# Patient Record
Sex: Male | Born: 1935 | Race: White | Hispanic: No | Marital: Married | Smoking: Never smoker
Health system: Southern US, Community
[De-identification: ages and names within clinical notes are randomized; demographics above are authoritative.]

## PROBLEM LIST (undated history)

## (undated) DIAGNOSIS — G4733 Obstructive sleep apnea (adult) (pediatric): Secondary | ICD-10-CM

## (undated) DIAGNOSIS — M1990 Unspecified osteoarthritis, unspecified site: Secondary | ICD-10-CM

## (undated) DIAGNOSIS — L409 Psoriasis, unspecified: Secondary | ICD-10-CM

## (undated) DIAGNOSIS — R011 Cardiac murmur, unspecified: Secondary | ICD-10-CM

## (undated) DIAGNOSIS — I10 Essential (primary) hypertension: Secondary | ICD-10-CM

## (undated) DIAGNOSIS — K219 Gastro-esophageal reflux disease without esophagitis: Secondary | ICD-10-CM

## (undated) DIAGNOSIS — G629 Polyneuropathy, unspecified: Secondary | ICD-10-CM

## (undated) DIAGNOSIS — Z9989 Dependence on other enabling machines and devices: Secondary | ICD-10-CM

## (undated) DIAGNOSIS — H3530 Unspecified macular degeneration: Secondary | ICD-10-CM

## (undated) DIAGNOSIS — C61 Malignant neoplasm of prostate: Secondary | ICD-10-CM

## (undated) DIAGNOSIS — F329 Major depressive disorder, single episode, unspecified: Secondary | ICD-10-CM

## (undated) DIAGNOSIS — T884XXA Failed or difficult intubation, initial encounter: Secondary | ICD-10-CM

## (undated) DIAGNOSIS — E119 Type 2 diabetes mellitus without complications: Secondary | ICD-10-CM

## (undated) DIAGNOSIS — F32A Depression, unspecified: Secondary | ICD-10-CM

## (undated) HISTORY — PX: LUMBAR SPINE SURGERY: SHX701

## (undated) HISTORY — PX: ROTATOR CUFF REPAIR: SHX139

## (undated) HISTORY — PX: REPLACEMENT TOTAL KNEE: SUR1224

## (undated) HISTORY — DX: Psoriasis, unspecified: L40.9

## (undated) HISTORY — PX: MOUTH SURGERY: SHX715

## (undated) HISTORY — PX: OTHER SURGICAL HISTORY: SHX169

## (undated) HISTORY — PX: PAIN PUMP IMPLANTATION: SHX330

## (undated) HISTORY — PX: CATARACT EXTRACTION W/ INTRAOCULAR LENS  IMPLANT, BILATERAL: SHX1307

## (undated) HISTORY — PX: TOTAL SHOULDER REPLACEMENT: SUR1217

## (undated) HISTORY — PX: SKIN CANCER EXCISION: SHX779

---

## 1971-06-25 DIAGNOSIS — C439 Malignant melanoma of skin, unspecified: Secondary | ICD-10-CM

## 1971-06-25 HISTORY — DX: Malignant melanoma of skin, unspecified: C43.9

## 2002-06-24 DIAGNOSIS — Z85828 Personal history of other malignant neoplasm of skin: Secondary | ICD-10-CM

## 2002-06-24 HISTORY — DX: Personal history of other malignant neoplasm of skin: Z85.828

## 2004-06-24 DIAGNOSIS — C4492 Squamous cell carcinoma of skin, unspecified: Secondary | ICD-10-CM

## 2004-06-24 HISTORY — DX: Squamous cell carcinoma of skin, unspecified: C44.92

## 2016-12-04 ENCOUNTER — Encounter: Payer: Self-pay | Admitting: *Deleted

## 2016-12-04 ENCOUNTER — Emergency Department: Payer: Medicare Other

## 2016-12-04 ENCOUNTER — Emergency Department
Admission: EM | Admit: 2016-12-04 | Discharge: 2016-12-04 | Disposition: A | Discharge status: Home / Self Care | Payer: Medicare Other | Attending: Emergency Medicine | Admitting: Emergency Medicine

## 2016-12-04 DIAGNOSIS — M25561 Pain in right knee: Secondary | ICD-10-CM | POA: Insufficient documentation

## 2016-12-04 DIAGNOSIS — E119 Type 2 diabetes mellitus without complications: Secondary | ICD-10-CM | POA: Diagnosis not present

## 2016-12-04 HISTORY — DX: Obstructive sleep apnea (adult) (pediatric): G47.33

## 2016-12-04 HISTORY — DX: Type 2 diabetes mellitus without complications: E11.9

## 2016-12-04 HISTORY — DX: Dependence on other enabling machines and devices: Z99.89

## 2016-12-04 MED ORDER — TRAMADOL HCL 50 MG PO TABS: 50.0000 mg | ORAL_TABLET | Freq: Two times a day (BID) | ORAL | 0 refills | Status: DC | PRN

## 2016-12-04 NOTE — ED Provider Notes (Signed)
[facility] Emergency Department Provider Note   ____________________________________________   First MD Initiated Contact with Patient 12/04/16 1031     (approximate)  I have reviewed the triage vital signs and the nursing notes.   HISTORY  Chief Complaint Knee Pain    HPI Andrew Obrien is a 82 y.o. male patient's right knee pain secondary to a fall last night. Patient states knees given. Patient said her right knee has had 2 surgeries but he continued to have laxity in these a cane for ambulation. Patient state he has been trying to get into orthopedics but that would not see him until his medical records arrive from [facility]. Patient rates his pain as a 10 over 10. Patient described a pain as "achy". No palliative measures for complaint. Past Medical History:  Diagnosis Date  . Diabetes mellitus without complication ([place])   . OSA on CPAP     There are no active problems to display for this patient.   Past Surgical History:  Procedure Laterality Date  . PAIN PUMP IMPLANTATION      Prior to Admission medications   Medication Sig Start Date End Date Taking? Authorizing Provider  traMADol (ULTRAM) 50 MG tablet Take 1 tablet (50 mg total) by mouth every 12 (twelve) hours as needed. 12/04/16   Sable Feil, PA-C    Allergies Patient has no known allergies.  No family history on file.  Social History Social History  Substance Use Topics  . Smoking status: Never Smoker  . Smokeless tobacco: Never Used  . Alcohol use No    Review of Systems  Constitutional: No fever/chills Eyes: No visual changes. ENT: No sore throat. Cardiovascular: Denies chest pain. Respiratory: Denies shortness of breath. Gastrointestinal: No abdominal pain.  No nausea, no vomiting.  No diarrhea.  No constipation. Genitourinary: Negative for dysuria. Musculoskeletal:Right hip and knee pain Skin: Negative for rash. Neurological: Negative for headaches,  focal weakness or numbness. Endocrine:Diabetes ____________________________________________   PHYSICAL EXAM:  VITAL SIGNS: ED Triage Vitals  Enc Vitals Group     BP 12/04/16 1010 (!) 126/59     Pulse Rate 12/04/16 1010 88     Resp 12/04/16 1010 18     Temp 12/04/16 1010 98.2 F (36.8 C)     Temp Source 12/04/16 1010 Oral     SpO2 12/04/16 1010 95 %     Weight 12/04/16 1008 217 lb (98.4 kg)     Height 12/04/16 1008 6' (1.829 m)     Head Circumference --      Peak Flow --      Pain Score 12/04/16 1007 10     Pain Loc --      Pain Edu? --      Excl. in [place]? --     Constitutional: Alert and oriented. Well appearing and in no acute distress. Neck: No stridor.  No cervical spine tenderness to palpation. Cardiovascular: Normal rate, regular rhythm. Grossly normal heart sounds.  Good peripheral circulation. Respiratory: Normal respiratory effort.  No retractions. Lungs CTAB. Musculoskeletal: No osseous deformities the right lower extremity. Moderate guarding palpation of the inferior patella. Patient has full equal range of motion sitting position.  Neurologic:  Normal speech and language. No gross focal neurologic deficits are appreciated. No gait instability. Skin:  Skin is warm, dry and intact. No rash noted. Abrasion anterior right patella Psychiatric: Mood and affect are normal. Speech and behavior are normal.  ____________________________________________   LABS (all labs ordered are  listed, but only abnormal results are displayed)  Labs Reviewed - No data to display ____________________________________________  EKG   ____________________________________________  RADIOLOGY  Dg Knee Complete 4 Views Right  Result Date: 12/04/2016 CLINICAL DATA:  Right knee gave out last night with resulting fall. Right knee and hip pain. History of right total knee arthroplasty. EXAM: RIGHT KNEE - COMPLETE 4+ VIEW COMPARISON:  None. FINDINGS: The bones appear mildly demineralized.  Patient is status post total knee arthroplasty. The hardware is intact without evidence of loosening. No evidence of acute fracture or dislocation. Possible small joint effusion on the lateral view. Mild vascular calcifications are noted. IMPRESSION: No acute osseous findings. Electronically Signed   By: Richardean Sale M.D.   On: 12/04/2016 11:02   Dg Hip Unilat W Or Wo Pelvis 2-3 Views Right  Result Date: 12/04/2016 CLINICAL DATA:  Fall last night.  Right hip and knee pain. EXAM: DG HIP (WITH OR WITHOUT PELVIS) 2-3V RIGHT COMPARISON:  None. FINDINGS: The bones appear mildly demineralized. No evidence of acute fracture or dislocation. There is advanced asymmetric right hip arthropathy with joint space narrowing, subchondral sclerosis and cyst formation. Lower lumbar spondylosis and diffuse vascular calcifications are noted. IMPRESSION: No acute osseous findings demonstrated. Advanced asymmetric right hip arthropathy. Electronically Signed   By: Richardean Sale M.D.   On: 12/04/2016 11:03    ___No acute findings x-ray of the right hip and knee. _________________________________________   PROCEDURES  Procedure(s) performed: None  Procedures  Critical Care performed: No  ____________________________________________   INITIAL IMPRESSION / ASSESSMENT AND PLAN / ED COURSE  Pertinent labs & imaging results that were available during my care of the patient were reviewed by me and considered in my medical decision making (see chart for details).  Knee pain secondary to contusion. Discussed x-ray findings with patient. Patient advised to follow-up with orthopedics as soon as possible.      ____________________________________________   FINAL CLINICAL IMPRESSION(S) / ED DIAGNOSES  Final diagnoses:  Acute pain of right knee      NEW MEDICATIONS STARTED DURING THIS VISIT:  New Prescriptions   TRAMADOL (ULTRAM) 50 MG TABLET    Take 1 tablet (50 mg total) by mouth every 12 (twelve)  hours as needed.     Note:  This document was prepared using Dragon voice recognition software and may include unintentional dictation errors.    Desiderio, Dol[place], PA-C 12/04/16 1122    Harvest Dark, MD 12/04/16 [phone]

## 2016-12-04 NOTE — ED Triage Notes (Signed)
States last night his right knee gave out and he fell on it, arrives with right knee pain, brace on knee, pt uses cane and walker at baseline, states hx of chronic knee pain for 5 years

## 2016-12-04 NOTE — Discharge Instructions (Signed)
Knee immobilizer for extended standing or walking.

## 2016-12-04 NOTE — ED Notes (Signed)
See triage note  States his right knee gave out and he fell onto right knee  No deformity noted but is wearing a brace

## 2017-03-03 DIAGNOSIS — E782 Mixed hyperlipidemia: Secondary | ICD-10-CM | POA: Insufficient documentation

## 2017-04-02 ENCOUNTER — Encounter
Admission: RE | Admit: 2017-04-02 | Discharge: 2017-04-02 | Disposition: A | Discharge status: Home / Self Care | Payer: Medicare Other | Source: Ambulatory Visit | Attending: Orthopedic Surgery | Admitting: Orthopedic Surgery

## 2017-04-02 DIAGNOSIS — Z01810 Encounter for preprocedural cardiovascular examination: Secondary | ICD-10-CM | POA: Insufficient documentation

## 2017-04-02 DIAGNOSIS — I10 Essential (primary) hypertension: Secondary | ICD-10-CM | POA: Diagnosis not present

## 2017-04-02 DIAGNOSIS — Z01812 Encounter for preprocedural laboratory examination: Secondary | ICD-10-CM | POA: Diagnosis not present

## 2017-04-02 HISTORY — DX: Depression, unspecified: F32.A

## 2017-04-02 HISTORY — DX: Gastro-esophageal reflux disease without esophagitis: K21.9

## 2017-04-02 HISTORY — DX: Major depressive disorder, single episode, unspecified: F32.9

## 2017-04-02 HISTORY — DX: Unspecified osteoarthritis, unspecified site: M19.90

## 2017-04-02 HISTORY — DX: Cardiac murmur, unspecified: R01.1

## 2017-04-02 HISTORY — DX: Polyneuropathy, unspecified: G62.9

## 2017-04-02 HISTORY — DX: Unspecified macular degeneration: H35.30

## 2017-04-02 HISTORY — DX: Essential (primary) hypertension: I10

## 2017-04-02 HISTORY — DX: Malignant neoplasm of prostate: C61

## 2017-04-02 LAB — TYPE AND SCREEN
ABO/RH(D): O POS
ANTIBODY SCREEN: NEGATIVE

## 2017-04-02 LAB — PROTIME-INR
INR: 0.96
Prothrombin Time: 12.7 seconds (ref 11.4–15.2)

## 2017-04-02 LAB — COMPREHENSIVE METABOLIC PANEL
ALK PHOS: 81 U/L (ref 38–126)
ALT: 22 U/L (ref 17–63)
ANION GAP: 11 (ref 5–15)
AST: 21 U/L (ref 15–41)
Albumin: 4 g/dL (ref 3.5–5.0)
BUN: 22 mg/dL — AB (ref 6–20)
CALCIUM: 9.3 mg/dL (ref 8.9–10.3)
CO2: 26 mmol/L (ref 22–32)
Chloride: 102 mmol/L (ref 101–111)
Creatinine, Ser: 1.02 mg/dL (ref 0.61–1.24)
GFR calc Af Amer: >60 mL/min (ref >60)
GFR calc non Af Amer: >60 mL/min (ref >60)
GLUCOSE: 122 mg/dL — AB (ref 65–99)
Potassium: 4.3 mmol/L (ref 3.5–5.1)
SODIUM: 139 mmol/L (ref 135–145)
TOTAL PROTEIN: 6.7 g/dL (ref 6.5–8.1)
Total Bilirubin: 0.5 mg/dL (ref 0.3–1.2)

## 2017-04-02 LAB — URINALYSIS, ROUTINE W REFLEX MICROSCOPIC
BILIRUBIN URINE: NEGATIVE
Glucose, UA: NEGATIVE mg/dL
HGB URINE DIPSTICK: NEGATIVE
Ketones, ur: NEGATIVE mg/dL
Leukocytes, UA: NEGATIVE
NITRITE: NEGATIVE
PROTEIN: NEGATIVE mg/dL
Specific Gravity, Urine: 1.015 (ref 1.005–1.030)
pH: 5 (ref 5.0–8.0)

## 2017-04-02 LAB — CBC
HCT: 36.9 % — ABNORMAL LOW (ref 40.0–52.0)
HEMOGLOBIN: 12.9 g/dL — AB (ref 13.0–18.0)
MCH: 31.6 pg (ref 26.0–34.0)
MCHC: 34.9 g/dL (ref 32.0–36.0)
MCV: 90.5 fL (ref 80.0–100.0)
Platelets: 228 10*3/uL (ref 150–440)
RBC: 4.07 MIL/uL — AB (ref 4.40–5.90)
RDW: 13.7 % (ref 11.5–14.5)
WBC: 6.7 10*3/uL (ref 3.8–10.6)

## 2017-04-02 LAB — HEMOGLOBIN A1C
Hgb A1c MFr Bld: 8 % — ABNORMAL HIGH (ref 4.8–5.6)
Mean Plasma Glucose: 182.9 mg/dL

## 2017-04-02 LAB — SEDIMENTATION RATE: Sed Rate: 25 mm/hr — ABNORMAL HIGH (ref 0–20)

## 2017-04-02 LAB — SURGICAL PCR SCREEN
MRSA, PCR: NEGATIVE
STAPHYLOCOCCUS AUREUS: POSITIVE — AB

## 2017-04-02 LAB — C-REACTIVE PROTEIN: CRP: 1.1 mg/dL — ABNORMAL HIGH (ref <1.0)

## 2017-04-02 LAB — APTT: APTT: 33 s (ref 24–36)

## 2017-04-02 NOTE — Patient Instructions (Addendum)
Your procedure is scheduled on: 04/14/17 Mon Report to Same Day Surgery 2nd floor medical mall [facility] Entrance-take elevator on left to 2nd floor.  Check in with surgery information desk.) To find out your arrival time please call [phone] between 1PM - 3PM on 04/11/17 Fri  Remember: Instructions that are not followed completely may result in serious medical risk, up to and including death, or upon the discretion of your surgeon and anesthesiologist your surgery may need to be rescheduled.    _x___ 1. Do not eat food after midnight the night before your procedure. You may drink clear liquids up to 2 hours before you are scheduled to arrive at the hospital for your procedure.  Do not drink clear liquids within 2 hours of your scheduled arrival to the hospital.  Clear liquids include  --Water or Apple juice without pulp  --Clear carbohydrate beverage such as ClearFast or Gatorade  --Black Coffee or Clear Tea (No milk, no creamers, do not add anything to                  the coffee or Tea Type 1 and type 2 diabetics should only drink water.  No gum chewing or hard candies.     __x__ 2. No Alcohol for 24 hours before or after surgery.   __x__3. No Smoking for 24 prior to surgery.   ____  4. Bring all medications with you on the day of surgery if instructed.    __x__ 5. Notify your doctor if there is any change in your medical condition     (cold, fever, infections).     Do not wear jewelry, make-up, hairpins, clips or nail polish.  Do not wear lotions, powders, or perfumes. You may wear deodorant.  Do not shave 48 hours prior to surgery. Men may shave face and neck.  Do not bring valuables to the hospital.    [facility] is not responsible for any belongings or valuables.               Contacts, dentures or bridgework may not be worn into surgery.  Leave your suitcase in the car. After surgery it may be brought to your room.  For patients admitted to the hospital, discharge  time is determined by your                       treatment team.   Patients discharged the day of surgery will not be allowed to drive home.  You will need someone to drive you home and stay with you the night of your procedure.    Please read over the following fact sheets that you were given:   [facility] Preparing for Surgery and or MRSA Information   _x___ Take anti-hypertensive listed below, cardiac, seizure, asthma,     anti-reflux and psychiatric medicines. These include:  1. DULoxetine (CYMBALTA) 30 MG capsule  2.omeprazole (PRILOSEC) 20 MG capsule  3.pregabalin (LYRICA) 100 MG capsule  4.  5.  6.  ____Fleets enema or Magnesium Citrate as directed.   _x___ Use CHG Soap or sage wipes as directed on instruction sheet   ____ Use inhalers on the day of surgery and bring to hospital day of surgery  __x__ Stop Metformin and Janumet 2 days prior to surgery.    ____ Take 1/2 of usual insulin dose the night before surgery and none on the morning     surgery.   _x___ Follow recommendations from Cardiologist,  Pulmonologist or PCP regarding          stopping Aspirin, Coumadin, Plavix ,Eliquis, Effient, or Pradaxa, and Pletal.  Stop Aspirin 1 week before surgery.  X____Stop Anti-inflammatories such as Advil, Aleve, Ibuprofen, Motrin, Naproxen, Naprosyn, Goodies powders or aspirin products. OK to take Tylenol and                          Celebrex.   _x___ Stop supplements until after surgery.  But may continue Vitamin D, Vitamin B,       and multivitamin.   ____ Bring C-Pap to the hospital.

## 2017-04-03 LAB — URINE CULTURE: Special Requests: NORMAL

## 2017-04-03 NOTE — Pre-Procedure Instructions (Signed)
hgbaic faxed to dr hooten's

## 2017-04-14 ENCOUNTER — Inpatient Hospital Stay: Payer: Medicare Other | Admitting: Anesthesiology

## 2017-04-14 ENCOUNTER — Inpatient Hospital Stay
Admission: RE | Admit: 2017-04-14 | Discharge: 2017-04-17 | DRG: 470 | Disposition: A | Discharge status: Skilled Nursing Facility | Payer: Medicare Other | Attending: Orthopedic Surgery | Admitting: Orthopedic Surgery

## 2017-04-14 ENCOUNTER — Encounter
Admission: RE | Disposition: A | Discharge status: Skilled Nursing Facility | Payer: Self-pay | Source: Home / Self Care | Attending: Orthopedic Surgery

## 2017-04-14 ENCOUNTER — Inpatient Hospital Stay: Payer: Medicare Other

## 2017-04-14 DIAGNOSIS — F329 Major depressive disorder, single episode, unspecified: Secondary | ICD-10-CM | POA: Diagnosis present

## 2017-04-14 DIAGNOSIS — G4733 Obstructive sleep apnea (adult) (pediatric): Secondary | ICD-10-CM | POA: Diagnosis present

## 2017-04-14 DIAGNOSIS — Z800 Family history of malignant neoplasm of digestive organs: Secondary | ICD-10-CM | POA: Diagnosis not present

## 2017-04-14 DIAGNOSIS — I10 Essential (primary) hypertension: Secondary | ICD-10-CM | POA: Diagnosis present

## 2017-04-14 DIAGNOSIS — Z8522 Personal history of malignant neoplasm of nasal cavities, middle ear, and accessory sinuses: Secondary | ICD-10-CM | POA: Diagnosis not present

## 2017-04-14 DIAGNOSIS — Z7951 Long term (current) use of inhaled steroids: Secondary | ICD-10-CM | POA: Diagnosis not present

## 2017-04-14 DIAGNOSIS — Z96649 Presence of unspecified artificial hip joint: Secondary | ICD-10-CM

## 2017-04-14 DIAGNOSIS — E1140 Type 2 diabetes mellitus with diabetic neuropathy, unspecified: Secondary | ICD-10-CM | POA: Diagnosis present

## 2017-04-14 DIAGNOSIS — K219 Gastro-esophageal reflux disease without esophagitis: Secondary | ICD-10-CM | POA: Diagnosis present

## 2017-04-14 DIAGNOSIS — M1611 Unilateral primary osteoarthritis, right hip: Principal | ICD-10-CM | POA: Diagnosis present

## 2017-04-14 DIAGNOSIS — Z961 Presence of intraocular lens: Secondary | ICD-10-CM | POA: Diagnosis present

## 2017-04-14 DIAGNOSIS — Z9852 Vasectomy status: Secondary | ICD-10-CM | POA: Diagnosis not present

## 2017-04-14 DIAGNOSIS — Z85818 Personal history of malignant neoplasm of other sites of lip, oral cavity, and pharynx: Secondary | ICD-10-CM

## 2017-04-14 DIAGNOSIS — H3530 Unspecified macular degeneration: Secondary | ICD-10-CM | POA: Diagnosis present

## 2017-04-14 DIAGNOSIS — R451 Restlessness and agitation: Secondary | ICD-10-CM | POA: Diagnosis not present

## 2017-04-14 DIAGNOSIS — Z7984 Long term (current) use of oral hypoglycemic drugs: Secondary | ICD-10-CM

## 2017-04-14 DIAGNOSIS — M549 Dorsalgia, unspecified: Secondary | ICD-10-CM | POA: Diagnosis not present

## 2017-04-14 DIAGNOSIS — Z85828 Personal history of other malignant neoplasm of skin: Secondary | ICD-10-CM | POA: Diagnosis not present

## 2017-04-14 DIAGNOSIS — Z9841 Cataract extraction status, right eye: Secondary | ICD-10-CM

## 2017-04-14 DIAGNOSIS — Z9842 Cataract extraction status, left eye: Secondary | ICD-10-CM | POA: Diagnosis not present

## 2017-04-14 DIAGNOSIS — R011 Cardiac murmur, unspecified: Secondary | ICD-10-CM | POA: Diagnosis present

## 2017-04-14 DIAGNOSIS — Z8249 Family history of ischemic heart disease and other diseases of the circulatory system: Secondary | ICD-10-CM | POA: Diagnosis not present

## 2017-04-14 DIAGNOSIS — Z8549 Personal history of malignant neoplasm of other male genital organs: Secondary | ICD-10-CM

## 2017-04-14 DIAGNOSIS — Z8546 Personal history of malignant neoplasm of prostate: Secondary | ICD-10-CM

## 2017-04-14 DIAGNOSIS — Z79899 Other long term (current) drug therapy: Secondary | ICD-10-CM

## 2017-04-14 DIAGNOSIS — M25551 Pain in right hip: Secondary | ICD-10-CM | POA: Diagnosis present

## 2017-04-14 DIAGNOSIS — Z7982 Long term (current) use of aspirin: Secondary | ICD-10-CM | POA: Diagnosis not present

## 2017-04-14 HISTORY — DX: Failed or difficult intubation, initial encounter: T88.4XXA

## 2017-04-14 HISTORY — PX: TOTAL HIP ARTHROPLASTY: SHX124

## 2017-04-14 LAB — GLUCOSE, CAPILLARY
GLUCOSE-CAPILLARY: 153 mg/dL — AB (ref 65–99)
GLUCOSE-CAPILLARY: 258 mg/dL — AB (ref 65–99)
Glucose-Capillary: 150 mg/dL — ABNORMAL HIGH (ref 65–99)
Glucose-Capillary: 194 mg/dL — ABNORMAL HIGH (ref 65–99)
Glucose-Capillary: 194 mg/dL — ABNORMAL HIGH (ref 65–99)

## 2017-04-14 LAB — ABO/RH: ABO/RH(D): O POS

## 2017-04-14 SURGERY — ARTHROPLASTY, HIP, TOTAL,POSTERIOR APPROACH
Anesthesia: General | Site: Hip | Laterality: Right | Wound class: Clean

## 2017-04-14 MED ORDER — DEXAMETHASONE SODIUM PHOSPHATE 4 MG/ML IJ SOLN
INTRAMUSCULAR | Status: DC | PRN
  Administered 2017-04-14: 5 mg via INTRAVENOUS

## 2017-04-14 MED ORDER — AZELASTINE-FLUTICASONE 137-50 MCG/ACT NA SUSP: 1.0000 | Freq: Every day | NASAL | Status: DC

## 2017-04-14 MED ORDER — OXYCODONE HCL 5 MG PO TABS
10.0000 mg | ORAL_TABLET | ORAL | Status: DC | PRN
  Administered 2017-04-14 – 2017-04-16 (×7): 10 mg via ORAL
  Filled 2017-04-14 (×7): qty 2

## 2017-04-14 MED ORDER — REMIFENTANIL HCL 1 MG IV SOLR
INTRAVENOUS | Status: DC | PRN
  Administered 2017-04-14: .03 ug/kg/min via INTRAVENOUS

## 2017-04-14 MED ORDER — FERROUS SULFATE 325 (65 FE) MG PO TABS
325.0000 mg | ORAL_TABLET | Freq: Two times a day (BID) | ORAL | Status: DC
  Administered 2017-04-14 – 2017-04-17 (×6): 325 mg via ORAL
  Filled 2017-04-14 (×6): qty 1

## 2017-04-14 MED ORDER — PHENYLEPHRINE HCL 10 MG/ML IJ SOLN
INTRAMUSCULAR | Status: DC | PRN
  Administered 2017-04-14 (×3): 50 ug via INTRAVENOUS
  Administered 2017-04-14: 100 ug via INTRAVENOUS
  Administered 2017-04-14: 50 ug via INTRAVENOUS
  Administered 2017-04-14: 100 ug via INTRAVENOUS
  Administered 2017-04-14: 50 ug via INTRAVENOUS

## 2017-04-14 MED ORDER — INSULIN ASPART 100 UNIT/ML ~~LOC~~ SOLN
0.0000 [IU] | Freq: Three times a day (TID) | SUBCUTANEOUS | Status: DC
  Administered 2017-04-14: 8 [IU] via SUBCUTANEOUS
  Administered 2017-04-15: 2 [IU] via SUBCUTANEOUS
  Administered 2017-04-15: 3 [IU] via SUBCUTANEOUS
  Administered 2017-04-16 – 2017-04-17 (×4): 2 [IU] via SUBCUTANEOUS
  Filled 2017-04-14 (×7): qty 1

## 2017-04-14 MED ORDER — MIDAZOLAM HCL 2 MG/2ML IJ SOLN
INTRAMUSCULAR | Status: AC
  Filled 2017-04-14: qty 2

## 2017-04-14 MED ORDER — OXYCODONE HCL 5 MG PO TABS
5.0000 mg | ORAL_TABLET | ORAL | Status: DC | PRN
  Administered 2017-04-14 – 2017-04-17 (×8): 5 mg via ORAL
  Filled 2017-04-14 (×9): qty 1

## 2017-04-14 MED ORDER — MORPHINE SULFATE (PF) 2 MG/ML IV SOLN: 2.0000 mg | INTRAVENOUS | Status: DC | PRN

## 2017-04-14 MED ORDER — REMIFENTANIL HCL 1 MG IV SOLR
INTRAVENOUS | Status: DC | PRN
  Administered 2017-04-14 (×2): 12.5 ug via INTRAVENOUS

## 2017-04-14 MED ORDER — ROPINIROLE HCL 1 MG PO TABS
1.0000 mg | ORAL_TABLET | Freq: Every day | ORAL | Status: DC
  Administered 2017-04-14 – 2017-04-16 (×3): 1 mg via ORAL
  Filled 2017-04-14 (×3): qty 1

## 2017-04-14 MED ORDER — PREGABALIN 50 MG PO CAPS
100.0000 mg | ORAL_CAPSULE | Freq: Two times a day (BID) | ORAL | Status: DC
  Administered 2017-04-14 – 2017-04-17 (×6): 100 mg via ORAL
  Filled 2017-04-14 (×6): qty 2

## 2017-04-14 MED ORDER — PROPOFOL 10 MG/ML IV BOLUS
INTRAVENOUS | Status: DC | PRN
  Administered 2017-04-14: 100 mg via INTRAVENOUS

## 2017-04-14 MED ORDER — ALUM & MAG HYDROXIDE-SIMETH 200-200-20 MG/5ML PO SUSP: 30.0000 mL | ORAL | Status: DC | PRN

## 2017-04-14 MED ORDER — ACETAMINOPHEN 10 MG/ML IV SOLN
INTRAVENOUS | Status: DC | PRN
  Administered 2017-04-14: 1000 mg via INTRAVENOUS

## 2017-04-14 MED ORDER — ACETAMINOPHEN 10 MG/ML IV SOLN
1000.0000 mg | Freq: Four times a day (QID) | INTRAVENOUS | Status: AC
  Administered 2017-04-14 – 2017-04-15 (×4): 1000 mg via INTRAVENOUS
  Filled 2017-04-14 (×4): qty 100

## 2017-04-14 MED ORDER — OXYCODONE HCL 5 MG/5ML PO SOLN: 5.0000 mg | Freq: Once | ORAL | Status: DC | PRN

## 2017-04-14 MED ORDER — HYDROCHLOROTHIAZIDE 12.5 MG PO CAPS
12.5000 mg | ORAL_CAPSULE | Freq: Every day | ORAL | Status: DC
Start: 2017-04-14 — End: 2017-04-17
  Administered 2017-04-14 – 2017-04-17 (×4): 12.5 mg via ORAL
  Filled 2017-04-14 (×4): qty 1

## 2017-04-14 MED ORDER — PANTOPRAZOLE SODIUM 40 MG PO TBEC
40.0000 mg | DELAYED_RELEASE_TABLET | Freq: Two times a day (BID) | ORAL | Status: DC
  Administered 2017-04-14 – 2017-04-17 (×6): 40 mg via ORAL
  Filled 2017-04-14 (×6): qty 1

## 2017-04-14 MED ORDER — DEXTROSE 5 % IV SOLN
2000.0000 mg | INTRAVENOUS | Status: DC
  Filled 2017-04-14: qty 20

## 2017-04-14 MED ORDER — SODIUM CHLORIDE 0.9 % IV SOLN
INTRAVENOUS | Status: DC
  Administered 2017-04-14 (×2): via INTRAVENOUS

## 2017-04-14 MED ORDER — AZELASTINE HCL 0.1 % NA SOLN
1.0000 | Freq: Two times a day (BID) | NASAL | Status: DC
  Administered 2017-04-14 – 2017-04-17 (×8): 1 via NASAL
  Filled 2017-04-14: qty 30

## 2017-04-14 MED ORDER — LIDOCAINE HCL (PF) 2 % IJ SOLN
INTRAMUSCULAR | Status: AC
  Filled 2017-04-14: qty 10

## 2017-04-14 MED ORDER — NEOMYCIN-POLYMYXIN B GU 40-200000 IR SOLN
Status: AC
  Filled 2017-04-14: qty 20

## 2017-04-14 MED ORDER — REMIFENTANIL HCL 1 MG IV SOLR
INTRAVENOUS | Status: AC
  Filled 2017-04-14: qty 1000

## 2017-04-14 MED ORDER — ACETAMINOPHEN 325 MG PO TABS: 650.0000 mg | ORAL_TABLET | ORAL | Status: DC | PRN

## 2017-04-14 MED ORDER — FENTANYL CITRATE (PF) 100 MCG/2ML IJ SOLN
INTRAMUSCULAR | Status: AC
  Administered 2017-04-14: 25 ug via INTRAVENOUS
  Filled 2017-04-14: qty 2

## 2017-04-14 MED ORDER — TAMSULOSIN HCL 0.4 MG PO CAPS
0.4000 mg | ORAL_CAPSULE | Freq: Every day | ORAL | Status: DC
  Administered 2017-04-14 – 2017-04-17 (×4): 0.4 mg via ORAL
  Filled 2017-04-14 (×4): qty 1

## 2017-04-14 MED ORDER — FENTANYL CITRATE (PF) 100 MCG/2ML IJ SOLN
INTRAMUSCULAR | Status: AC
Start: 2017-04-14 — End: 2017-04-14
  Administered 2017-04-14: 25 ug via INTRAVENOUS
  Filled 2017-04-14: qty 2

## 2017-04-14 MED ORDER — ROSUVASTATIN CALCIUM 10 MG PO TABS
10.0000 mg | ORAL_TABLET | Freq: Every evening | ORAL | Status: DC
  Administered 2017-04-14 – 2017-04-16 (×3): 10 mg via ORAL
  Filled 2017-04-14 (×2): qty 1

## 2017-04-14 MED ORDER — ENOXAPARIN SODIUM 30 MG/0.3ML ~~LOC~~ SOLN
30.0000 mg | Freq: Two times a day (BID) | SUBCUTANEOUS | Status: DC
  Administered 2017-04-15 – 2017-04-17 (×5): 30 mg via SUBCUTANEOUS
  Filled 2017-04-14 (×5): qty 0.3

## 2017-04-14 MED ORDER — DIPHENHYDRAMINE HCL 12.5 MG/5ML PO ELIX: 12.5000 mg | ORAL_SOLUTION | ORAL | Status: DC | PRN

## 2017-04-14 MED ORDER — NEOMYCIN-POLYMYXIN B GU 40-200000 IR SOLN
Status: DC | PRN
  Administered 2017-04-14: 16 mL

## 2017-04-14 MED ORDER — ACETAMINOPHEN 650 MG RE SUPP: 650.0000 mg | RECTAL | Status: DC | PRN

## 2017-04-14 MED ORDER — SODIUM CHLORIDE 0.9 % IV SOLN
INTRAVENOUS | Status: DC
  Administered 2017-04-15: 01:00:00 via INTRAVENOUS

## 2017-04-14 MED ORDER — MIDAZOLAM HCL 2 MG/2ML IJ SOLN
INTRAMUSCULAR | Status: DC | PRN
  Administered 2017-04-14: 0.5 mg via INTRAVENOUS

## 2017-04-14 MED ORDER — SODIUM CHLORIDE 0.9 % IV SOLN
1000.0000 mg | INTRAVENOUS | Status: AC
  Administered 2017-04-14: 1000 mg via INTRAVENOUS
  Filled 2017-04-14: qty 10

## 2017-04-14 MED ORDER — FLEET ENEMA 7-19 GM/118ML RE ENEM: 1.0000 | ENEMA | Freq: Once | RECTAL | Status: DC | PRN

## 2017-04-14 MED ORDER — CHLORHEXIDINE GLUCONATE 4 % EX LIQD: 60.0000 mL | Freq: Once | CUTANEOUS | Status: DC

## 2017-04-14 MED ORDER — TRAMADOL HCL 50 MG PO TABS: 50.0000 mg | ORAL_TABLET | ORAL | Status: DC | PRN

## 2017-04-14 MED ORDER — PHENYLEPHRINE HCL 10 MG/ML IJ SOLN
INTRAMUSCULAR | Status: AC
  Filled 2017-04-14: qty 1

## 2017-04-14 MED ORDER — IRBESARTAN 150 MG PO TABS
300.0000 mg | ORAL_TABLET | Freq: Every day | ORAL | Status: DC
  Administered 2017-04-14 – 2017-04-17 (×4): 300 mg via ORAL
  Filled 2017-04-14 (×4): qty 2

## 2017-04-14 MED ORDER — MIRABEGRON ER 50 MG PO TB24
50.0000 mg | ORAL_TABLET | Freq: Every day | ORAL | Status: DC
  Administered 2017-04-15 – 2017-04-17 (×3): 50 mg via ORAL
  Filled 2017-04-14 (×4): qty 1

## 2017-04-14 MED ORDER — CEFAZOLIN SODIUM-DEXTROSE 2-4 GM/100ML-% IV SOLN
INTRAVENOUS | Status: AC
  Filled 2017-04-14: qty 100

## 2017-04-14 MED ORDER — FENTANYL CITRATE (PF) 100 MCG/2ML IJ SOLN
25.0000 ug | INTRAMUSCULAR | Status: AC | PRN
  Administered 2017-04-14 (×6): 25 ug via INTRAVENOUS

## 2017-04-14 MED ORDER — PHENYLEPHRINE HCL 10 MG/ML IJ SOLN
INTRAVENOUS | Status: DC | PRN
  Administered 2017-04-14: 20 ug/min via INTRAVENOUS

## 2017-04-14 MED ORDER — FLUOCINOLONE ACETONIDE 0.01 % EX SOLN: 1.0000 [drp] | Freq: Two times a day (BID) | CUTANEOUS | Status: DC | PRN

## 2017-04-14 MED ORDER — SUCCINYLCHOLINE CHLORIDE 20 MG/ML IJ SOLN
INTRAMUSCULAR | Status: AC
  Filled 2017-04-14: qty 1

## 2017-04-14 MED ORDER — ACETAMINOPHEN 10 MG/ML IV SOLN
INTRAVENOUS | Status: AC
  Filled 2017-04-14: qty 100

## 2017-04-14 MED ORDER — SUCCINYLCHOLINE CHLORIDE 20 MG/ML IJ SOLN
INTRAMUSCULAR | Status: DC | PRN
  Administered 2017-04-14: 100 mg via INTRAVENOUS

## 2017-04-14 MED ORDER — TRANEXAMIC ACID 1000 MG/10ML IV SOLN
1000.0000 mg | Freq: Once | INTRAVENOUS | Status: AC
  Administered 2017-04-14: 1000 mg via INTRAVENOUS
  Filled 2017-04-14: qty 10

## 2017-04-14 MED ORDER — VITAMIN D 1000 UNITS PO TABS
1000.0000 [IU] | ORAL_TABLET | Freq: Two times a day (BID) | ORAL | Status: DC
  Administered 2017-04-14 – 2017-04-17 (×6): 1000 [IU] via ORAL
  Filled 2017-04-14 (×5): qty 1

## 2017-04-14 MED ORDER — ADULT MULTIVITAMIN W/MINERALS CH
1.0000 | ORAL_TABLET | Freq: Every day | ORAL | Status: DC
  Administered 2017-04-15 – 2017-04-17 (×3): 1 via ORAL
  Filled 2017-04-14 (×3): qty 1

## 2017-04-14 MED ORDER — SUGAMMADEX SODIUM 200 MG/2ML IV SOLN
INTRAVENOUS | Status: DC | PRN
  Administered 2017-04-14: 200 mg via INTRAVENOUS

## 2017-04-14 MED ORDER — POTASSIUM 99 MG PO TABS: 1.0000 | ORAL_TABLET | Freq: Every day | ORAL | Status: DC

## 2017-04-14 MED ORDER — TEMAZEPAM 7.5 MG PO CAPS
7.5000 mg | ORAL_CAPSULE | Freq: Every day | ORAL | Status: DC
  Administered 2017-04-14 – 2017-04-16 (×3): 7.5 mg via ORAL
  Filled 2017-04-14 (×4): qty 1

## 2017-04-14 MED ORDER — CEFAZOLIN SODIUM-DEXTROSE 2-4 GM/100ML-% IV SOLN: 2.0000 g | INTRAVENOUS | Status: DC

## 2017-04-14 MED ORDER — DULOXETINE HCL 30 MG PO CPEP
30.0000 mg | ORAL_CAPSULE | Freq: Two times a day (BID) | ORAL | Status: DC
  Administered 2017-04-14 – 2017-04-17 (×6): 30 mg via ORAL
  Filled 2017-04-14 (×7): qty 1

## 2017-04-14 MED ORDER — SENNOSIDES-DOCUSATE SODIUM 8.6-50 MG PO TABS
1.0000 | ORAL_TABLET | Freq: Two times a day (BID) | ORAL | Status: DC
  Administered 2017-04-14 – 2017-04-17 (×6): 1 via ORAL
  Filled 2017-04-14 (×7): qty 1

## 2017-04-14 MED ORDER — ROCURONIUM BROMIDE 100 MG/10ML IV SOLN
INTRAVENOUS | Status: DC | PRN
  Administered 2017-04-14: 50 mg via INTRAVENOUS

## 2017-04-14 MED ORDER — KETAMINE HCL 50 MG/ML IJ SOLN
INTRAMUSCULAR | Status: AC
  Filled 2017-04-14: qty 10

## 2017-04-14 MED ORDER — VITAMIN C 500 MG PO TABS
500.0000 mg | ORAL_TABLET | Freq: Every day | ORAL | Status: DC
  Administered 2017-04-15 – 2017-04-17 (×3): 500 mg via ORAL
  Filled 2017-04-14 (×4): qty 1

## 2017-04-14 MED ORDER — PROPOFOL 10 MG/ML IV BOLUS
INTRAVENOUS | Status: AC
  Filled 2017-04-14: qty 20

## 2017-04-14 MED ORDER — CALCIUM CARBONATE-VITAMIN D 500-200 MG-UNIT PO TABS
1.0000 | ORAL_TABLET | Freq: Two times a day (BID) | ORAL | Status: DC
  Administered 2017-04-14 – 2017-04-17 (×6): 1 via ORAL
  Filled 2017-04-14 (×5): qty 1

## 2017-04-14 MED ORDER — CEFAZOLIN SODIUM-DEXTROSE 2-4 GM/100ML-% IV SOLN: 2.0000 g | Freq: Four times a day (QID) | INTRAVENOUS | Status: DC

## 2017-04-14 MED ORDER — ONDANSETRON HCL 4 MG PO TABS: 4.0000 mg | ORAL_TABLET | Freq: Four times a day (QID) | ORAL | Status: DC | PRN

## 2017-04-14 MED ORDER — METFORMIN HCL 500 MG PO TABS
1000.0000 mg | ORAL_TABLET | Freq: Two times a day (BID) | ORAL | Status: DC
Start: 2017-04-14 — End: 2017-04-17
  Administered 2017-04-14 – 2017-04-17 (×6): 1000 mg via ORAL
  Filled 2017-04-14 (×7): qty 2

## 2017-04-14 MED ORDER — OLMESARTAN MEDOXOMIL-HCTZ 40-12.5 MG PO TABS: 1.0000 | ORAL_TABLET | Freq: Every day | ORAL | Status: DC

## 2017-04-14 MED ORDER — FENTANYL CITRATE (PF) 100 MCG/2ML IJ SOLN
INTRAMUSCULAR | Status: AC
  Filled 2017-04-14: qty 2

## 2017-04-14 MED ORDER — ALLOPURINOL 300 MG PO TABS
300.0000 mg | ORAL_TABLET | Freq: Every day | ORAL | Status: DC
  Administered 2017-04-15 – 2017-04-17 (×3): 300 mg via ORAL
  Filled 2017-04-14 (×4): qty 1

## 2017-04-14 MED ORDER — MAGNESIUM HYDROXIDE 400 MG/5ML PO SUSP
30.0000 mL | Freq: Every day | ORAL | Status: DC | PRN
  Administered 2017-04-16 – 2017-04-17 (×2): 30 mL via ORAL
  Filled 2017-04-14 (×3): qty 30

## 2017-04-14 MED ORDER — FENTANYL CITRATE (PF) 100 MCG/2ML IJ SOLN
INTRAMUSCULAR | Status: DC | PRN
Start: 2017-04-14 — End: 2017-04-14
  Administered 2017-04-14: 100 ug via INTRAVENOUS

## 2017-04-14 MED ORDER — CALCIPOTRIENE 0.005 % EX CREA: 1.0000 "application " | TOPICAL_CREAM | Freq: Two times a day (BID) | CUTANEOUS | Status: DC

## 2017-04-14 MED ORDER — KETAMINE HCL 50 MG/ML IJ SOLN
INTRAMUSCULAR | Status: DC | PRN
  Administered 2017-04-14: 50 mg via INTRAVENOUS

## 2017-04-14 MED ORDER — METOCLOPRAMIDE HCL 10 MG PO TABS
10.0000 mg | ORAL_TABLET | Freq: Three times a day (TID) | ORAL | Status: AC
  Administered 2017-04-14 – 2017-04-16 (×7): 10 mg via ORAL
  Filled 2017-04-14 (×5): qty 1

## 2017-04-14 MED ORDER — BISACODYL 10 MG RE SUPP
10.0000 mg | Freq: Every day | RECTAL | Status: DC | PRN
  Administered 2017-04-17: 10 mg via RECTAL
  Filled 2017-04-14: qty 1

## 2017-04-14 MED ORDER — ROCURONIUM BROMIDE 50 MG/5ML IV SOLN
INTRAVENOUS | Status: AC
  Filled 2017-04-14: qty 1

## 2017-04-14 MED ORDER — OXYCODONE HCL 5 MG PO TABS: 5.0000 mg | ORAL_TABLET | Freq: Once | ORAL | Status: DC | PRN

## 2017-04-14 MED ORDER — PHENOL 1.4 % MT LIQD
1.0000 | OROMUCOSAL | Status: DC | PRN
  Filled 2017-04-14: qty 177

## 2017-04-14 MED ORDER — LIDOCAINE HCL (CARDIAC) 20 MG/ML IV SOLN
INTRAVENOUS | Status: DC | PRN
  Administered 2017-04-14: 100 mg via INTRAVENOUS

## 2017-04-14 MED ORDER — CEFAZOLIN SODIUM-DEXTROSE 2-4 GM/100ML-% IV SOLN
2.0000 g | INTRAVENOUS | Status: AC
  Administered 2017-04-14: 2 g via INTRAVENOUS

## 2017-04-14 MED ORDER — MENTHOL 3 MG MT LOZG
1.0000 | LOZENGE | OROMUCOSAL | Status: DC | PRN
  Filled 2017-04-14: qty 9

## 2017-04-14 MED ORDER — FLUTICASONE PROPIONATE 50 MCG/ACT NA SUSP
2.0000 | Freq: Every day | NASAL | Status: DC
  Administered 2017-04-14 – 2017-04-16 (×3): 2 via NASAL
  Filled 2017-04-14: qty 16

## 2017-04-14 MED ORDER — ONDANSETRON HCL 4 MG/2ML IJ SOLN: 4.0000 mg | Freq: Four times a day (QID) | INTRAMUSCULAR | Status: DC | PRN

## 2017-04-14 MED ORDER — SUGAMMADEX SODIUM 200 MG/2ML IV SOLN
INTRAVENOUS | Status: AC
  Filled 2017-04-14: qty 2

## 2017-04-14 MED ORDER — DEXTROSE 5 % IV SOLN
2.0000 g | Freq: Four times a day (QID) | INTRAVENOUS | Status: AC
  Administered 2017-04-14 – 2017-04-15 (×4): 2 g via INTRAVENOUS
  Filled 2017-04-14 (×4): qty 2000

## 2017-04-14 MED ORDER — ONDANSETRON HCL 4 MG/2ML IJ SOLN
INTRAMUSCULAR | Status: DC | PRN
  Administered 2017-04-14: 4 mg via INTRAVENOUS

## 2017-04-14 SURGICAL SUPPLY — 51 items
BLADE DRUM FLTD (BLADE) ×3 IMPLANT
BLADE SAW 1 (BLADE) ×3 IMPLANT
CANISTER SUCT 1200ML W/VALVE (MISCELLANEOUS) ×3 IMPLANT
CANISTER SUCT 3000ML PPV (MISCELLANEOUS) ×6 IMPLANT
CAPT HIP TOTAL 2 ×3 IMPLANT
CARTRIDGE OIL MAESTRO DRILL (MISCELLANEOUS) ×1 IMPLANT
CATH FOL LEG HOLDER (MISCELLANEOUS) ×3 IMPLANT
CATH TRAY METER 16FR LF (MISCELLANEOUS) ×3 IMPLANT
DIFFUSER MAESTRO (MISCELLANEOUS) ×3 IMPLANT
DRAPE INCISE IOBAN 66X60 STRL (DRAPES) ×3 IMPLANT
DRAPE SHEET LG 3/4 BI-LAMINATE (DRAPES) ×3 IMPLANT
DRSG DERMACEA 8X12 NADH (GAUZE/BANDAGES/DRESSINGS) ×3 IMPLANT
DRSG OPSITE POSTOP 4X12 (GAUZE/BANDAGES/DRESSINGS) ×3 IMPLANT
DRSG OPSITE POSTOP 4X14 (GAUZE/BANDAGES/DRESSINGS) IMPLANT
DRSG TEGADERM 4X4.75 (GAUZE/BANDAGES/DRESSINGS) ×3 IMPLANT
DURAPREP 26ML APPLICATOR (WOUND CARE) ×3 IMPLANT
ELECT BLADE 6.5 EXT (BLADE) ×3 IMPLANT
ELECT CAUTERY BLADE 6.4 (BLADE) ×3 IMPLANT
EVACUATOR 1/8 PVC DRAIN (DRAIN) ×3 IMPLANT
GLOVE BIOGEL M STRL SZ7.5 (GLOVE) ×6 IMPLANT
GLOVE BIOGEL PI IND STRL 9 (GLOVE) ×1 IMPLANT
GLOVE BIOGEL PI INDICATOR 9 (GLOVE) ×2
GLOVE INDICATOR 8.0 STRL GRN (GLOVE) ×3 IMPLANT
GLOVE SURG SYN 9.0  PF PI (GLOVE) ×2
GLOVE SURG SYN 9.0 PF PI (GLOVE) ×1 IMPLANT
GOWN STRL REUS W/ TWL LRG LVL3 (GOWN DISPOSABLE) ×2 IMPLANT
GOWN STRL REUS W/TWL 2XL LVL3 (GOWN DISPOSABLE) ×3 IMPLANT
GOWN STRL REUS W/TWL LRG LVL3 (GOWN DISPOSABLE) ×4
HOOD PEEL AWAY FLYTE STAYCOOL (MISCELLANEOUS) ×6 IMPLANT
KIT RM TURNOVER STRD PROC AR (KITS) ×3 IMPLANT
NDL SAFETY 18GX1.5 (NEEDLE) ×3 IMPLANT
NS IRRIG 500ML POUR BTL (IV SOLUTION) ×3 IMPLANT
OIL CARTRIDGE MAESTRO DRILL (MISCELLANEOUS) ×3
PACK HIP PROSTHESIS (MISCELLANEOUS) ×3 IMPLANT
PULSAVAC PLUS IRRIG FAN TIP (DISPOSABLE) ×3
SOL .9 NS 3000ML IRR  AL (IV SOLUTION) ×2
SOL .9 NS 3000ML IRR UROMATIC (IV SOLUTION) ×1 IMPLANT
SOL PREP PVP 2OZ (MISCELLANEOUS) ×3
SOLUTION PREP PVP 2OZ (MISCELLANEOUS) ×1 IMPLANT
SPONGE DRAIN TRACH 4X4 STRL 2S (GAUZE/BANDAGES/DRESSINGS) ×3 IMPLANT
STAPLER SKIN PROX 35W (STAPLE) ×3 IMPLANT
SUT ETHIBOND #5 BRAIDED 30INL (SUTURE) ×3 IMPLANT
SUT VIC AB 0 CT1 36 (SUTURE) ×3 IMPLANT
SUT VIC AB 1 CT1 36 (SUTURE) ×6 IMPLANT
SUT VIC AB 2-0 CT1 27 (SUTURE) ×2
SUT VIC AB 2-0 CT1 TAPERPNT 27 (SUTURE) ×1 IMPLANT
SYR 20CC LL (SYRINGE) ×3 IMPLANT
TAPE ADH 3 LX (MISCELLANEOUS) ×3 IMPLANT
TAPE TRANSPORE STRL 2 31045 (GAUZE/BANDAGES/DRESSINGS) ×3 IMPLANT
TIP FAN IRRIG PULSAVAC PLUS (DISPOSABLE) ×1 IMPLANT
TOWEL OR 17X26 4PK STRL BLUE (TOWEL DISPOSABLE) ×3 IMPLANT

## 2017-04-14 NOTE — Evaluation (Signed)
Physical Therapy Evaluation Patient Details Name: Andrew Obrien MRN: [id number] DOB: [birth year] Today's Date: 04/14/2017   History of Present Illness  Pt is an 82 yo M diagnosed with degenerative arthrosis of the righthip who is now s/p elective righttotal hip arthroplasty.  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      Clinical Impression  Pt presents with deficits in strength, transfers, mobility, gait, balance, and activity tolerance.  Pt required min A for sup to sit to assist RLE out of bed and to ensure hip precaution compliance.  Pt required CGA with transfers from elevated EOB with verbal and visual cues for proper sequencing to ensure R hip flex </= 90 deg.  Pt able to amb forwards, backwards, and side-stepping for a total of around 8' with RW and CGA.  Patient required verbal and visual cues to ensure no closed kinetic chain R hip IR during 90 deg turn to the R to recliner chair.  Pt's SpO2 and HR WNL throughout session with no c/o adverse symptoms.  Overall pt performed well during session especially considering POD #0 status.  Pt will benefit from PT services in a SNF setting upon discharge to safely address above deficits for decreased caregiver assistance and eventual return to PLOF.         Follow Up Recommendations SNF    Equipment Recommendations  None recommended by PT    Recommendations for Other Services       Precautions / Restrictions Precautions Precautions: Fall;Posterior Hip Precaution Booklet Issued: Yes (comment) Precaution Comments: Posterior hip precaution education provided and booklet reviewed Required Braces or Orthoses: Other Brace/Splint Other Brace/Splint: Pt wears speacial shoes with ankle bracing and a lift for leg length discrepancy correction Restrictions Weight Bearing Restrictions: Yes RLE Weight Bearing: Weight bearing as tolerated      Mobility  Bed  Mobility Overal bed mobility: Needs Assistance Bed Mobility: Supine to Sit     Supine to sit: Min assist     General bed mobility comments: Effortful with sup to sit with min A for RLE out of bed and to ensure post hip precautions maintained  Transfers Overall transfer level: Needs assistance Equipment used: Rolling walker (2 wheeled) Transfers: Sit to/from Stand Sit to Stand: Min guard         General transfer comment: Mod verbal cues for sequencing during sit to/from stand transfers from elevated surfaces to ensure post hip precautions maintained.  Ambulation/Gait Ambulation/Gait assistance: Min guard Ambulation Distance (Feet): 8 Feet Assistive device: Rolling walker (2 wheeled) Gait Pattern/deviations: Step-to pattern   Gait velocity interpretation: Below normal speed for age/gender General Gait Details: Mod verbal cues for sequencing during amb with practice making 90 deg R turn to ensure no CKC R hip IR  Stairs            Wheelchair Mobility    Modified Rankin (Stroke Patients Only)       Balance Overall balance assessment: Needs assistance Sitting-balance support: Feet unsupported;Feet supported;No upper extremity supported;Bilateral upper extremity supported Sitting balance-Leahy Scale: Normal     Standing balance support: Bilateral upper extremity supported Standing balance-Leahy Scale: Good                               Pertinent Vitals/Pain Pain Assessment: 0-10 Pain Score: 6  Pain Location: R hip Pain Descriptors / Indicators:  Operative site guarding;Sore Pain Intervention(s): Premedicated before session;Monitored during session;Limited activity within patient's tolerance    Home Living Family/patient expects to be discharged to:: Private residence Living Arrangements: Spouse/significant other Available Help at Discharge: Family;Available 24 hours/day Type of Home: Other(Comment) (Apt at [facility] facility) Home Access:  Level entry     Home Layout: One level Home Equipment: Gilford Rile - 2 wheels;Cane - single point      Prior Function Level of Independence: Independent with assistive device(s)         Comments: Pt Mod I with amb with SPC limited community distances secondary to R hip pain, no fall history     Hand Dominance   Dominant Hand: Right    Extremity/Trunk Assessment   Upper Extremity Assessment Upper Extremity Assessment: Overall WFL for tasks assessed    Lower Extremity Assessment Lower Extremity Assessment: Generalized weakness;RLE deficits/detail RLE Deficits / Details: BLE sensation to light touch grossly intact with good B ankle AROM and DF strength 4+/5; RLE strength not formally tested secondary to pain RLE: Unable to fully assess due to pain       Communication   Communication: HOH  Cognition Arousal/Alertness: Awake/alert Behavior During Therapy: WFL for tasks assessed/performed Overall Cognitive Status: Within Functional Limits for tasks assessed                                 General Comments: Pt A&O x 4      General Comments      Exercises Total Joint Exercises Ankle Circles/Pumps: AROM;Strengthening;Both;5 reps;10 reps Quad Sets: Strengthening;Both;10 reps Gluteal Sets: Strengthening;Both;10 reps Towel Squeeze: Strengthening;Both;10 reps Long Arc Quad: AROM;Both;10 reps Knee Flexion: AROM;Both;10 reps Marching in Standing: AROM;Both;10 reps Other Exercises Other Exercises: HEP education per handout Other Exercises: Extensive posterior hip precaution education with booklet provided including practical applications during funtional mobility   Assessment/Plan    PT Assessment Patient needs continued PT services  PT Problem List Decreased strength;Decreased activity tolerance;Decreased balance;Decreased mobility;Decreased knowledge of use of DME       PT Treatment Interventions DME instruction;Gait training;Functional mobility  training;Neuromuscular re-education;Balance training;Therapeutic exercise;Therapeutic activities;Patient/family education    PT Goals (Current goals can be found in the Care Plan section)  Acute Rehab PT Goals Patient Stated Goal: To walk better PT Goal Formulation: With patient Time For Goal Achievement: 04/27/17 Potential to Achieve Goals: Good    Frequency BID   Barriers to discharge        Co-evaluation               AM-PAC PT "6 Clicks" Daily Activity  Outcome Measure Difficulty turning over in bed (including adjusting bedclothes, sheets and blankets)?: Unable Difficulty moving from lying on back to sitting on the side of the bed? : Unable Difficulty sitting down on and standing up from a chair with arms (e.g., wheelchair, bedside commode, etc,.)?: Unable Help needed moving to and from a bed to chair (including a wheelchair)?: A Little Help needed walking in hospital room?: A Little Help needed climbing 3-5 steps with a railing? : A Lot 6 Click Score: 11    End of Session Equipment Utilized During Treatment: Gait belt Activity Tolerance: Patient tolerated treatment well;No increased pain Patient left: in chair;with chair alarm set;with call bell/phone within reach;Other (comment) (Abd pillow in place; per nursing, ok to leave SCDs off at end of session) Nurse Communication: Mobility status PT Visit Diagnosis: Other abnormalities of gait  and mobility (R26.89);Muscle weakness (generalized) (M62.81)    Time: 8250-0370 PT Time Calculation (min) (ACUTE ONLY): 59 min   Charges:   PT Evaluation $PT Eval Low Complexity: 1 Low PT Treatments $Gait Training: 8-22 mins $Therapeutic Exercise: 8-22 mins $Therapeutic Activity: 8-22 mins   PT G Codes:   PT G-Codes **NOT FOR INPATIENT CLASS** Functional Assessment Tool Used: AM-PAC 6 Clicks Basic Mobility Functional Limitation: Mobility: Walking and moving around Mobility: Walking and Moving Around Current Status (W8889):  At least 60 percent but less than 80 percent impaired, limited or restricted Mobility: Walking and Moving Around Goal Status [phone]): At least 1 percent but less than 20 percent impaired, limited or restricted    D. Scott Lavonya Hoerner PT, DPT 04/14/17, 3:45 PM

## 2017-04-14 NOTE — OR Nursing (Signed)
Dr Marry Guan asked to assess area of erythema in right groin area.  Positive PCR screen for Staph reported to MD.  No new orders at this time.

## 2017-04-14 NOTE — H&P (Signed)
The patient has been re-examined, and the chart reviewed, and there have been no interval changes to the documented history and physical.    The risks, benefits, and alternatives have been discussed at length. The patient expressed understanding of the risks benefits and agreed with plans for surgical intervention.  James P. Hooten, Jr. M.D.    

## 2017-04-14 NOTE — Anesthesia Postprocedure Evaluation (Signed)
Anesthesia Post Note  Patient: Andrew Obrien  Procedure(s) Performed: TOTAL HIP ARTHROPLASTY (Right Hip)  Patient location during evaluation: PACU Anesthesia Type: General Level of consciousness: awake and alert Pain management: pain level controlled Vital Signs Assessment: post-procedure vital signs reviewed and stable Respiratory status: spontaneous breathing, nonlabored ventilation, respiratory function stable and patient connected to nasal cannula oxygen Cardiovascular status: blood pressure returned to baseline and stable Postop Assessment: no apparent nausea or vomiting Anesthetic complications: no     Last Vitals:  Vitals:   04/14/17 1152 04/14/17 1212  BP: 133/74 139/62  Pulse: 83 85  Resp: 17 16  Temp: (!) 36.4 C 36.5 C  SpO2: 97% 93%    Last Pain:  Vitals:   04/14/17 1212  TempSrc: Oral  PainSc:                  Precious Haws Linna Thebeau

## 2017-04-14 NOTE — Anesthesia Procedure Notes (Signed)
Procedure Name: Intubation Date/Time: 04/14/2017 7:33 AM Performed by: Rosaria Ferries, Mahathi Pokorney Pre-anesthesia Checklist: Patient identified, Emergency Drugs available, Suction available and Patient being monitored Patient Re-evaluated:Patient Re-evaluated prior to induction Oxygen Delivery Method: Circle system utilized Preoxygenation: Pre-oxygenation with 100% oxygen Induction Type: IV induction Laryngoscope Size: Miller and 3 Grade View: Grade II Tube size: 7.0 mm Number of attempts: 1 Airway Equipment and Method: Bougie stylet Placement Confirmation: positive ETCO2 and breath sounds checked- equal and bilateral Secured at: 23 cm Tube secured with: Tape Dental Injury: Teeth and Oropharynx as per pre-operative assessment  Difficulty Due To: Difficulty was anticipated and Difficult Airway- due to anterior larynx Future Recommendations: Recommend- induction with short-acting agent, and alternative techniques readily available

## 2017-04-14 NOTE — OR Nursing (Signed)
Anesthesia made aware that patient has pain stimulator placed in lower back.

## 2017-04-14 NOTE — Transfer of Care (Signed)
Immediate Anesthesia Transfer of Care Note  Patient: Andrew Obrien  Procedure(s) Performed: TOTAL HIP ARTHROPLASTY (Right Hip)  Patient Location: PACU  Anesthesia Type:General  Level of Consciousness: awake and patient cooperative  Airway & Oxygen Therapy: Patient Spontanous Breathing and Patient connected to face mask oxygen  Post-op Assessment: Report given to RN and Post -op Vital signs reviewed and stable  Post vital signs: Reviewed and stable  Last Vitals:  Vitals:   04/14/17 0607  BP: (!) 132/53  Pulse: 80  Resp: 16  Temp: (!) 36.1 C  SpO2: 97%    Last Pain:  Vitals:   04/14/17 0607  TempSrc: Tympanic         Complications: No apparent anesthesia complications

## 2017-04-14 NOTE — Op Note (Signed)
OPERATIVE NOTE  DATE OF SURGERY:  04/14/2017  PATIENT NAME:  Andrew Obrien   DOB: 01/08/1935  MRN: [id number]  PRE-OPERATIVE DIAGNOSIS: Degenerative arthrosis of the right hip, primary  POST-OPERATIVE DIAGNOSIS:  Same  PROCEDURE:  Right total hip arthroplasty  SURGEON:  Marciano Sequin. M.D.  ASSISTANT:  Vance Peper, PA (present and scrubbed throughout the case, critical for assistance with exposure, retraction, instrumentation, and closure)  ANESTHESIA: general  ESTIMATED BLOOD LOSS: 100 mL  FLUIDS REPLACED: 1250 mL of crystalloid  DRAINS: 2 medium drains to a Hemovac reservoir  IMPLANTS UTILIZED: DePuy 13.5 mm large stature AML femoral stem, 56 mm OD Pinnacle 100 acetabular component, neutral Pinnacle Marathon polyethylene insert, and a 36 mm M-SPEC +1.5 mm hip ball  INDICATIONS FOR SURGERY: Andrew Obrien is a 82 y.o. year old male with a long history of progressive hip and groin  pain. X-rays demonstrated severe degenerative changes. The patient had not seen any significant improvement despite conservative nonsurgical intervention. After discussion of the risks and benefits of surgical intervention, the patient expressed understanding of the risks benefits and agree with plans for total hip arthroplasty.   The risks, benefits, and alternatives were discussed at length including but not limited to the risks of infection, bleeding, nerve injury, stiffness, blood clots, the need for revision surgery, limb length inequality, dislocation, cardiopulmonary complications, among others, and they were willing to proceed.  PROCEDURE IN DETAIL: The patient was brought into the operating room and, after adequate general anesthesia was achieved, the patient was placed in a left lateral decubitus position. Axillary roll was placed and all bony prominences were well-padded. The patient's right hip was cleaned and prepped with alcohol and DuraPrep and draped in the usual sterile fashion. A  "timeout" was performed as per usual protocol. A lateral curvilinear incision was made gently curving towards the posterior superior iliac spine. The IT band was incised in line with the skin incision and the fibers of the gluteus maximus were split in line. The piriformis tendon was identified, skeletonized, and incised at its insertion to the proximal femur and reflected posteriorly. A T type posterior capsulotomy was performed. Prior to dislocation of the femoral head, a threaded Steinmann pin was inserted through a separate stab incision into the pelvis superior to the acetabulum and bent in the form of a stylus so as to assess limb length and hip offset throughout the procedure. The femoral head was then dislocated posteriorly. Inspection of the femoral head demonstrated severe degenerative changes with full-thickness loss of articular cartilage. The femoral neck cut was performed using an oscillating saw. The anterior capsule was elevated off of the femoral neck using a periosteal elevator. Attention was then directed to the acetabulum. The remnant of the labrum was excised using electrocautery. Inspection of the acetabulum also demonstrated significant degenerative changes. The acetabulum was reamed in sequential fashion up to a 55 mm diameter. Good punctate bleeding bone was encountered. A 56 mm Pinnacle 100 acetabular component was positioned and impacted into place. Good scratch fit was appreciated. A neutral polyethylene trial was inserted.  Attention was then directed to the proximal femur. A hole for reaming of the proximal femoral canal was created using a high-speed burr. The femoral canal was reamed in sequential fashion up to a 13.5 mm diameter. This allowed for approximately 5 cm of line to line scratch fit. Serial broaches were inserted up to a 13.5 mm large stature femoral broach. Calcar region was planed and a trial reduction was  performed using a 36 mm hip ball with a +1.5 mm neck length.  Good equalization of limb lengths and hip offset was appreciated and excellent stability was noted both anteriorly and posteriorly. Trial components were removed. The acetabular shell was irrigated with copious amounts of normal saline with antibiotic solution and suctioned dry. A neutral Pinnacle Marathon polyethylene insert was positioned and impacted into place. Next, a 13.5 mm large stature AML femoral stem was positioned and impacted into place. Excellent scratch fit was appreciated. A trial reduction was again performed with a 36 mm hip ball with a +1.5 mm neck length. Again, good equalization of limb lengths was appreciated and excellent stability appreciated both anteriorly and posteriorly. The hip was then dislocated and the trial hip ball was removed. The Morse taper was cleaned and dried. A 36 mm M-SPEC hip ball with a +1.5 mm neck length was placed on the trunnion and impacted into place. The hip was then reduced and placed through range of motion. Excellent stability was appreciated both anteriorly and posteriorly.  The wound was irrigated with copious amounts of normal saline with antibiotic solution and suctioned dry. Good hemostasis was appreciated. The posterior capsulotomy was repaired using #5 Ethibond. Piriformis tendon was reapproximated to the undersurface of the gluteus medius tendon using #5 Ethibond. Two medium drains were placed in the wound bed and brought out through separate stab incisions to be attached to a Hemovac reservoir. The IT band was reapproximated using interrupted sutures of #1 Vicryl. Subcutaneous tissue was approximated using first #0 Vicryl followed by #2-0 Vicryl. The skin was closed with skin staples.  The patient tolerated the procedure well and was transported to the recovery room in stable condition.   Marciano Sequin., M.D.

## 2017-04-14 NOTE — Anesthesia Post-op Follow-up Note (Signed)
Anesthesia QCDR form completed.        

## 2017-04-14 NOTE — Anesthesia Preprocedure Evaluation (Signed)
Anesthesia Evaluation  Patient identified by MRN, date of birth, ID band Patient awake    Reviewed: Allergy & Precautions, H&P , NPO status , Patient's Chart, lab work & pertinent test results  History of Anesthesia Complications (+) DIFFICULT AIRWAYNegative for: history of anesthetic complications  Airway Mallampati: III  TM Distance: <3 FB Neck ROM: limited    Dental  (+) Poor Dentition, Chipped, Missing   Pulmonary neg shortness of breath, sleep apnea ,           Cardiovascular Exercise Tolerance: Good hypertension, (-) angina(-) Past MI + Valvular Problems/Murmurs      Neuro/Psych PSYCHIATRIC DISORDERS Depression negative neurological ROS     GI/Hepatic Neg liver ROS, GERD  Controlled and Medicated,  Endo/Other  diabetes, Type 2  Renal/GU      Musculoskeletal  (+) Arthritis ,   Abdominal   Peds  Hematology negative hematology ROS (+)   Anesthesia Other Findings Past Medical History: No date: Arthritis No date: Depression No date: Diabetes mellitus without complication (HCC) No date: Difficult intubation No date: GERD (gastroesophageal reflux disease) No date: Heart murmur No date: Hypertension No date: Macular degeneration     Comment:  Right No date: Neuropathy No date: OSA on CPAP No date: Prostate cancer ([place])  Past Surgical History: No date: CATARACT EXTRACTION W/ INTRAOCULAR LENS  IMPLANT, BILATERAL No date: LUMBAR SPINE SURGERY No date: MOUTH SURGERY No date: PAIN PUMP IMPLANTATION No date: prostate cancer No date: REPLACEMENT TOTAL KNEE; Right     Comment:  x 2 No date: ROTATOR CUFF REPAIR; Left No date: SKIN CANCER EXCISION No date: TOTAL SHOULDER REPLACEMENT; Left  BMI    Body Mass Index:  29.70 kg/m      Reproductive/Obstetrics negative OB ROS                             Anesthesia Physical Anesthesia Plan  ASA: III  Anesthesia Plan: General ETT    Post-op Pain Management:    Induction: Intravenous  PONV Risk Score and Plan: 2 and Ondansetron and Dexamethasone  Airway Management Planned: Oral ETT  Additional Equipment:   Intra-op Plan:   Post-operative Plan: Extubation in OR  Informed Consent: I have reviewed the patients History and Physical, chart, labs and discussed the procedure including the risks, benefits and alternatives for the proposed anesthesia with the patient or authorized representative who has indicated his/her understanding and acceptance.   Dental Advisory Given  Plan Discussed with: Anesthesiologist, CRNA and Surgeon  Anesthesia Plan Comments: (Patient has spinal cord stimulator which he reports is currently turned off so plan for GA  Patient consented for risks of anesthesia including but not limited to:  - adverse reactions to medications - damage to teeth, lips or other oral mucosa - sore throat or hoarseness - Damage to heart, brain, lungs or loss of life  Patient voiced understanding.)        Anesthesia Quick Evaluation

## 2017-04-14 NOTE — NC FL2 (Signed)
[place] LEVEL OF CARE SCREENING TOOL     IDENTIFICATION  Patient Name: Abundio Teuscher Birthdate: Jan 25, 1935 Sex: male Admission Date (Current Location): 04/14/2017  [place] and [place] Number:  Engineering geologist and Address:  [facility], [address] [place], [place] [address]      Provider Number: [id number]  Attending Physician Name and Address:  Dereck Leep, MD  Relative Name and Phone Number:       Current Level of Care: Hospital Recommended Level of Care: [place] Prior Approval Number:    Date Approved/Denied:   PASRR Number:  [id number])  Discharge Plan: SNF    Current Diagnoses: Patient Active Problem List   Diagnosis Date Noted  . Status post total replacement of hip 04/14/2017    Orientation RESPIRATION BLADDER Height & Weight     Time, Self, Situation, Place  Normal Continent Weight: 220 lb (99.8 kg) Height:  6' (182.9 cm)  BEHAVIORAL SYMPTOMS/MOOD NEUROLOGICAL BOWEL NUTRITION STATUS      Continent Diet (Diet: Clear Liquid to be Advanced. )  AMBULATORY STATUS COMMUNICATION OF NEEDS Skin   Extensive Assist Verbally Surgical wounds (Incision: Right Hip. )                       Personal Care Assistance Level of Assistance  Bathing, Feeding, Dressing Bathing Assistance: Limited assistance Feeding assistance: Independent Dressing Assistance: Limited assistance     Functional Limitations Info  Sight, Hearing, Speech Sight Info: Impaired Hearing Info: Impaired Speech Info: Adequate    SPECIAL CARE FACTORS FREQUENCY  PT (By licensed PT), OT (By licensed OT)     PT Frequency:  (5) OT Frequency:  (5)            Contractures      Additional Factors Info  Code Status, Allergies Code Status Info:  (Full Code. ) Allergies Info:  (No Known Allergies. )           Current Medications (04/14/2017):  This is the current hospital active medication list Current  Facility-Administered Medications  Medication Dose Route Frequency Provider Last Rate Last Dose  . 0.9 %  sodium chloride infusion   Intravenous Continuous Hooten, Laurice Record, MD      . acetaminophen (OFIRMEV) IV 1,000 mg  1,000 mg Intravenous Q6H Hooten, Laurice Record, MD   Stopped at 04/14/17 1400  . acetaminophen (TYLENOL) tablet 650 mg  650 mg Oral Q4H PRN Hooten, Laurice Record, MD       Or  . acetaminophen (TYLENOL) suppository 650 mg  650 mg Rectal Q4H PRN Hooten, Laurice Record, MD      . allopurinol (ZYLOPRIM) tablet 300 mg  300 mg Oral Daily Hooten, Laurice Record, MD      . alum & mag hydroxide-simeth (MAALOX/MYLANTA) 200-200-20 MG/5ML suspension 30 mL  30 mL Oral Q4H PRN Hooten, Laurice Record, MD      . azelastine (ASTELIN) 0.1 % nasal spray 1 spray  1 spray Each Nare BID Hooten, Laurice Record, MD   1 spray at 04/14/17 1531  . Azelastine-Fluticasone 137-50 MCG/ACT SUSP 1 spray  1 spray Nasal QHS Hooten, Laurice Record, MD      . bisacodyl (DULCOLAX) suppository 10 mg  10 mg Rectal Daily PRN Hooten, Laurice Record, MD      . calcipotriene (DOVONOX) 3.557 % cream 1 application  1 application Topical BID Hooten, Laurice Record, MD      . calcium-vitamin D (OSCAL WITH  D) 500-200 MG-UNIT per tablet 1 tablet  1 tablet Oral BID Hooten, Laurice Record, MD      . ceFAZolin (ANCEF) 2 g in dextrose 5 % 100 mL IVPB  2 g Intravenous Q6H Hooten, Laurice Record, MD   Stopped at 04/14/17 1417  . cholecalciferol (VITAMIN D) tablet 1,000 Units  1,000 Units Oral BID Hooten, Laurice Record, MD      . diphenhydrAMINE (BENADRYL) 12.5 MG/5ML elixir 12.5-25 mg  12.5-25 mg Oral Q4H PRN Hooten, Laurice Record, MD      . DULoxetine (CYMBALTA) DR capsule 30 mg  30 mg Oral BID Hooten, Laurice Record, MD      . Derrill Memo ON 04/15/2017] enoxaparin (LOVENOX) injection 30 mg  30 mg Subcutaneous Q12H Hooten, Laurice Record, MD      . ferrous sulfate tablet 325 mg  325 mg Oral BID WC Hooten, Laurice Record, MD      . fluocinolone (SYNALAR) 0.01 % external solution 1 drop  1 drop Topical BID PRN Hooten, Laurice Record, MD      .  fluticasone (FLONASE) 50 MCG/ACT nasal spray 2 spray  2 spray Each Nare QHS Hooten, Laurice Record, MD      . irbesartan (AVAPRO) tablet 300 mg  300 mg Oral Daily Hooten, Laurice Record, MD   300 mg at 04/14/17 1520   And  . hydrochlorothiazide (MICROZIDE) capsule 12.5 mg  12.5 mg Oral Daily Hooten, Laurice Record, MD   12.5 mg at 04/14/17 1520  . insulin aspart (novoLOG) injection 0-15 Units  0-15 Units Subcutaneous TID WC Hooten, Laurice Record, MD      . magnesium hydroxide (MILK OF MAGNESIA) suspension 30 mL  30 mL Oral Daily PRN Hooten, Laurice Record, MD      . menthol-cetylpyridinium (CEPACOL) lozenge 3 mg  1 lozenge Oral PRN Hooten, Laurice Record, MD       Or  . phenol (CHLORASEPTIC) mouth spray 1 spray  1 spray Mouth/Throat PRN Hooten, Laurice Record, MD      . metFORMIN (GLUCOPHAGE) tablet 1,000 mg  1,000 mg Oral BID WC Hooten, Laurice Record, MD      . metoCLOPramide (REGLAN) tablet 10 mg  10 mg Oral TID AC & HS Hooten, Laurice Record, MD      . mirabegron ER (MYRBETRIQ) tablet 50 mg  50 mg Oral Daily Hooten, Laurice Record, MD      . morphine 2 MG/ML injection 2 mg  2 mg Intravenous Q2H PRN Hooten, Laurice Record, MD      . multivitamin with minerals tablet 1 tablet  1 tablet Oral Daily Hooten, Laurice Record, MD      . ondansetron (ZOFRAN) tablet 4 mg  4 mg Oral Q6H PRN Hooten, Laurice Record, MD       Or  . ondansetron (ZOFRAN) injection 4 mg  4 mg Intravenous Q6H PRN Hooten, Laurice Record, MD      . oxyCODONE (Oxy IR/ROXICODONE) immediate release tablet 10 mg  10 mg Oral Q3H PRN Hooten, Laurice Record, MD      . oxyCODONE (Oxy IR/ROXICODONE) immediate release tablet 5 mg  5 mg Oral Q3H PRN Hooten, Laurice Record, MD   5 mg at 04/14/17 1521  . pantoprazole (PROTONIX) EC tablet 40 mg  40 mg Oral BID Hooten, Laurice Record, MD      . Potassium TABS 99 mg  1 tablet Oral Daily Hooten, Laurice Record, MD      . pregabalin (LYRICA) capsule 100 mg  100 mg Oral  BID Hooten, Laurice Record, MD      . rOPINIRole (REQUIP) tablet 1 mg  1 mg Oral QHS Hooten, Laurice Record, MD      . rosuvastatin (CRESTOR) tablet 10 mg  10 mg  Oral QPM Hooten, Laurice Record, MD      . senna-docusate (Senokot-S) tablet 1 tablet  1 tablet Oral BID Hooten, Laurice Record, MD      . sodium phosphate (FLEET) 7-19 GM/118ML enema 1 enema  1 enema Rectal Once PRN Hooten, Laurice Record, MD      . tamsulosin (FLOMAX) capsule 0.4 mg  0.4 mg Oral Daily Hooten, Laurice Record, MD   0.4 mg at 04/14/17 1520  . temazepam (RESTORIL) capsule 7.5 mg  7.5 mg Oral QHS Hooten, Laurice Record, MD      . traMADol Veatrice Bourbon) tablet 50-100 mg  50-100 mg Oral Q4H PRN Hooten, Laurice Record, MD      . vitamin C (ASCORBIC ACID) tablet 500 mg  500 mg Oral Daily Hooten, Laurice Record, MD         Discharge Medications: Please see discharge summary for a list of discharge medications.  Relevant Imaging Results:  Relevant Lab Results:   Additional Information  (SSN: [id number])  Eloise Picone, Veronia Beets, LCSW

## 2017-04-15 LAB — BASIC METABOLIC PANEL
Anion gap: 10 (ref 5–15)
BUN: 19 mg/dL (ref 6–20)
CO2: 25 mmol/L (ref 22–32)
CREATININE: 1.03 mg/dL (ref 0.61–1.24)
Calcium: 8.6 mg/dL — ABNORMAL LOW (ref 8.9–10.3)
Chloride: 104 mmol/L (ref 101–111)
Glucose, Bld: 135 mg/dL — ABNORMAL HIGH (ref 65–99)
Potassium: 4.5 mmol/L (ref 3.5–5.1)
SODIUM: 139 mmol/L (ref 135–145)

## 2017-04-15 LAB — GLUCOSE, CAPILLARY
GLUCOSE-CAPILLARY: 183 mg/dL — AB (ref 65–99)
GLUCOSE-CAPILLARY: 150 mg/dL — AB (ref 65–99)
GLUCOSE-CAPILLARY: 160 mg/dL — AB (ref 65–99)
Glucose-Capillary: 99 mg/dL (ref 65–99)

## 2017-04-15 LAB — CBC
HCT: 37.8 % — ABNORMAL LOW (ref 40.0–52.0)
Hemoglobin: 12.7 g/dL — ABNORMAL LOW (ref 13.0–18.0)
MCH: 30.9 pg (ref 26.0–34.0)
MCHC: 33.7 g/dL (ref 32.0–36.0)
MCV: 91.7 fL (ref 80.0–100.0)
PLATELETS: 229 10*3/uL (ref 150–440)
RBC: 4.12 MIL/uL — AB (ref 4.40–5.90)
RDW: 14.1 % (ref 11.5–14.5)
WBC: 11.1 10*3/uL — ABNORMAL HIGH (ref 3.8–10.6)

## 2017-04-15 MED ORDER — TRAMADOL HCL 50 MG PO TABS: 50.0000 mg | ORAL_TABLET | ORAL | 0 refills | Status: DC | PRN

## 2017-04-15 MED ORDER — ENOXAPARIN SODIUM 40 MG/0.4ML ~~LOC~~ SOLN: 40.0000 mg | SUBCUTANEOUS | 0 refills | Status: DC

## 2017-04-15 MED ORDER — OXYCODONE HCL 5 MG PO TABS: 5.0000 mg | ORAL_TABLET | ORAL | 0 refills | Status: DC | PRN

## 2017-04-15 MED ORDER — POTASSIUM CHLORIDE CRYS ER 10 MEQ PO TBCR
10.0000 meq | EXTENDED_RELEASE_TABLET | Freq: Every day | ORAL | Status: DC
  Administered 2017-04-15 – 2017-04-17 (×3): 10 meq via ORAL
  Filled 2017-04-15 (×4): qty 1

## 2017-04-15 MED ORDER — ACETAMINOPHEN 650 MG RE SUPP
650.0000 mg | RECTAL | Status: DC | PRN
Start: 2017-04-15 — End: 2017-04-17

## 2017-04-15 MED ORDER — TRAMADOL HCL 50 MG PO TABS: 50.0000 mg | ORAL_TABLET | ORAL | Status: DC | PRN

## 2017-04-15 MED ORDER — ACETAMINOPHEN 325 MG PO TABS: 650.0000 mg | ORAL_TABLET | ORAL | Status: DC | PRN

## 2017-04-15 NOTE — Clinical Social Work Placement (Signed)
   CLINICAL SOCIAL WORK PLACEMENT  NOTE  Date:  04/15/2017  Patient Details  Name: Andrew Obrien MRN: [id number] Date of Birth: [birth year]  Clinical Social Work is seeking post-discharge placement for this patient at the [place] level of care (*CSW will initial, date and re-position this form in  chart as items are completed):  Yes   Patient/family provided with [facility] Work Department's list of facilities offering this level of care within the geographic area requested by the patient (or if unable, by the patient's family).  Yes   Patient/family informed of their freedom to choose among providers that offer the needed level of care, that participate in Medicare, Medicaid or managed care program needed by the patient, have an available bed and are willing to accept the patient.  Yes   Patient/family informed of [facility]'s ownership interest in [facility] and [facility], as well as of the fact that they are under no obligation to receive care at these facilities.  PASRR submitted to EDS on 04/14/17     PASRR number received on 04/14/17     Existing PASRR number confirmed on       FL2 transmitted to all facilities in geographic area requested by pt/family on 04/15/17     FL2 transmitted to all facilities within larger geographic area on       Patient informed that his/her managed care company has contracts with or will negotiate with certain facilities, including the following:        Yes   Patient/family informed of bed offers received.  Patient chooses bed at  [facility] )     Physician recommends and patient chooses bed at      Patient to be transferred to   on  .  Patient to be transferred to facility by       Patient family notified on   of transfer.  Name of family member notified:        PHYSICIAN       Additional Comment:    _______________________________________________ Shadow Schedler, Veronia Beets,  LCSW 04/15/2017, 8:59 AM

## 2017-04-15 NOTE — Progress Notes (Signed)
Pt requested to stay in chair overnight. VSS. Dsg dry and intact with hemovac in place. Pain controlled. Incentive spirometer teaching done. Will continue to follow

## 2017-04-15 NOTE — Progress Notes (Signed)
Physical Therapy Treatment Patient Details Name: Andrew Obrien MRN: [id number] DOB: [birth year] Today's Date: 04/15/2017    History of Present Illness Pt is an 82 yo M diagnosed with degenerative arthrosis of the righthip who is now s/p elective righttotal hip arthroplasty.  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      PT Comments    Pt presents with deficits in strength, transfers, mobility, gait, balance, and activity tolerance but continues to progress towards goals.  Pt SBA now with sit to sup bed mobility with cues for proper sequencing to ensure hip precaution compliance.  Pt CGA with transfers and continues to require verbal cues for proper sequencing to ensure R hip flex < 90 deg.  Pt able to amb 80' with one standing rest break with slow cadence with decreased LLE step length with cues for upright posture and amb closer to RW.   Pt continues to require verbal and visual cues for sequencing during R turns to avoid closed kinetic chain R hip IR.  Pt will benefit from PT services in a SNF setting upon discharge to safely address above deficits for decreased caregiver assistance and eventual return to PLOF.    Follow Up Recommendations  SNF     Equipment Recommendations  None recommended by PT    Recommendations for Other Services       Precautions / Restrictions Precautions Precautions: Fall;Posterior Hip Precaution Booklet Issued: Yes (comment) Precaution Comments: Pt able to recall 2/3 posterior THPs, required verbal cues for no hip IR Required Braces or Orthoses: Other Brace/Splint Other Brace/Splint: Pt wears speacial shoes with ankle bracing and a lift for leg length discrepancy correction Restrictions Weight Bearing Restrictions: Yes RLE Weight Bearing: Weight bearing as tolerated    Mobility  Bed Mobility Overal bed mobility: Needs Assistance Bed Mobility: Sit to Supine      Supine to sit: Min assist Sit to supine: Supervision   General bed mobility comments: Effortful with sit to sup with verbal cues to ensure post hip precautions maintained  Transfers Overall transfer level: Needs assistance Equipment used: Rolling walker (2 wheeled) Transfers: Sit to/from Stand Sit to Stand: Min guard         General transfer comment: Min verbal cues for sequencing during sit to/from stand transfers from elevated surfaces to ensure post hip precautions maintained.  Ambulation/Gait Ambulation/Gait assistance: Min guard Ambulation Distance (Feet): 80 Feet Assistive device: Rolling walker (2 wheeled) Gait Pattern/deviations: Step-through pattern;Decreased step length - left   Gait velocity interpretation: Below normal speed for age/gender General Gait Details: Pt continues to require mod verbal cues for sequencing during amb for amb closer to RW and during practice making 90 deg R turns to ensure no CKC R hip IR; pt able to verbalize sequencing but limited carryover during practical application.   Stairs            Wheelchair Mobility    Modified Rankin (Stroke Patients Only)       Balance Overall balance assessment: Needs assistance Sitting-balance support: Feet unsupported;Feet supported;No upper extremity supported;Bilateral upper extremity supported Sitting balance-Leahy Scale: Normal Sitting balance - Comments: no LOB while sitting EOB for LB dressing tasks   Standing balance support: Bilateral upper extremity supported Standing balance-Leahy Scale: Good  Cognition Arousal/Alertness: Awake/alert Behavior During Therapy: WFL for tasks assessed/performed Overall Cognitive Status: Within Functional Limits for tasks assessed                                        Exercises Total Joint Exercises Ankle Circles/Pumps: AROM;Both;10 reps;15 reps Quad Sets: Strengthening;Both;10 reps Gluteal  Sets: Strengthening;Both;10 reps Towel Squeeze: Strengthening;Both;10 reps Hip ABduction/ADduction: AAROM;Right;10 reps Straight Leg Raises: AAROM;Right;10 reps Long Arc Quad: AROM;10 reps;15 reps;Right Knee Flexion: AROM;10 reps;15 reps;Right Marching in Standing: AROM;Both;5 reps;10 reps Other Exercises Other Exercises: Posterior hip precation education and review verbally, with demonstration, and during functional mobility Other Exercises: HEP education and review per handout    General Comments        Pertinent Vitals/Pain Pain Assessment: 0-10 Pain Score: 5  Pain Location: R hip Pain Descriptors / Indicators: Operative site guarding;Sore Pain Intervention(s): Premedicated before session;Monitored during session;Limited activity within patient's tolerance    Home Living Family/patient expects to be discharged to:: Private residence Living Arrangements: Spouse/significant other Available Help at Discharge: Family;Available 24 hours/day Type of Home: Apartment ([place] apartment at [facility] Home Access: Level entry   Home Layout: One level Home Equipment: Environmental consultant - 2 wheels;Cane - single point;Shower seat - built in;Hand held shower head;Grab bars - tub/shower;Adaptive equipment      Prior Function Level of Independence: Independent with assistive device(s);Needs assistance  Gait / Transfers Assistance Needed: Pt Mod I with amb with SPC limited community distances secondary to R hip pain ADL's / Homemaking Assistance Needed: Spouse assists with donning socks/shoes and ankle braces, pt takes seated shower Comments: no fall history   PT Goals (current goals can now be found in the care plan section) Acute Rehab PT Goals Patient Stated Goal: get stronger  Progress towards PT goals: Progressing toward goals    Frequency    BID      PT Plan Current plan remains appropriate    Co-evaluation              AM-PAC PT "6 Clicks" Daily Activity  Outcome  Measure                   End of Session Equipment Utilized During Treatment: Gait belt Activity Tolerance: Patient tolerated treatment well Patient left: in bed;with bed alarm set;with SCD's reapplied;Other (comment);with call Andrew/phone within reach (Soft pillow hip abd in place) Nurse Communication: Mobility status PT Visit Diagnosis: Other abnormalities of gait and mobility (R26.89);Muscle weakness (generalized) (M62.81)     Time: 1410-1435 PT Time Calculation (min) (ACUTE ONLY): 25 min  Charges:  $Gait Training: 8-22 mins $Therapeutic Exercise: 8-22 mins $Therapeutic Activity: 8-22 mins                    G Codes:       DRoyetta Asal PT, DPT 04/15/17, 2:56 PM

## 2017-04-15 NOTE — Discharge Instructions (Signed)
Instructions after Total Hip Replacement     James P. Holley Bouche., M.D.     Dept. of [facility]  [place], [place]  [address]  Phone: [phone]   Fax: [phone]    DIET:  Drink plenty of non-alcoholic fluids.  Resume your normal diet. Include foods high in fiber.  ACTIVITY:   You may use crutches or a walker with weight-bearing as tolerated, unless instructed otherwise.  You may be weaned off of the walker or crutches by your Physical Therapist.   Do NOT reach below the level of your knees or cross your legs until allowed.     Continue doing gentle exercises. Exercising will reduce the pain and swelling, increase motion, and prevent muscle weakness.    Please continue to use the TED compression stockings for 6 weeks. You may remove the stockings at night, but should reapply them in the morning.  Do not drive or operate any equipment until instructed.  WOUND CARE:   Continue to use ice packs periodically to reduce pain and swelling.  Keep the incision clean and dry.  You may bathe or shower after the staples are removed at the first office visit following surgery.  MEDICATIONS:  You may resume your regular medications.  Please take the pain medication as prescribed on the medication.  Do not take pain medication on an empty stomach.  You have been given a prescription for a blood thinner to prevent blood clots. Please take the medication as instructed. (NOTE: After completing a 2 week course of Lovenox, take one Enteric-coated aspirin once a day.)  Pain medications and iron supplements can cause constipation. Use a stool softener (Senokot or Colace) on a daily basis and a laxative (dulcolax or miralax) as needed.  Do not drive or drink alcoholic beverages when taking pain medications.  CALL THE OFFICE FOR:  Temperature above 101 degrees  Excessive bleeding or drainage on the dressing.  Excessive  swelling, coldness, or paleness of the toes.  Persistent nausea and vomiting.  FOLLOW-UP:   You should have an appointment to return to the office in 6 weeks after surgery.  Arrangements have been made for continuation of Physical Therapy (either home therapy or outpatient therapy).    POSTERIOR TOTAL HIP REPLACEMENT POSTOPERATIVE DIRECTIONS  Hip Rehabilitation, Guidelines Following Surgery  The results of a hip operation are greatly improved after range of motion and muscle strengthening exercises. Follow all safety measures which are given to protect your hip. If any of these exercises cause increased pain or swelling in your joint, decrease the amount until you are comfortable again. Then slowly increase the exercises. Call your caregiver if you have problems or questions.   HOME CARE INSTRUCTIONS  Remove items at home which could result in a fall. This includes throw rugs or furniture in walking pathways.   ICE to the affected hip every three hours for 30 minutes at a time and then as needed for pain and swelling.  Continue to use ice on the hip for pain and swelling from surgery. You may notice swelling that will progress down to the foot and ankle.  This is normal after surgery.  Elevate the leg when you are not up walking on it.    Continue to use the breathing machine which will help keep your temperature down.  It is common for your temperature to cycle up and down following surgery, especially at night when you are not up  moving around and exerting yourself.  The breathing machine keeps your lungs expanded and your temperature down.  DIET You may resume your previous home diet once your are discharged from the hospital.  DRESSING / WOUND  CARE / SHOWERING  DO NOT GET THE INCISION WET You may start showering once staples have been removed at 2 weeks. Change dressing as needed. Do not submerge the incision in water such as a bath tub, swimming pool or hot tubs until the incision  is completely healed which is approximately 4 weeks.  STAPLE REMOVAL: Please remove staple at 2 weeks post op and apply benzoin and 1/2 inch steri strips  ACTIVITY Walk with your walker as instructed. Use walker as long as suggested by your caregivers.May go to using a cane once your therapist feels that it is safe to do so. Avoid periods of inactivity such as sitting longer than an hour when not asleep. This helps prevent blood clots.  You may resume a sexual relationship in one month or when given the OK by your doctor.  You may return to work once you are cleared by your doctor.  Do not drive a car for 6 weeks or until released by you surgeon.  Do not drive while taking narcotics.   WEIGHT BEARING You may wait bear as tolerated on the surgical leg.  POSTOPERATIVE CONSTIPATION PROTOCOL Constipation - defined medically as fewer than three stools per week and severe constipation as less than one stool per week.  One of the most common issues patients have following surgery is constipation.  Even if you have a regular bowel pattern at home, your normal regimen is likely to be disrupted due to multiple reasons following surgery.  Combination of anesthesia, postoperative narcotics, change in appetite and fluid intake all can affect your bowels.  In order to avoid complications following surgery, here are some recommendations in order to help you during your recovery period.  Colace (docusate) - Pick up an over-the-counter form of Colace or another stool softener and take twice a day as long as you are requiring postoperative pain medications.  Take with a full glass of water daily.  If you experience loose stools or diarrhea, hold the colace until you stool forms back up.  If your symptoms do not get better within 1 week or if they get worse, check with your doctor.  Dulcolax (bisacodyl) - Pick up over-the-counter and take as directed by the product packaging as needed to assist with the  movement of your bowels.  Take with a full glass of water.  Use this product as needed if not relieved by Colace only.   MiraLax (polyethylene glycol) - Pick up over-the-counter to have on hand.  MiraLax is a solution that will increase the amount of water in your bowels to assist with bowel movements.  Take as directed and can mix with a glass of water, juice, soda, coffee, or tea.  Take if you go more than two days without a movement. Do not use MiraLax more than once per day. Call your doctor if you are still constipated or irregular after using this medication for 7 days in a row.  If you continue to have problems with postoperative constipation, please contact the office for further assistance and recommendations.  If you experience "the worst abdominal pain ever" or develop nausea or vomiting, please contact the office immediatly for further recommendations for treatment.  ITCHING  If you experience itching with your medications, try taking only  a single pain pill, or even half a pain pill at a time.  You can also use Benadryl over the counter for itching or also to help with sleep.   TED HOSE STOCKINGS Wear the elastic stockings on both legs. If you go home, you may remove these at night but will need to put them on the first thing in the morning. If you go to rehab, then you may remove them one hour per 8 hour shift. This is because in rehab you are not as active as you are at home.  MEDICATIONS See your medication summary on the After Visit Summary that the nursing staff will review with you prior to discharge.  You may have some home medications which will be placed on hold until you complete the course of blood thinner medication.  It is important for you to complete the blood thinner medication as prescribed by your surgeon.  Continue your approved medications as instructed at time of discharge.  PRECAUTIONS If you experience chest pain or shortness of breath - call 911 immediately for  transfer to the hospital emergency department.  If you develop a fever greater that 101 F, purulent drainage from wound, increased redness or drainage from wound, foul odor from the wound/dressing, or calf pain - CONTACT YOUR SURGEON.                                                   FOLLOW-UP APPOINTMENTS Make sure you keep all of your appointments after your operation with your surgeon and caregivers. You should call the office at the above phone number and make an appointment for approximately 6 weeks after the date of your surgery or on the date instructed by your surgeon outlined in the "After Visit Summary". This appointment should have already been made prior to the surgery. If you don't remember the date and time, call the office at [phone].  RANGE OF MOTION AND STRENGTHENING EXERCISES  These exercises are designed to help you keep full movement of your hip joint. Follow your caregiver's or physical therapist's instructions. Perform all exercises about fifteen times, three times per day or as directed. Exercise both hips, even if you have had only one joint replacement. These exercises can be done on a training (exercise) mat, on the floor, on a table or on a bed. Use whatever works the best and is most comfortable for you. Use music or television while you are exercising so that the exercises are a pleasant break in your day. This will make your life better with the exercises acting as a break in routine you can look forward to.  Lying on your back, slowly slide your foot toward your buttocks, raising your knee up off the floor. Then slowly slide your foot back down until your leg is straight again.  Lying on your back spread your legs as far apart as you can without causing discomfort.  Lying on your side, raise your upper leg and foot straight up from the floor as far as is comfortable. Slowly lower the leg and repeat.  Lying on your back, tighten up the muscle in the front of your thigh  (quadriceps muscles). You can do this by keeping your leg straight and trying to raise your heel off the floor. This helps strengthen the largest muscle supporting your knee.  Lying  on your back, tighten up the muscles of your buttocks both with the legs straight and with the knee bent at a comfortable angle while keeping your heel on the floor.   DON'T FORGET THE 4 POSTERIOR HIP PRECAUTIONS   IF YOU ARE TRANSFERRED TO A SKILLED REHAB FACILITY If the patient is transferred to a skilled rehab facility following release from the hospital, a list of the current medications will be sent to the facility for the patient to continue.  When discharged from the skilled rehab facility, please have the facility set up the patient's [place] prior to being released. Also, the skilled facility will be responsible for providing the patient with their medications at time of release from the facility to include their pain medication, the muscle relaxants, and their blood thinner medication. If the patient is still at the rehab facility at time of the two week follow up appointment, the skilled rehab facility will also need to assist the patient in arranging follow up appointment in our office and any transportation needs.  MAKE SURE YOU:  Understand these instructions.  Get help right away if you are not doing well or get worse.    Pick up stool softner and laxative for home use following surgery while on pain medications. Continue to use ice for pain and swelling after surgery. Do not use any lotions or creams on the incision until instructed by your surgeon.

## 2017-04-15 NOTE — Progress Notes (Signed)
   Subjective: 1 Day Post-Op Procedure(s) (LRB): TOTAL HIP ARTHROPLASTY (Right) Patient reports pain as mild.   Patient is well, and has had no acute complaints or problems Did well with therapy yesterday.  Plan is to go Rehab after hospital stay. no nausea and no vomiting Patient denies any chest pains or shortness of breath. Objective: Vital signs in last 24 hours: Temp:  [97.4 F (36.3 C)-98.2 F (36.8 C)] 97.4 F (36.3 C) (10/23 0400) Pulse Rate:  [57-97] 57 (10/23 0400) Resp:  [12-26] 18 (10/23 0400) BP: (96-149)/(55-77) 147/67 (10/23 0400) SpO2:  [90 %-99 %] 99 % (10/23 0400) Weight:  [99.8 kg (220 lb)] 99.8 kg (220 lb) (10/22 1212) well approximated incision Heels are non tender and elevated off the bed using rolled towels Intake/Output from previous day: 10/22 0701 - 10/23 0700 In: 2551.7 [P.O.:750; I.V.:1801.7] Out: 8786 [Urine:3225; Drains:160; Blood:200] Intake/Output this shift: No intake/output data recorded.   Recent Labs  04/15/17 0435  HGB 12.7*    Recent Labs  04/15/17 0435  WBC 11.1*  RBC 4.12*  HCT 37.8*  PLT 229    Recent Labs  04/15/17 0435  NA 139  K 4.5  CL 104  CO2 25  BUN 19  CREATININE 1.03  GLUCOSE 135*  CALCIUM 8.6*   No results for input(s): LABPT, INR in the last 72 hours.  EXAM General - Patient is Alert, Appropriate and Oriented Extremity - Neurologically intact Neurovascular intact Sensation intact distally Intact pulses distally Dorsiflexion/Plantar flexion intact Compartment soft Dressing - dressing C/D/I Motor Function - intact, moving foot and toes well on exam.    Past Medical History:  Diagnosis Date  . Arthritis   . Depression   . Diabetes mellitus without complication ([place])   . Difficult intubation   . GERD (gastroesophageal reflux disease)   . Heart murmur   . Hypertension   . Macular degeneration    Right  . Neuropathy   . OSA on CPAP   . Prostate cancer ([place])     Assessment/Plan: 1 Day  Post-Op Procedure(s) (LRB): TOTAL HIP ARTHROPLASTY (Right) Active Problems:   Status post total replacement of hip  Estimated body mass index is 29.84 kg/m as calculated from the following:   Height as of this encounter: 6' (1.829 m).   Weight as of this encounter: 99.8 kg (220 lb). Advance diet Up with therapy D/C IV fluids Discharge to SNF  Labs: Were reviewed and acceptable DVT Prophylaxis - Lovenox, Foot Pumps and TED hose Weight-Bearing as tolerated to right leg D/C O2 and Pulse OX and try on Room Air Continue with physical therapy Begin working on bowel movement  Jon R. [place] 04/15/2017, 7:23 AM

## 2017-04-15 NOTE — Discharge Summary (Signed)
Physician Discharge Summary  Patient ID: Andrew Obrien MRN: [id number] DOB/AGE: [birth year] 82 y.o.  Admit date: 04/14/2017 Discharge date: 04/17/2017  Admission Diagnoses:  PRIMARY OSTEOARTHRITIS OF RIGHT HIP   Discharge Diagnoses: Patient Active Problem List   Diagnosis Date Noted  . Status post total replacement of hip 04/14/2017    Past Medical History:  Diagnosis Date  . Arthritis   . Depression   . Diabetes mellitus without complication ([place])   . Difficult intubation   . GERD (gastroesophageal reflux disease)   . Heart murmur   . Hypertension   . Macular degeneration    Right  . Neuropathy   . OSA on CPAP   . Prostate cancer [facility])      Transfusion: None   Consultants (if any):   Discharged Condition: Improved  Hospital Course: Andrew Obrien is an 82 y.o. male who was admitted 04/14/2017 with a diagnosis of degenerative arthrosis right hip and went to the operating room on 04/14/2017 and underwent the above named procedures.    Surgeries:Procedure(s): TOTAL HIP ARTHROPLASTY on 04/14/2017  PRE-OPERATIVE DIAGNOSIS: Degenerative arthrosis of the right hip, primary  POST-OPERATIVE DIAGNOSIS:  Same  PROCEDURE:  Right total hip arthroplasty  SURGEON:  Marciano Sequin. M.D.  ASSISTANT:  Vance Peper, PA (present and scrubbed throughout the case, critical for assistance with exposure, retraction, instrumentation, and closure)  ANESTHESIA: general  ESTIMATED BLOOD LOSS: 100 mL  FLUIDS REPLACED: 1250 mL of crystalloid  DRAINS: 2 medium drains to a Hemovac reservoir  IMPLANTS UTILIZED: DePuy 13.5 mm large stature AML femoral stem, 56 mm OD Pinnacle 100 acetabular component, neutral Pinnacle Marathon polyethylene insert, and a 36 mm M-SPEC +1.5 mm hip ball  INDICATIONS FOR SURGERY: Andrew Obrien is a 82 y.o. year old male with a long history of progressive hip and groin  pain. X-rays demonstrated severe degenerative changes. The patient had  not seen any significant improvement despite conservative nonsurgical intervention. After discussion of the risks and benefits of surgical intervention, the patient expressed understanding of the risks benefits and agree with plans for total hip arthroplasty.   The risks, benefits, and alternatives were discussed at length including but not limited to the risks of infection, bleeding, nerve injury, stiffness, blood clots, the need for revision surgery, limb length inequality, dislocation, cardiopulmonary complications, among others, and they were willing to proceed.  Patient tolerated the surgery well. No complications .Patient was taken to PACU where she was stabilized and then transferred to the orthopedic floor.  Patient started on Lovenox 30 q 12 hrs. Foot pumps applied bilaterally at 80 mm hgb. Heels elevated off bed with rolled towels. No evidence of DVT. Calves non tender. Negative Homan. Physical therapy started on day #1 for gait training and transfer with OT starting on  day #1 for ADL and assisted devices. Patient has done well with therapy. Ambulated 50 feet upon being discharged.  Patient's IV And Foley were discontinued on day #1 with Hemovac being discontinued on day #2. Dressing was changed on day 2 prior to patient being discharged   He was given perioperative antibiotics:  Anti-infectives    Start     Dose/Rate Route Frequency Ordered Stop   04/14/17 1230  ceFAZolin (ANCEF) 2 g in dextrose 5 % 100 mL IVPB     2 g 200 mL/hr over 30 Minutes Intravenous Every 6 hours 04/14/17 1221 04/15/17 0732   04/14/17 1215  ceFAZolin (ANCEF) IVPB 2g/100 mL premix  Status:  Discontinued  2 g 200 mL/hr over 30 Minutes Intravenous Every 6 hours 04/14/17 1212 04/14/17 1221   04/14/17 0614  ceFAZolin (ANCEF) 2-4 GM/100ML-% IVPB    Comments:  Dewayne Hatch   : cabinet override      04/14/17 0614 04/14/17 0743   04/14/17 0600  ceFAZolin (ANCEF) 2,000 mg in dextrose 5 % 100 mL IVPB  Status:   Discontinued     2,000 mg 240 mL/hr over 30 Minutes Intravenous On call to O.R. 04/14/17 0018 04/14/17 0322   04/14/17 0600  ceFAZolin (ANCEF) IVPB 2g/100 mL premix     2 g 200 mL/hr over 30 Minutes Intravenous On call to O.R. 04/14/17 8119 04/14/17 0753   04/14/17 0016  ceFAZolin (ANCEF) IVPB 2g/100 mL premix  Status:  Discontinued     2 g 200 mL/hr over 30 Minutes Intravenous On call to O.R. 04/14/17 0016 04/14/17 0016    .  He was fitted with AV 1 compression foot pump devices, instructed on heel pumps, early ambulation, and fitted with TED stockings bilaterally for DVT prophylaxis.  He benefited maximally from the hospital stay and there were no complications.    Recent vital signs:  Vitals:   04/15/17 0400 04/15/17 0729  BP: (!) 147/67 (!) 154/51  Pulse: (!) 57 (!) 59  Resp: 18 14  Temp: (!) 97.4 F (36.3 C) 97.7 F (36.5 C)  SpO2: 99% 100%    Recent laboratory studies:  Lab Results  Component Value Date   HGB 12.7 (L) 04/15/2017   HGB 12.9 (L) 04/02/2017   Lab Results  Component Value Date   WBC 11.1 (H) 04/15/2017   PLT 229 04/15/2017   Lab Results  Component Value Date   INR 0.96 04/02/2017   Lab Results  Component Value Date   NA 139 04/15/2017   K 4.5 04/15/2017   CL 104 04/15/2017   CO2 25 04/15/2017   BUN 19 04/15/2017   CREATININE 1.03 04/15/2017   GLUCOSE 135 (H) 04/15/2017    Discharge Medications:   Allergies as of 04/15/2017   No Known Allergies     Medication List    STOP taking these medications   aspirin EC 81 MG tablet     TAKE these medications   allopurinol 300 MG tablet Commonly known as:  ZYLOPRIM Take 300 mg by mouth daily.   azelastine 0.1 % nasal spray Commonly known as:  ASTELIN Place 1 spray into both nostrils 2 (two) times daily. Use in each nostril as directed   calcipotriene 0.005 % cream Commonly known as:  DOVONOX Apply 1 application topically 2 (two) times daily.   CALCIUM 600+D 600-800 MG-UNIT  Tabs Generic drug:  Calcium Carb-Cholecalciferol Take 1 capsule by mouth 2 (two) times daily.   calcium-vitamin D 500-200 MG-UNIT tablet Commonly known as:  OSCAL WITH D Take 1 tablet by mouth 2 (two) times daily.   cholecalciferol 1000 units tablet Commonly known as:  VITAMIN D Take 1,000 Units by mouth 2 (two) times daily.   DULoxetine 30 MG capsule Commonly known as:  CYMBALTA Take 30 mg by mouth 2 (two) times daily.   DYMISTA 137-50 MCG/ACT Susp Generic drug:  Azelastine-Fluticasone Place 1 spray into the nose at bedtime.   enoxaparin 40 MG/0.4ML injection Commonly known as:  LOVENOX Inject 0.4 mLs (40 mg total) into the skin daily.   fluocinolone 0.01 % external solution Commonly known as:  SYNALAR Apply 1 drop topically 2 (two) times daily as needed (uses in ears).  fluticasone 50 MCG/ACT nasal spray Commonly known as:  FLONASE Place 2 sprays into both nostrils at bedtime.   GLUCOSAMINE CHONDR 1500 COMPLX PO Take 1 tablet by mouth 2 (two) times daily.   Melatonin 5 MG Tabs Take 1 tablet by mouth at bedtime.   metFORMIN 1000 MG tablet Commonly known as:  GLUCOPHAGE Take 1,000 mg by mouth 2 (two) times daily with a meal.   multivitamin with minerals tablet Take 1 tablet by mouth daily.   MYRBETRIQ 50 MG Tb24 tablet Generic drug:  mirabegron ER Take 50 mg by mouth daily.   NON FORMULARY Take 1 tablet by mouth daily. [employer]   olmesartan-hydrochlorothiazide 40-12.5 MG tablet Commonly known as:  BENICAR HCT Take 1 tablet by mouth daily.   omeprazole 20 MG capsule Commonly known as:  PRILOSEC Take 20 mg by mouth every morning.   oxyCODONE 5 MG immediate release tablet Commonly known as:  Oxy IR/ROXICODONE Take 1 tablet (5 mg total) by mouth every 3 (three) hours as needed for moderate pain ((score 4 to 6)).   Potassium 99 MG Tabs Take 1 tablet by mouth daily.   pregabalin 100 MG capsule Commonly known as:  LYRICA Take 100 mg by mouth 2 (two)  times daily.   rOPINIRole 1 MG tablet Commonly known as:  REQUIP Take 1 mg by mouth at bedtime.   rosuvastatin 10 MG tablet Commonly known as:  CRESTOR Take 10 mg by mouth every evening.   tamsulosin 0.4 MG Caps capsule Commonly known as:  FLOMAX Take 0.4 mg by mouth daily.   temazepam 7.5 MG capsule Commonly known as:  RESTORIL Take 7.5 mg by mouth at bedtime.   traMADol 50 MG tablet Commonly known as:  ULTRAM Take 1-2 tablets (50-100 mg total) by mouth every 4 (four) hours as needed for moderate pain.   vitamin C 500 MG tablet Commonly known as:  ASCORBIC ACID Take 500 mg by mouth daily.            Durable Medical Equipment        Start     Ordered   04/14/17 1212  DME Walker rolling  Once    Question:  Patient needs a walker to treat with the following condition  Answer:  S/P total hip arthroplasty   04/14/17 1212   04/14/17 1212  DME Bedside commode  Once    Question:  Patient needs a bedside commode to treat with the following condition  Answer:  S/P total hip arthroplasty   04/14/17 1212      Diagnostic Studies: Dg Hip Port Unilat With Pelvis 1v Right  Result Date: 04/14/2017 CLINICAL DATA:  Post RIGHT total hip replacement EXAM: DG HIP (WITH OR WITHOUT PELVIS) 1V PORT RIGHT COMPARISON:  Portable exam 1022 hours compared to 12/04/2016 FINDINGS: Components of a RIGHT hip prosthesis are newly identified. Mild osseous demineralization. No acute fracture or dislocation. Skin clips and surgical drains noted. IMPRESSION: RIGHT hip prosthesis without acute complication. Electronically Signed   By: Lavonia Dana M.D.   On: 04/14/2017 10:49    Disposition: 01-Home or Self Care  Discharge Instructions    Diet - low sodium heart healthy    Complete by:  As directed    Increase activity slowly    Complete by:  As directed       Follow-up Information    Hooten, Laurice Record, MD On 05/29/2017.   Specialty:  Orthopedic Surgery Why:  at 10:00am Contact  information: [address]  [place] [address] [phone]            Signed: Watt Climes 04/15/2017, 7:36 AM

## 2017-04-15 NOTE — Evaluation (Signed)
Occupational Therapy Evaluation Patient Details Name: Andrew Obrien MRN: [id number] DOB: 1934/09/24 Today's Date: 04/15/2017    History of Present Illness Pt is an 82 yo M diagnosed with degenerative arthrosis of the righthip who is now s/p elective righttotal hip arthroplasty.  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     Clinical Impression   Pt seen for OT evaluation this date. Pt was generally independent in ADLs (required min assist from spouse for socks/shoes) prior to surgery and is eager to return to PLOF.  Pt is currently limited in functional ADLs due to pain, decreased ROM, and decreased knowledge of posterior THPs.  Pt requires min to mod  assist for LB dressing and bathing skills in order to maintain hip precautions. Pt educated in AE/DME for ADL tasks and pt able to return demo good technique with sock aid to don socks seated EOB. Pt would benefit from continued skilled OT services for education in assistive devices, functional mobility, and education in recommendations for home modifications to increase safety and prevent falls.  Pt is a good candidate for SNF to continue rehabilitation.      Follow Up Recommendations  SNF    Equipment Recommendations  3 in 1 bedside commode;Toilet riser    Recommendations for Other Services       Precautions / Restrictions Precautions Precautions: Fall;Posterior Hip Precaution Booklet Issued: No Precaution Comments: Pt able to recall 2/3 posterior THPs, required verbal cue for 3rd, later able to demonstrate understanding by maintaining THPs during functional tasks Required Braces or Orthoses: Other Brace/Splint Other Brace/Splint: Pt wears special shoes with ankle bracing and a lift for leg length discrepancy correction Restrictions Weight Bearing Restrictions: Yes RLE Weight Bearing: Weight bearing as tolerated      Mobility Bed  Mobility Overal bed mobility: Needs Assistance Bed Mobility: Supine to Sit;Sit to Supine     Supine to sit: Min assist Sit to supine: Min assist   General bed mobility comments: Effortful with sup to sit with min A for RLE out of bed and to ensure post hip precautions maintained, min assist for RLE mgt back to bed  Transfers Overall transfer level: Needs assistance Equipment used: Rolling walker (2 wheeled) Transfers: Sit to/from Stand Sit to Stand: Min guard              Balance Overall balance assessment: Needs assistance Sitting-balance support: Feet unsupported;Feet supported;No upper extremity supported;Bilateral upper extremity supported Sitting balance-Leahy Scale: Normal Sitting balance - Comments: no LOB while sitting EOB for LB dressing tasks   Standing balance support: Bilateral upper extremity supported Standing balance-Leahy Scale: Good                             ADL either performed or assessed with clinical judgement   ADL Overall ADL's : Needs assistance/impaired Eating/Feeding: Sitting;Set up   Grooming: Sitting;Set up   Upper Body Bathing: Sitting;Set up;Supervision/ safety   Lower Body Bathing: Sitting/lateral leans;Minimal assistance;Moderate assistance;Adhering to hip precautions   Upper Body Dressing : Sitting;Set up   Lower Body Dressing: Sitting/lateral leans;With adaptive equipment;Set up;Supervision/safety;Sit to/from stand;Min guard Lower Body Dressing Details (indicate cue type and reason): min guard when standing to complete LB dressing task. Pt educated in use of AE for LB dressing with pt able to return demo good technique while maintaining posterior THPs Toilet Transfer: RW;Min guard;Ambulation;Comfort  height toilet Toilet Transfer Details (indicate cue type and reason): pt educated in use of 3:1 and/or toilet riser to improve height of toilet at home for improved comfort and safety with toilet transfers. Pt verbalized  understanding.         Functional mobility during ADLs: Rolling walker;Min guard       Vision Baseline Vision/History: Macular Degeneration (R eye) Patient Visual Report: No change from baseline Vision Assessment?: No apparent visual deficits     Perception     Praxis      Pertinent Vitals/Pain Pain Assessment: 0-10 Pain Score: 5  Pain Location: R hip Pain Descriptors / Indicators: Operative site guarding;Sore Pain Intervention(s): Limited activity within patient's tolerance;Monitored during session;Premedicated before session;Repositioned;Ice applied     Hand Dominance Right   Extremity/Trunk Assessment Upper Extremity Assessment Upper Extremity Assessment: Overall WFL for tasks assessed   Lower Extremity Assessment Lower Extremity Assessment: Defer to PT evaluation;Generalized weakness;RLE deficits/detail RLE Deficits / Details: grossly 4-/5   Cervical / Trunk Assessment Cervical / Trunk Assessment: Normal   Communication Communication Communication: HOH (wears bilateral hearing aides)   Cognition Arousal/Alertness: Awake/alert Behavior During Therapy: WFL for tasks assessed/performed Overall Cognitive Status: Within Functional Limits for tasks assessed                                     General Comments       Exercises Other Exercises Other Exercises: Pt educated in compression stocking mgt, and review of posterior THPs. Pt verbalized understanding.   Shoulder Instructions      Home Living Family/patient expects to be discharged to:: Private residence Living Arrangements: Spouse/significant other Available Help at Discharge: Family;Available 24 hours/day Type of Home: Apartment ([place] apartment at [facility] Home Access: Level entry     Home Layout: One level     Bathroom Shower/Tub: Occupational psychologist: Standard     Home Equipment: Environmental consultant - 2 wheels;Cane - single point;Shower seat - built in;Hand held shower  head;Grab bars - tub/shower;Adaptive equipment Adaptive Equipment: Reacher        Prior Functioning/Environment Level of Independence: Independent with assistive device(s);Needs assistance  Gait / Transfers Assistance Needed: Pt Mod I with amb with SPC limited community distances secondary to R hip pain ADL's / Homemaking Assistance Needed: Spouse assists with donning socks/shoes and ankle braces, pt takes seated shower   Comments: no fall history        OT Problem List: Decreased strength;Pain;Decreased range of motion;Decreased activity tolerance;Decreased knowledge of use of DME or AE;Decreased knowledge of precautions      OT Treatment/Interventions: Self-care/ADL training;Therapeutic exercise;DME and/or AE instruction;Patient/family education;Therapeutic activities    OT Goals(Current goals can be found in the care plan section) Acute Rehab OT Goals Patient Stated Goal: get stronger  OT Goal Formulation: With patient Time For Goal Achievement: 04/29/17 Potential to Achieve Goals: Good  OT Frequency: Min 1X/week   Barriers to D/C:            Co-evaluation              AM-PAC PT "6 Clicks" Daily Activity     Outcome Measure Help from another person eating meals?: A Little Help from another person taking care of personal grooming?: A Little Help from another person toileting, which includes using toliet, bedpan, or urinal?: A Little Help from another person bathing (including washing, rinsing, drying)?: A Lot Help  from another person to put on and taking off regular upper body clothing?: A Little Help from another person to put on and taking off regular lower body clothing?: A Little 6 Click Score: 17   End of Session    Activity Tolerance: Patient tolerated treatment well Patient left: in bed;with call bell/phone within reach;with bed alarm set;with SCD's reapplied;Other (comment) (abductor pillow in place)  OT Visit Diagnosis: Other abnormalities of gait and  mobility (R26.89);Pain Pain - Right/Left: Right Pain - part of body: Hip                Time: 4388-8757 OT Time Calculation (min): 24 min Charges:  OT General Charges $OT Visit: 1 Visit OT Evaluation $OT Eval Low Complexity: 1 Low OT Treatments $Self Care/Home Management : 8-22 mins G-Codes: OT G-codes **NOT FOR INPATIENT CLASS** Functional Assessment Tool Used: AM-PAC 6 Clicks Daily Activity;Clinical judgement Functional Limitation: Self care Self Care Current Status (V7282): At least 40 percent but less than 60 percent impaired, limited or restricted Self Care Goal Status (S6015): At least 20 percent but less than 40 percent impaired, limited or restricted   Jeni Salles, MPH, MS, OTR/L ascom [phone] 04/15/17, 11:24 AM

## 2017-04-15 NOTE — Progress Notes (Signed)
Physical Therapy Treatment Patient Details Name: Andrew Obrien MRN: [id number] DOB: March 02, 1935 Today's Date: 04/15/2017    History of Present Illness Pt is an 82 yo M diagnosed with degenerative arthrosis of the righthip who is now s/p elective righttotal hip arthroplasty.  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      PT Comments    Pt progressing towards goals but continues to present with deficits in strength, transfers, mobility, gait, balance, and activity tolerance.  Pt required min A and verbal cues during sit to sup for RLE into bed and to maintain hip precautions.  Pt CGA with sit to/from stand transfers with verbal cues required for correct sequencing to prevent excess R hip flex.  Pt able to amb 1 x 40' and 1 x 30' with beginning reciprocal gait pattern with slow cadence but LLE step length remains limited.  Pt tolerated session with only slight increase to surgical site pain.  Pt will benefit from PT services in a SNF setting upon discharge to safely address above deficits for decreased caregiver assistance and eventual return to PLOF.     Follow Up Recommendations  SNF     Equipment Recommendations  None recommended by PT    Recommendations for Other Services       Precautions / Restrictions Precautions Precautions: Fall;Posterior Hip Precaution Booklet Issued: Yes (comment) Precaution Comments: Pt able to recall 2/3 posterior THPs, required verbal cues for no hip IR Required Braces or Orthoses: Other Brace/Splint Other Brace/Splint: Pt wears speacial shoes with ankle bracing and a lift for leg length discrepancy correction Restrictions Weight Bearing Restrictions: Yes RLE Weight Bearing: Weight bearing as tolerated    Mobility  Bed Mobility Overal bed mobility: Needs Assistance Bed Mobility: Sit to Supine     Supine to sit: Min assist Sit to supine: Min assist   General  bed mobility comments: Effortful with sit to sup with min A for RLE into bed and to ensure post hip precautions maintained  Transfers Overall transfer level: Needs assistance Equipment used: Rolling walker (2 wheeled) Transfers: Sit to/from Stand Sit to Stand: Min guard         General transfer comment: Min verbal cues for sequencing during sit to/from stand transfers from elevated surfaces to ensure post hip precautions maintained.  Ambulation/Gait Ambulation/Gait assistance: Min guard Ambulation Distance (Feet): 50 Feet Assistive device: Rolling walker (2 wheeled) Gait Pattern/deviations: Step-through pattern;Decreased step length - left   Gait velocity interpretation: Below normal speed for age/gender General Gait Details: Mod verbal cues for sequencing during amb with practice making 90 deg R turns to ensure no CKC R hip IR; beginning reciprocal gait with slow cadence with decreased LLE step length    Stairs            Wheelchair Mobility    Modified Rankin (Stroke Patients Only)       Balance Overall balance assessment: Needs assistance Sitting-balance support: Feet unsupported;Feet supported;No upper extremity supported;Bilateral upper extremity supported Sitting balance-Leahy Scale: Normal Sitting balance - Comments: no LOB while sitting EOB for LB dressing tasks   Standing balance support: Bilateral upper extremity supported Standing balance-Leahy Scale: Good                              Cognition Arousal/Alertness: Awake/alert Behavior During Therapy: WFL for tasks assessed/performed Overall Cognitive Status: Within  Functional Limits for tasks assessed                                        Exercises Total Joint Exercises Ankle Circles/Pumps: AROM;Strengthening;Both;10 reps Quad Sets: Strengthening;Both;10 reps Gluteal Sets: Strengthening;Both;10 reps Towel Squeeze: Strengthening;Both;10 reps Hip ABduction/ADduction:  AAROM;Right;5 reps Straight Leg Raises: AAROM;Right;5 reps Long Arc Quad: AROM;Both;10 reps;5 reps Knee Flexion: AROM;Both;5 reps;10 reps Marching in Standing: AROM;Both;5 reps;10 reps Other Exercises Other Exercises: Posterior hip precation education and review verbally, with demonstration, and during functional mobility Other Exercises: HEP education and review per handout    General Comments        Pertinent Vitals/Pain Pain Assessment: 0-10 Pain Score: 4  Pain Location: R hip Pain Descriptors / Indicators: Operative site guarding;Sore Pain Intervention(s): Premedicated before session;Monitored during session;Limited activity within patient's tolerance    Home Living Family/patient expects to be discharged to:: Private residence Living Arrangements: Spouse/significant other Available Help at Discharge: Family;Available 24 hours/day Type of Home: Apartment ([place] apartment at [facility] Home Access: Level entry   Home Layout: One level Home Equipment: Environmental consultant - 2 wheels;Cane - single point;Shower seat - built in;Hand held shower head;Grab bars - tub/shower;Adaptive equipment      Prior Function Level of Independence: Independent with assistive device(s);Needs assistance  Gait / Transfers Assistance Needed: Pt Mod I with amb with SPC limited community distances secondary to R hip pain ADL's / Homemaking Assistance Needed: Spouse assists with donning socks/shoes and ankle braces, pt takes seated shower Comments: no fall history   PT Goals (current goals can now be found in the care plan section) Acute Rehab PT Goals Patient Stated Goal: get stronger  Progress towards PT goals: Progressing toward goals    Frequency    BID      PT Plan Current plan remains appropriate    Co-evaluation              AM-PAC PT "6 Clicks" Daily Activity  Outcome Measure                   End of Session Equipment Utilized During Treatment: Gait belt Activity  Tolerance: Patient tolerated treatment well Patient left: in bed;with bed alarm set;with SCD's reapplied;Other (comment);with call bell/phone within reach (Soft Abd pillow in place) Nurse Communication: Mobility status PT Visit Diagnosis: Other abnormalities of gait and mobility (R26.89);Muscle weakness (generalized) (M62.81)     Time: 7017-7939 PT Time Calculation (min) (ACUTE ONLY): 40 min  Charges:  $Gait Training: 8-22 mins $Therapeutic Exercise: 8-22 mins $Therapeutic Activity: 8-22 mins                    G Codes:       DRoyetta Asal PT, DPT 04/15/17, 12:06 PM

## 2017-04-15 NOTE — Clinical Social Work Note (Addendum)
Clinical Social Work Assessment  Patient Details  Name: Andrew Obrien MRN: [id number] Date of Birth: 1935/05/05  Date of referral:  04/15/17               Reason for consult:  Discharge Planning, Facility Placement                Permission sought to share information with:  Chartered certified accountant granted to share information::  Yes, Verbal Permission Granted  Name::      [place]::   [place]  Relationship::     Contact Information:     Housing/Transportation Living arrangements for the past 2 months:  [place] of Information:  Patient Patient Interpreter Needed:  None Criminal Activity/Legal Involvement Pertinent to Current Situation/Hospitalization:  No - Comment as needed Significant Relationships:  Spouse Lives with:  Spouse Do you feel safe going back to the place where you live?  Yes Need for family participation in patient care:  Yes (Comment)  Care giving concerns:  Patient has lived in [facility] ([place] living connected to [facility]) with his wife Andrew Obrien for 6 months.   Social Worker assessment / plan: Holiday representative (CSW) received SNF consult. PT is recommending SNF. Social work Theatre manager met with patient alone at bedside. Patient was sitting up in chair and was alert and oriented x4. Social work Theatre manager introduced self and explained the role of the Marie. Patient stated that he lives in the [facility] with his wife Andrew Obrien [phone]). Patient stated that he does have a HPOA but could not remember their information. Social work Theatre manager explained the SNF process. Social work Theatre manager also explained that his [employer], requires a 3 night inpatient qualifying stay in a hospital in order to pay for SNF. Patient was admitted on 04/14/17. Patient verbalized his understanding and stated that he has been in contact with [facility] and will complete rehab there since he is a resident of their independent community. FL2 completed and faxed out.   Per Andrew Obrien admissions coordinator at [facility] patient can come to [facility] for short term rehab and completed his admissions paper work prior to surgery. Plan is for patient to D/C to [facility] Thursday 04/17/17 pending medical clearance. CSW will continue to follow and assist as needed.   Employment status:  Retired Forensic scientist:  Medicare PT Recommendations:  Andrew Obrien / Referral to community resources:  [place]  Patient/Family's Response to care:  Patient chose to complete his rehab at [facility] since he is a resident in the independent community.  Patient/Family's Understanding of and Emotional Response to Diagnosis, Current Treatment, and Prognosis: Patient was pleasant and thanked Social work Theatre manager for her assistance.   Emotional Assessment Appearance:  Appears stated age Attitude/Demeanor/Rapport:    Affect (typically observed):  Accepting, Calm, Pleasant Orientation:  Oriented to Self, Oriented to Place, Oriented to  Time, Oriented to Situation Alcohol / Substance use:  Not Applicable Psych involvement (Current and /or in the community):  No (Comment)  Discharge Needs  Concerns to be addressed:  Discharge Planning Concerns, Care Coordination Readmission within the last 30 days:  No Current discharge risk:  Dependent with Mobility Barriers to Discharge:  Continued Medical Work up   Andrew Obrien, Student-Social Work 04/15/2017, 8:48 AM

## 2017-04-16 LAB — BASIC METABOLIC PANEL
ANION GAP: 9 (ref 5–15)
BUN: 21 mg/dL — AB (ref 6–20)
CHLORIDE: 104 mmol/L (ref 101–111)
CO2: 28 mmol/L (ref 22–32)
Calcium: 8.9 mg/dL (ref 8.9–10.3)
Creatinine, Ser: 1.15 mg/dL (ref 0.61–1.24)
GFR calc Af Amer: >60 mL/min (ref >60)
GFR, EST NON AFRICAN AMERICAN: 58 mL/min — AB (ref >60)
Glucose, Bld: 136 mg/dL — ABNORMAL HIGH (ref 65–99)
POTASSIUM: 4.2 mmol/L (ref 3.5–5.1)
SODIUM: 141 mmol/L (ref 135–145)

## 2017-04-16 LAB — GLUCOSE, CAPILLARY
GLUCOSE-CAPILLARY: 135 mg/dL — AB (ref 65–99)
GLUCOSE-CAPILLARY: 82 mg/dL (ref 65–99)
GLUCOSE-CAPILLARY: 130 mg/dL — AB (ref 65–99)
GLUCOSE-CAPILLARY: 136 mg/dL — AB (ref 65–99)

## 2017-04-16 LAB — CBC
HCT: 37.8 % — ABNORMAL LOW (ref 40.0–52.0)
HEMOGLOBIN: 12.6 g/dL — AB (ref 13.0–18.0)
MCH: 30.9 pg (ref 26.0–34.0)
MCHC: 33.4 g/dL (ref 32.0–36.0)
MCV: 92.4 fL (ref 80.0–100.0)
Platelets: 234 10*3/uL (ref 150–440)
RBC: 4.09 MIL/uL — AB (ref 4.40–5.90)
RDW: 13.7 % (ref 11.5–14.5)
WBC: 10 10*3/uL (ref 3.8–10.6)

## 2017-04-16 LAB — SURGICAL PATHOLOGY

## 2017-04-16 MED ORDER — LACTULOSE 10 GM/15ML PO SOLN
10.0000 g | Freq: Two times a day (BID) | ORAL | Status: DC | PRN
  Administered 2017-04-17: 10 g via ORAL
  Filled 2017-04-16: qty 30

## 2017-04-16 NOTE — Progress Notes (Signed)
   Subjective: 2 Days Post-Op Procedure(s) (LRB): TOTAL HIP ARTHROPLASTY (Right) Patient reports pain as 7 on 0-10 scale.   Patient is well, and has had no acute complaints or problems Patient did very well with physical therapy yesterday. Complaining of a lot of back pain. Patient spent most the night in a chair. Very restless. Plan is to go Rehab after hospital stay. no nausea and no vomiting Patient denies any chest pains or shortness of breath. Objective: Vital signs in last 24 hours: Temp:  [97.7 F (36.5 C)-98.6 F (37 C)] 98.6 F (37 C) (10/23 1936) Pulse Rate:  [59-79] 79 (10/23 1936) Resp:  [14-17] 17 (10/23 1936) BP: (93-154)/(44-51) 98/44 (10/23 1936) SpO2:  [96 %-100 %] 99 % (10/23 1936) well approximated incision Heels are non tender and elevated off the bed using rolled towels Intake/Output from previous day: 10/23 0701 - 10/24 0700 In: 840 [P.O.:840] Out: 2280 [Urine:2150; Drains:130] Intake/Output this shift: Total I/O In: -  Out: 2150 [Urine:2150]   Recent Labs  04/15/17 0435  HGB 12.7*    Recent Labs  04/15/17 0435  WBC 11.1*  RBC 4.12*  HCT 37.8*  PLT 229    Recent Labs  04/15/17 0435  NA 139  K 4.5  CL 104  CO2 25  BUN 19  CREATININE 1.03  GLUCOSE 135*  CALCIUM 8.6*   No results for input(s): LABPT, INR in the last 72 hours.  EXAM General - Patient is Alert, Appropriate and Oriented Extremity - Neurologically intact Neurovascular intact Sensation intact distally Intact pulses distally Dorsiflexion/Plantar flexion intact No cellulitis present Compartment soft Dressing - Dressing was dry have her the 4x4 around the drain site was saturated and draining serosanguineous fluid. This is secondary to the fact that the patient is very restless and-and almost pulled his Hemovac completely out and had removed part of the dressing from around the Hemovac. Motor Function - intact, moving foot and toes well on exam.    Past Medical  History:  Diagnosis Date  . Arthritis   . Depression   . Diabetes mellitus without complication ([place])   . Difficult intubation   . GERD (gastroesophageal reflux disease)   . Heart murmur   . Hypertension   . Macular degeneration    Right  . Neuropathy   . OSA on CPAP   . Prostate cancer ([place])     Assessment/Plan: 2 Days Post-Op Procedure(s) (LRB): TOTAL HIP ARTHROPLASTY (Right) Active Problems:   Status post total replacement of hip  Estimated body mass index is 29.84 kg/m as calculated from the following:   Height as of this encounter: 6' (1.829 m).   Weight as of this encounter: 99.8 kg (220 lb). Up with therapy Plan for discharge tomorrow Discharge to SNF  Labs: None DVT Prophylaxis - Lovenox, Foot Pumps and TED hose Weight-Bearing as tolerated to right leg Patient needs a bowel movement today. Lactulose ordered Please apply a new sacrum pad Hemovac discontinued on today's visit  Alston Berrie R. [facility] [place] 04/16/2017, 6:54 AM

## 2017-04-16 NOTE — Progress Notes (Signed)
Physical Therapy Treatment Patient Details Name: Andrew Obrien MRN: [id number] DOB: September 19, 1934 Today's Date: 04/16/2017    History of Present Illness Pt is an 82 yo M diagnosed with degenerative arthrosis of the righthip who is now s/p elective righttotal hip arthroplasty.  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      PT Comments    Pt presents with deficits in strength, transfers, mobility, gait, balance, and activity tolerance.  Pt reported very little sleep during the night with no reason for restlessness identified.  Pt did present with an increased need for verbal cues with sequencing during bed mobility, transfers, and gait this session along with increased assistance with bed mobility and decreased amb tolerance, nursing notifed.  No adverse symptoms reported other than fatigue with SpO2 and HR WNL.  Pt will benefit from PT services in a SNF setting upon discharge to safely address above deficits for decreased caregiver assistance and eventual return to PLOF.      Follow Up Recommendations  SNF     Equipment Recommendations  None recommended by PT    Recommendations for Other Services       Precautions / Restrictions Precautions Precautions: Fall;Posterior Hip Precaution Booklet Issued: Yes (comment) Required Braces or Orthoses: Other Brace/Splint Other Brace/Splint: Pt wears speacial shoes with ankle bracing and a lift for leg length discrepancy correction Restrictions Weight Bearing Restrictions: Yes RLE Weight Bearing: Weight bearing as tolerated    Mobility  Bed Mobility Overal bed mobility: Needs Assistance Bed Mobility: Sit to Supine     Supine to sit: Min assist     General bed mobility comments: Effortful with sit to sup with verbal cues to ensure post hip precautions maintained and min A for RLE into bed this session  Transfers Overall transfer level: Needs  assistance Equipment used: Rolling walker (2 wheeled) Transfers: Sit to/from Stand Sit to Stand: Min guard         General transfer comment: Mod verbal cues for sequencing during sit to/from stand transfers from elevated surfaces to ensure post hip precautions maintained.  Nursing and pt education provided to encure pt sits on pillow in recliner for increased chair height.  Ambulation/Gait Ambulation/Gait assistance: Min guard Ambulation Distance (Feet): 50 Feet Assistive device: Rolling walker (2 wheeled) Gait Pattern/deviations: Step-through pattern;Decreased step length - left;Antalgic   Gait velocity interpretation: Below normal speed for age/gender General Gait Details: Decreased amb tolerance this session that pt attributes to fatigue secondary to little sleep during the night.  Pt also reports slightly increased pain to RLE with WB.    Stairs            Wheelchair Mobility    Modified Rankin (Stroke Patients Only)       Balance Overall balance assessment: Needs assistance Sitting-balance support: Feet unsupported;Feet supported;No upper extremity supported;Bilateral upper extremity supported Sitting balance-Leahy Scale: Normal     Standing balance support: Bilateral upper extremity supported Standing balance-Leahy Scale: Good                              Cognition Arousal/Alertness: Awake/alert Behavior During Therapy: WFL for tasks assessed/performed Overall Cognitive Status: Within Functional Limits for tasks assessed  General Comments: Pt seemed minimally different from baseline this session with increased verbal cues required for sequencing, nursing notifed.  Pt did report very little sleep during the night.       Exercises Total Joint Exercises Ankle Circles/Pumps: AROM;Both;10 reps;15 reps Towel Squeeze: Strengthening;Both;10 reps Hip ABduction/ADduction: AAROM;Right;10 reps Straight Leg  Raises: AAROM;Right;10 reps Long Arc Quad: AROM;10 reps;15 reps;Right Knee Flexion: AROM;10 reps;15 reps;Right Marching in Standing: AROM;Both;5 reps;10 reps    General Comments        Pertinent Vitals/Pain Pain Assessment: 0-10 Pain Score: 5  Pain Location: R hip Pain Descriptors / Indicators: Operative site guarding;Aching;Sore Pain Intervention(s): RN gave pain meds during session;Monitored during session;Limited activity within patient's tolerance    Home Living                      Prior Function            PT Goals (current goals can now be found in the care plan section) Progress towards PT goals: Not progressing toward goals - comment;Progressing toward goals (Small set back with gait and functional mobility this session but overall progressing towards goals)    Frequency    BID      PT Plan Current plan remains appropriate    Co-evaluation              AM-PAC PT "6 Clicks" Daily Activity  Outcome Measure                   End of Session Equipment Utilized During Treatment: Gait belt Activity Tolerance: Patient limited by fatigue Patient left: in bed;with bed alarm set;with SCD's reapplied;Other (comment);with call bell/phone within reach (Soft abd pillow in place) Nurse Communication: Mobility status;Other (comment) (Pt fatigue and increased cues for sequencing during session) PT Visit Diagnosis: Other abnormalities of gait and mobility (R26.89);Muscle weakness (generalized) (M62.81)     Time: 7262-0355 PT Time Calculation (min) (ACUTE ONLY): 24 min  Charges:  $Gait Training: 8-22 mins $Therapeutic Exercise: 8-22 mins                    G Codes:       D. Scott Marrio Scribner PT, DPT 04/16/17, 11:00 AM

## 2017-04-16 NOTE — Progress Notes (Signed)
PT Cancellation Note  Patient Details Name: Breckyn Ticas MRN: [id number] DOB: 12-29-1934   Cancelled Treatment:    Reason Eval/Treat Not Completed: Patient declined, no reason specified; patient declined afternoon PT session secondary to significant fatigue, nursing notified.  Will attempt to see pt tomorrow as medically appropriate.    Linus Salmons PT, DPT 04/16/17, 2:36 PM

## 2017-04-16 NOTE — Progress Notes (Signed)
Occupational Therapy Treatment Patient Details Name: Andrew Obrien MRN: [id number] DOB: 1934-12-19 Today's Date: 04/16/2017    History of present illness Pt is an 82 yo M diagnosed with degenerative arthrosis of the righthip who is now s/p elective righttotal hip arthroplasty.  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     OT comments  Pt received up in recliner, bending over in an attempt to clean up a spill from his urinal on the floor while seated in recliner. Pt instructed to call nursing for assist for things like this in the future due to the need to maintain his posterior total hip precautions. Pt was able to recall 2/3 precautions (no IR, crossing legs) but needed additional cues to recall no bending past 90 degrees at the hip even after initial instruction. OT cleaned up the floor, notified nursing secretary to request that environmental services come to sanitize the floor. Pt educated in positioning and falls prevention. Pt verbalized understanding. Endorses not sleeping well and not feeling too well today, stating his head felt a little more "fuzzy" today. Pt oriented, but does not seem as sharp as he was previous date with evaluated. Pt encouraged to rest and notify nursing if he needed anything. Pt set up with clean urinal, hand wipes, and tray with additional items including phone and access to the call bell.    Follow Up Recommendations  SNF    Equipment Recommendations  3 in 1 bedside commode;Toilet riser    Recommendations for Other Services      Precautions / Restrictions Precautions Precautions: Fall;Posterior Hip Precaution Comments: Pt able to recall 2/3 posterior THPs, required verbal cues for no hip flexion past 90 degrees Required Braces or Orthoses: Other Brace/Splint Other Brace/Splint: Pt wears special shoes with ankle bracing and a lift for leg length discrepancy  correction Restrictions Weight Bearing Restrictions: Yes RLE Weight Bearing: Weight bearing as tolerated       Mobility Bed Mobility               General bed mobility comments: pt up in recliner  Transfers                      Balance                                           ADL either performed or assessed with clinical judgement   ADL Overall ADL's : Needs assistance/impaired                                             Vision Baseline Vision/History: Macular Degeneration (R eye) Patient Visual Report: No change from baseline Vision Assessment?: No apparent visual deficits   Perception     Praxis      Cognition Arousal/Alertness: Awake/alert Behavior During Therapy: WFL for tasks assessed/performed Overall Cognitive Status: Within Functional Limits for tasks assessed                                 General Comments: slightly more confused this date, still oriented, just reporting that his head felt a little  more "fuzzy" than normal. Nursing notified. Reported being fatigued, not sleeping well last night.        Exercises Other Exercises Other Exercises: Pt educated in positioning and posterior total hip precautions again to support recall, pt able to initially verbalize 2/3 with verbal cue for 3rd. Will continue to review to ensure recall and carryover.    Shoulder Instructions       General Comments      Pertinent Vitals/ Pain       Pain Assessment: 0-10 Pain Score: 6  Pain Location: R hip Pain Descriptors / Indicators: Operative site guarding;Aching;Sore Pain Intervention(s): Limited activity within patient's tolerance;Monitored during session;Repositioned  Home Living                                          Prior Functioning/Environment              Frequency  Min 1X/week        Progress Toward Goals  OT Goals(current goals can now be found in the  care plan section)  Progress towards OT goals: OT to reassess next treatment  Acute Rehab OT Goals Patient Stated Goal: get stronger  OT Goal Formulation: With patient Time For Goal Achievement: 04/29/17 Potential to Achieve Goals: Good  Plan Discharge plan remains appropriate;Frequency remains appropriate    Co-evaluation                 AM-PAC PT "6 Clicks" Daily Activity     Outcome Measure   Help from another person eating meals?: A Little Help from another person taking care of personal grooming?: A Little Help from another person toileting, which includes using toliet, bedpan, or urinal?: A Little Help from another person bathing (including washing, rinsing, drying)?: A Lot Help from another person to put on and taking off regular upper body clothing?: A Little Help from another person to put on and taking off regular lower body clothing?: A Lot 6 Click Score: 16    End of Session    OT Visit Diagnosis: Other abnormalities of gait and mobility (R26.89);Pain Pain - Right/Left: Right Pain - part of body: Hip   Activity Tolerance Patient tolerated treatment well   Patient Left in chair;with call bell/phone within reach;with chair alarm set   Nurse Communication Other (comment) (pt status)        Time: 3846-6599 OT Time Calculation (min): 17 min  Charges: OT General Charges $OT Visit: 1 Visit OT Treatments $Self Care/Home Management : 8-22 mins  Jeni Salles, MPH, MS, OTR/L ascom [phone] 04/16/17, 4:14 PM

## 2017-04-17 LAB — GLUCOSE, CAPILLARY
GLUCOSE-CAPILLARY: 121 mg/dL — AB (ref 65–99)
Glucose-Capillary: 130 mg/dL — ABNORMAL HIGH (ref 65–99)

## 2017-04-17 MED ORDER — TRAMADOL HCL 50 MG PO TABS: 50.0000 mg | ORAL_TABLET | ORAL | 0 refills | Status: DC | PRN

## 2017-04-17 NOTE — Progress Notes (Signed)
   Subjective: 3 Days Post-Op Procedure(s) (LRB): TOTAL HIP ARTHROPLASTY (Right) Patient reports pain as mild.   Patient is well, and has had no acute complaints or problems Patient did fair with physical therapy yesterday. He refused physical therapy and after knee secondary to being tired. Sacrum pad ordered yesterday but was not applied. It is in the room. Patient urinated on his TED stockings yesterday. These were removed and were not reapplied and have it off for 24 hours. Patient continues to spend most of his time in a chair versus the bed. Plan is to go Rehab after hospital stay. no nausea and no vomiting Patient denies any chest pains or shortness of breath. Objective: Vital signs in last 24 hours: Temp:  [97.9 F (36.6 C)-98 F (36.7 C)] 98 F (36.7 C) (10/24 2014) Pulse Rate:  [94-109] 109 (10/24 2014) Resp:  [19-20] 19 (10/24 2014) BP: (104-126)/(60-70) 126/60 (10/24 2014) SpO2:  [96 %-97 %] 97 % (10/24 2014) well approximated incision No tissue breakdown noted to the buttock region but is noted to have erythematous tissue. Heels are non tender and elevated off the bed using rolled towels Intake/Output from previous day: 10/24 0701 - 10/25 0700 In: 720 [P.O.:720] Out: 1120 [Urine:1120] Intake/Output this shift: No intake/output data recorded.   Recent Labs  04/15/17 0435 04/16/17 0610  HGB 12.7* 12.6*    Recent Labs  04/15/17 0435 04/16/17 0610  WBC 11.1* 10.0  RBC 4.12* 4.09*  HCT 37.8* 37.8*  PLT 229 234    Recent Labs  04/15/17 0435 04/16/17 0610  NA 139 141  K 4.5 4.2  CL 104 104  CO2 25 28  BUN 19 21*  CREATININE 1.03 1.15  GLUCOSE 135* 136*  CALCIUM 8.6* 8.9   No results for input(s): LABPT, INR in the last 72 hours.  EXAM General - Patient is Alert, Appropriate and Oriented Extremity - Neurologically intact Neurovascular intact Sensation intact distally Intact pulses distally Dorsiflexion/Plantar flexion intact No cellulitis  present Compartment soft Dressing - No change from yesterday. Main area of drainage was from the Hemovac site which has stopped draining. Motor Function - intact, moving foot and toes well on exam.    Past Medical History:  Diagnosis Date  . Arthritis   . Depression   . Diabetes mellitus without complication ([place])   . Difficult intubation   . GERD (gastroesophageal reflux disease)   . Heart murmur   . Hypertension   . Macular degeneration    Right  . Neuropathy   . OSA on CPAP   . Prostate cancer ([place])     Assessment/Plan: 3 Days Post-Op Procedure(s) (LRB): TOTAL HIP ARTHROPLASTY (Right) Active Problems:   Status post total replacement of hip  Estimated body mass index is 29.84 kg/m as calculated from the following:   Height as of this encounter: 6' (1.829 m).   Weight as of this encounter: 99.8 kg (220 lb). Up with therapy Discharge to SNF  Labs: None DVT Prophylaxis - Lovenox, Foot Pumps and TED hose Weight-Bearing as tolerated to right leg Sacrum pad to be applied today Patient needs to have a bowel movement prior to being discharged TED stockings are to be on both legs bilaterally.  Jillyn Ledger. [place] 04/17/2017, 7:25 AM

## 2017-04-17 NOTE — Progress Notes (Signed)
Patient is medically stable for D/C to [facility] SN[facility] today. Per Neoma Laming admissions coordinator at [facility] patient can come today to room 322. RN will call report to [facility] and arrange EMS for transport. Per patient he has a home cpap machine at bedside that will go with him to [facility]. Clinical Education officer, museum (CSW) sent D/C orders to [facility] via [place]. Patient is aware of above. CSW attempted to contact patient's wife Connell Bognar however she did not answer and a voicemail was left. Please reconsult if future social work needs arise. CSW signing off.   [employer], LCSW [phone]

## 2017-04-17 NOTE — Clinical Social Work Placement (Signed)
   CLINICAL SOCIAL WORK PLACEMENT  NOTE  Date:  04/17/2017  Patient Details  Name: Andrew Obrien MRN: [id number] Date of Birth: 1935-02-20  Clinical Social Work is seeking post-discharge placement for this patient at the [place] level of care (*CSW will initial, date and re-position this form in  chart as items are completed):  Yes   Patient/family provided with [facility] Work Department's list of facilities offering this level of care within the geographic area requested by the patient (or if unable, by the patient's family).  Yes   Patient/family informed of their freedom to choose among providers that offer the needed level of care, that participate in Medicare, Medicaid or managed care program needed by the patient, have an available bed and are willing to accept the patient.  Yes   Patient/family informed of [facility]'s ownership interest in [facility] and [facility], as well as of the fact that they are under no obligation to receive care at these facilities.  PASRR submitted to EDS on 04/14/17     PASRR number received on 04/14/17     Existing PASRR number confirmed on       FL2 transmitted to all facilities in geographic area requested by pt/family on 04/15/17     FL2 transmitted to all facilities within larger geographic area on       Patient informed that his/her managed care company has contracts with or will negotiate with certain facilities, including the following:        Yes   Patient/family informed of bed offers received.  Patient chooses bed at  [facility] )     Physician recommends and patient chooses bed at      Patient to be transferred to  [facility] ) on 04/17/17.  Patient to be transferred to facility by  [facility] )     Patient family notified on 04/17/17 of transfer.  Name of family member notified:   ([place] left patient's wife Andrew Obrien a voicemail. )      PHYSICIAN       Additional Comment:    _______________________________________________ Ulyses Panico, Veronia Beets, LCSW 04/17/2017, 9:42 AM

## 2017-04-17 NOTE — Progress Notes (Signed)
Physical Therapy Treatment Patient Details Name: Andrew Obrien MRN: [id number] DOB: 02-27-1935 Today's Date: 04/17/2017    History of Present Illness Pt is an 82 yo M diagnosed with degenerative arthrosis of the righthip who is now s/p elective righttotal hip arthroplasty.  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      PT Comments    Pt presented in bed agreeable to therapy. Pt indicated feels much better than yesterday pm. Pt able to recall 3/3 precautions and demonstrated improved overall mobility with decreased assist. He was able to increase ambulation distance with RW with step to pattern and continued antalgic gait. He was able to tolerate supine and seated therex without significant increase in pain. He anticipates d/c to SNF today and would continue to benefit from skilled PT to increase ROM and strength to improve overall safety and functional mobility tolerance.    Follow Up Recommendations  SNF     Equipment Recommendations  None recommended by PT    Recommendations for Other Services       Precautions / Restrictions Precautions Precautions: Fall;Posterior Hip Restrictions Weight Bearing Restrictions: Yes RLE Weight Bearing: Weight bearing as tolerated    Mobility  Bed Mobility Overal bed mobility: Needs Assistance Bed Mobility: Supine to Sit;Sit to Supine     Supine to sit: Min assist Sit to supine: Supervision   General bed mobility comments: Maintained hip precautions, required minA for truncal support supine to sit  Transfers Overall transfer level: Needs assistance Equipment used: Rolling walker (2 wheeled) Transfers: Sit to/from Stand Sit to Stand: Min guard         General transfer comment: min cues for hand placement  Ambulation/Gait Ambulation/Gait assistance: Min guard   Assistive device: Rolling walker (2 wheeled) Gait Pattern/deviations: Step-through  pattern;Decreased step length - right;Decreased step length - left;Decreased stance time - right     General Gait Details: decreasesd wt shift to [employer] Mobility    Modified Rankin (Stroke Patients Only)       Balance Overall balance assessment: Needs assistance Sitting-balance support: No upper extremity supported;Feet supported Sitting balance-Leahy Scale: Good     Standing balance support: Bilateral upper extremity supported Standing balance-Leahy Scale: Good                              Cognition Arousal/Alertness: Awake/alert Behavior During Therapy: WFL for tasks assessed/performed Overall Cognitive Status: Within Functional Limits for tasks assessed                                        Exercises Total Joint Exercises Ankle Circles/Pumps: AROM;15 reps Quad Sets: AROM;Both;15 reps;Supine Hip ABduction/ADduction: AROM;Strengthening;Both;10 reps;Supine Straight Leg Raises: AAROM;Strengthening;Both;10 reps;Supine Long Arc Quad: Strengthening;Both;15 reps;Seated    General Comments        Pertinent Vitals/Pain Pain Location: R hip Pain Descriptors / Indicators: Operative site guarding    Home Living                      Prior Function            PT Goals (current goals can now be found in the care plan section) Acute  Rehab PT Goals Patient Stated Goal: get stronger  Potential to Achieve Goals: Good    Frequency    BID      PT Plan Current plan remains appropriate    Co-evaluation              AM-PAC PT "6 Clicks" Daily Activity  Outcome Measure  Difficulty turning over in bed (including adjusting bedclothes, sheets and blankets)?: A Little Difficulty moving from lying on back to sitting on the side of the bed? : Unable Difficulty sitting down on and standing up from a chair with arms (e.g., wheelchair, bedside commode, etc,.)?: A Little Help needed moving to and  from a bed to chair (including a wheelchair)?: A Little Help needed walking in hospital room?: A Little Help needed climbing 3-5 steps with a railing? : A Little 6 Click Score: 16    End of Session Equipment Utilized During Treatment: Gait belt Activity Tolerance: Patient tolerated treatment well Patient left: in bed;with call bell/phone within reach;with bed alarm set   PT Visit Diagnosis: Other abnormalities of gait and mobility (R26.89)     Time: 955 - 1020     Charges:                Curtistine Pettitt 04/17/2017, 1:40 PM

## 2017-04-17 NOTE — Progress Notes (Signed)
DISCHARGE NOTE:  Pt given to [facility], EMS called for transportation

## 2017-10-28 DIAGNOSIS — M65342 Trigger finger, left ring finger: Secondary | ICD-10-CM | POA: Insufficient documentation

## 2017-11-28 DIAGNOSIS — Z0000 Encounter for general adult medical examination without abnormal findings: Secondary | ICD-10-CM | POA: Insufficient documentation

## 2017-11-28 DIAGNOSIS — Z66 Do not resuscitate: Secondary | ICD-10-CM | POA: Insufficient documentation

## 2017-12-10 DIAGNOSIS — R29898 Other symptoms and signs involving the musculoskeletal system: Secondary | ICD-10-CM | POA: Insufficient documentation

## 2017-12-10 DIAGNOSIS — R200 Anesthesia of skin: Secondary | ICD-10-CM | POA: Insufficient documentation

## 2018-01-06 ENCOUNTER — Other Ambulatory Visit: Payer: Self-pay

## 2018-01-06 ENCOUNTER — Encounter: Payer: Self-pay | Admitting: Student in an Organized Health Care Education/Training Program

## 2018-01-06 ENCOUNTER — Ambulatory Visit
Payer: Medicare Other | Attending: Student in an Organized Health Care Education/Training Program | Admitting: Student in an Organized Health Care Education/Training Program

## 2018-01-06 VITALS — BP 149/56 | HR 71 | Temp 97.7°F | Resp 16 | Ht 72.0 in | Wt 230.0 lb

## 2018-01-06 DIAGNOSIS — G894 Chronic pain syndrome: Secondary | ICD-10-CM | POA: Insufficient documentation

## 2018-01-06 DIAGNOSIS — Z9889 Other specified postprocedural states: Secondary | ICD-10-CM | POA: Diagnosis not present

## 2018-01-06 DIAGNOSIS — Z79891 Long term (current) use of opiate analgesic: Secondary | ICD-10-CM | POA: Diagnosis not present

## 2018-01-06 DIAGNOSIS — Z96641 Presence of right artificial hip joint: Secondary | ICD-10-CM | POA: Diagnosis not present

## 2018-01-06 DIAGNOSIS — Z96651 Presence of right artificial knee joint: Secondary | ICD-10-CM | POA: Diagnosis not present

## 2018-01-06 DIAGNOSIS — Z96611 Presence of right artificial shoulder joint: Secondary | ICD-10-CM | POA: Insufficient documentation

## 2018-01-06 DIAGNOSIS — Z79899 Other long term (current) drug therapy: Secondary | ICD-10-CM | POA: Diagnosis not present

## 2018-01-06 DIAGNOSIS — M961 Postlaminectomy syndrome, not elsewhere classified: Secondary | ICD-10-CM | POA: Diagnosis not present

## 2018-01-06 DIAGNOSIS — M79604 Pain in right leg: Secondary | ICD-10-CM | POA: Diagnosis present

## 2018-01-06 DIAGNOSIS — Z9689 Presence of other specified functional implants: Secondary | ICD-10-CM | POA: Insufficient documentation

## 2018-01-06 DIAGNOSIS — M170 Bilateral primary osteoarthritis of knee: Secondary | ICD-10-CM | POA: Diagnosis not present

## 2018-01-06 DIAGNOSIS — G4733 Obstructive sleep apnea (adult) (pediatric): Secondary | ICD-10-CM | POA: Insufficient documentation

## 2018-01-06 DIAGNOSIS — M792 Neuralgia and neuritis, unspecified: Secondary | ICD-10-CM | POA: Insufficient documentation

## 2018-01-06 DIAGNOSIS — K219 Gastro-esophageal reflux disease without esophagitis: Secondary | ICD-10-CM | POA: Diagnosis not present

## 2018-01-06 DIAGNOSIS — Z8546 Personal history of malignant neoplasm of prostate: Secondary | ICD-10-CM | POA: Diagnosis not present

## 2018-01-06 DIAGNOSIS — I10 Essential (primary) hypertension: Secondary | ICD-10-CM | POA: Diagnosis not present

## 2018-01-06 DIAGNOSIS — Z969 Presence of functional implant, unspecified: Secondary | ICD-10-CM | POA: Diagnosis not present

## 2018-01-06 DIAGNOSIS — E119 Type 2 diabetes mellitus without complications: Secondary | ICD-10-CM | POA: Diagnosis not present

## 2018-01-06 MED ORDER — DICLOFENAC SODIUM 1 % TD GEL
4.0000 g | Freq: Four times a day (QID) | TRANSDERMAL | 2 refills | Status: AC
Start: 2018-01-06 — End: 2018-04-06

## 2018-01-06 NOTE — Progress Notes (Signed)
Safety precautions to be maintained throughout the outpatient stay will include: orient to surroundings, keep bed in low position, maintain call bell within reach at all times, provide assistance with transfer out of bed and ambulation.  

## 2018-01-06 NOTE — Patient Instructions (Addendum)
Voltaren gel ( with 2 refills) has been escribed to your pharmacy.

## 2018-01-06 NOTE — Progress Notes (Signed)
Patient's Name: Andrew Obrien  MRN: [id number]  Referring Provider: Dereck Leep, MD  DOB: [birth year]  PCP: Dion Body, MD  DOS: 01/06/2018  Note by: Gillis Santa, MD  Service setting: Ambulatory outpatient  Specialty: Interventional Pain Management  Location: [facility] (AMB) Pain Management Facility  Visit type: Initial Patient Evaluation  Patient type: New Patient   Primary Obrien(s) for Visit: Encounter for initial evaluation of one or more chronic problems (new to examiner) potentially causing chronic pain, and posing a threat to normal musculoskeletal function. (Level of risk: High) CC: Leg Pain (right)  HPI  Andrew Obrien is a 83 y.o. year old, male patient, who comes today to see [place] for the first time for an initial evaluation of his chronic pain. He has Status post total replacement of hip; History of total right knee replacement (x2); Failed back surgical syndrome; Bilateral primary osteoarthritis of knee; Neuropathic pain; Spinal cord stimulator status; and Chronic pain syndrome on their problem list. Today he comes in for evaluation of his Leg Pain (right)  Pain Assessment: Location: Right Leg Radiating: down the side of right leg not involving foot Onset: More than a month ago Duration: Chronic pain Quality: Aching, Burning, Constant, Sharp, Shooting Severity: 8 /10 (subjective, self-reported pain score)  Note: Reported level is inconsistent with clinical observations.                         When using our objective Pain Scale, levels between 6 and 10/10 are said to belong in an emergency room, as it progressively worsens from a 6/10, described as severely limiting, requiring emergency care not usually available at an outpatient pain management facility. At a 6/10 level, communication becomes difficult and requires great effort. Assistance to reach the emergency department may be required. Facial flushing and profuse sweating along with potentially dangerous increases in heart  rate and blood pressure will be evident. Effect on ADL: some mornings pain so bad pt cannot get out of bed Timing: Constant Modifying factors: nothing helps BP: (!) 149/56  HR: 71  Onset and Duration: Date of onset: 2010 Cause of pain: Unknown Severity: Getting worse, NAS-11 at its worse: 8/10, NAS-11 at its best: 7/10, NAS-11 now: 7/10 and NAS-11 on the average: 8/10 Timing: Morning and After activity or exercise Aggravating Factors: Bending, Kneeling, Lifiting, Motion, Prolonged sitting, Prolonged standing, Squatting, Stooping , Surgery made it worse, Twisting, Walking, Walking uphill and Walking downhill Alleviating Factors: Cold packs, Medications, Resting, Using a brace and Warm showers or baths Associated Problems: Dizziness, Inability to control bladder (urine), Inability to control bowel, Numbness, Spasms, Temperature changes, Tingling, Weakness, Pain that wakes patient up and Pain that does not allow patient to sleep Quality of Pain: Aching, Agonizing, Annoying, Constant, Deep, Exhausting, Itching, Nagging, Pulsating, Sharp, Shooting, Stabbing, Tingling, Tiring and Uncomfortable Previous Examinations or Tests: Biopsy, Bone scan, CT scan, EMG/PNCV, Nerve block, X-rays, Nerve conduction test, Orthopedic evaluation and Chiropractic evaluation Previous Treatments: Narcotic medications  The patient comes into the clinics today for the first time for a chronic pain management evaluation.   83 year old male who presents as a referral from Dr. Marry Guan with the primary complaint of bilateral knee pain, right greater than left along with axial low back pain.  Patient's bilateral knee pain is secondary to bilateral knee osteoarthritis, he is status post 2 right knee replacement surgeries, one being a revision surgery due to complication of hardware.  Patient also had lumbar spine surgery and went on  to have a spinal cord stimulator placed for worsening pain secondary to failed back surgical  syndrome.  His current spinal cord stimulator is [employer].  Patient does have a history of sleep apnea and is currently on CPAP at night.  Patient also had a right hip replacement surgery on October 22.  He also has a history of rotator cuff repairs in the past.  Last prescription for opioid medication was tramadol 50 mg, quantity 21, last fill was 07/14/2017.  On chronic opioid therapy.  Today I took the time to provide the patient with information regarding my pain practice. The patient was informed that my practice is divided into two sections: an interventional pain management section, as well as a completely separate and distinct medication management section. I explained that I have procedure days for my interventional therapies, and evaluation days for follow-ups and medication management. Because of the amount of documentation required during both, they are kept separated. This means that there is the possibility that he may be scheduled for a procedure on one day, and medication management the next. I have also informed him that because of staffing and facility limitations, I no longer take patients for medication management only. To illustrate the reasons for this, I gave the patient the example of surgeons, and how inappropriate it would be to refer a patient to his/her care, just to write for the post-surgical antibiotics on a surgery done by a different surgeon.   Because interventional pain management is my board-certified specialty, the patient was informed that joining my practice means that they are open to any and all interventional therapies. I made it clear that this does not mean that they will be forced to have any procedures done. What this means is that I believe interventional therapies to be essential part of the diagnosis and proper management of chronic pain conditions. Therefore, patients not interested in these interventional alternatives will be better served under the care  of a different practitioner.  The patient was also made aware of my Comprehensive Pain Management Safety Guidelines where by joining my practice, they limit all of their nerve blocks and joint injections to those done by our practice, for as long as we are retained to manage their care.   Historical Background Evaluation: [place] PMP: Six (6) year initial data search conducted.             Risk Assessment Profile: Aberrant behavior: None observed or detected today Risk factors for fatal opioid overdose: None identified today Fatal overdose hazard ratio (HR): Calculation deferred Non-fatal overdose hazard ratio (HR): Calculation deferred Risk of opioid abuse or dependence: 0.7-3.0% with doses ? 36 MME/day and 6.1-26% with doses ? 120 MME/day. Substance use disorder (SUD) risk level: Low Opioid risk tool (ORT) (Total Score): 3 Opioid Risk Tool - 01/06/18 1054      Family History of Substance Abuse   Alcohol  Positive Male    Illegal Drugs  Negative    Rx Drugs  Negative      Personal History of Substance Abuse   Alcohol  Negative    Illegal Drugs  Negative    Rx Drugs  Negative      Age   Age between 19-45 years   No      History of Preadolescent Sexual Abuse   History of Preadolescent Sexual Abuse  Negative or Male      Psychological Disease   Psychological Disease  Negative    Depression  Negative  Total Score   Opioid Risk Tool Scoring  3    Opioid Risk Interpretation  Low Risk      ORT Scoring interpretation table:  Score <3 = Low Risk for SUD  Score between 4-7 = Moderate Risk for SUD  Score >8 = High Risk for Opioid Abuse   PHQ-2 Depression Scale:  Total score: 0  PHQ-2 Scoring interpretation table: (Score and probability of major depressive disorder)  Score 0 = No depression  Score 1 = 15.4% Probability  Score 2 = 21.1% Probability  Score 3 = 38.4% Probability  Score 4 = 45.5% Probability  Score 5 = 56.4% Probability  Score 6 = 78.6% Probability   PHQ-9  Depression Scale:  Total score: 0  PHQ-9 Scoring interpretation table:  Score 0-4 = No depression  Score 5-9 = Mild depression  Score 10-14 = Moderate depression  Score 15-19 = Moderately severe depression  Score 20-27 = Severe depression (2.4 times higher risk of SUD and 2.89 times higher risk of overuse)   Pharmacologic Plan: As per protocol, I have not taken over any controlled substance management, pending the results of ordered tests and/or consults.            Initial impression: Pending review of available data and ordered tests.  Meds   Current Outpatient Medications:  .  allopurinol (ZYLOPRIM) 300 MG tablet, Take 300 mg by mouth daily., Disp: , Rfl:  .  azelastine (ASTELIN) 0.1 % nasal spray, Place 1 spray into both nostrils 2 (two) times daily. Use in each nostril as directed, Disp: , Rfl:  .  Azelastine-Fluticasone (DYMISTA) 137-50 MCG/ACT SUSP, Place 1 spray into the nose at bedtime., Disp: , Rfl:  .  Calcium Carb-Cholecalciferol (CALCIUM 600+D) 600-800 MG-UNIT TABS, Take 1 capsule by mouth 2 (two) times daily., Disp: , Rfl:  .  calcium-vitamin D (OSCAL WITH D) 500-200 MG-UNIT tablet, Take 1 tablet by mouth 2 (two) times daily., Disp: , Rfl:  .  cholecalciferol (VITAMIN D) 1000 units tablet, Take 1,000 Units by mouth 2 (two) times daily., Disp: , Rfl:  .  DULoxetine (CYMBALTA) 30 MG capsule, Take 30 mg by mouth 2 (two) times daily., Disp: , Rfl:  .  enoxaparin (LOVENOX) 40 MG/0.4ML injection, Inject 0.4 mLs (40 mg total) into the skin daily., Disp: 14 Syringe, Rfl: 0 .  fluocinolone (SYNALAR) 0.01 % external solution, Apply 1 drop topically 2 (two) times daily as needed (uses in ears)., Disp: , Rfl:  .  fluticasone (FLONASE) 50 MCG/ACT nasal spray, Place 2 sprays into both nostrils at bedtime., Disp: , Rfl:  .  Glucosamine-Chondroit-Vit C-Mn (GLUCOSAMINE CHONDR 1500 COMPLX PO), Take 1 tablet by mouth 2 (two) times daily., Disp: , Rfl:  .  Melatonin 5 MG TABS, Take 1 tablet by  mouth at bedtime., Disp: , Rfl:  .  metFORMIN (GLUCOPHAGE) 1000 MG tablet, Take 1,000 mg by mouth 2 (two) times daily with a meal., Disp: , Rfl:  .  mirabegron ER (MYRBETRIQ) 50 MG TB24 tablet, Take 50 mg by mouth daily., Disp: , Rfl:  .  Multiple Vitamins-Minerals (MULTIVITAMIN WITH MINERALS) tablet, Take 1 tablet by mouth daily., Disp: , Rfl:  .  NON FORMULARY, Take 1 tablet by mouth daily. [employer], Disp: , Rfl:  .  olmesartan-hydrochlorothiazide (BENICAR HCT) 40-12.5 MG tablet, Take 1 tablet by mouth daily., Disp: , Rfl:  .  omeprazole (PRILOSEC) 20 MG capsule, Take 20 mg by mouth every morning., Disp: , Rfl:  .  Potassium  99 MG TABS, Take 1 tablet by mouth daily., Disp: , Rfl:  .  pregabalin (LYRICA) 100 MG capsule, Take 100 mg by mouth 2 (two) times daily., Disp: , Rfl:  .  rOPINIRole (REQUIP) 1 MG tablet, Take 1 mg by mouth at bedtime., Disp: , Rfl:  .  rosuvastatin (CRESTOR) 10 MG tablet, Take 10 mg by mouth every evening., Disp: , Rfl:  .  tamsulosin (FLOMAX) 0.4 MG CAPS capsule, Take 0.4 mg by mouth daily., Disp: , Rfl:  .  temazepam (RESTORIL) 7.5 MG capsule, Take 7.5 mg by mouth at bedtime., Disp: , Rfl:  .  traMADol (ULTRAM) 50 MG tablet, Take 1-2 tablets (50-100 mg total) by mouth every 4 (four) hours as needed for moderate pain., Disp: 60 tablet, Rfl: 0 .  traMADol (ULTRAM) 50 MG tablet, Take 1-2 tablets (50-100 mg total) by mouth every 4 (four) hours as needed (mild pain)., Disp: 30 tablet, Rfl: 0 .  vitamin C (ASCORBIC ACID) 500 MG tablet, Take 500 mg by mouth daily., Disp: , Rfl:  .  calcipotriene (DOVONOX) 0.005 % cream, Apply 1 application topically 2 (two) times daily., Disp: , Rfl:  .  diclofenac sodium (VOLTAREN) 1 % GEL, Apply 4 g topically 4 (four) times daily., Disp: 100 g, Rfl: 2 .  oxyCODONE (OXY IR/ROXICODONE) 5 MG immediate release tablet, Take 1 tablet (5 mg total) by mouth every 3 (three) hours as needed for moderate pain ((score 4 to 6)). (Patient not taking:  Reported on 01/06/2018), Disp: 60 tablet, Rfl: 0  Imaging Review  Hip-R DG 2-3 views:  Results for orders placed during the hospital encounter of 12/04/16  DG Hip Unilat W or Wo Pelvis 2-3 Views Right   Narrative CLINICAL DATA:  Fall last night.  Right hip and knee pain.  EXAM: DG HIP (WITH OR WITHOUT PELVIS) 2-3V RIGHT  COMPARISON:  None.  FINDINGS: The bones appear mildly demineralized. No evidence of acute fracture or dislocation. There is advanced asymmetric right hip arthropathy with joint space narrowing, subchondral sclerosis and cyst formation. Lower lumbar spondylosis and diffuse vascular calcifications are noted.  IMPRESSION: No acute osseous findings demonstrated. Advanced asymmetric right hip arthropathy.   Electronically Signed   By: Richardean Sale M.D.   On: 12/04/2016 11:03     Knee-R DG 4 views:  Results for orders placed during the hospital encounter of 12/04/16  DG Knee Complete 4 Views Right   Narrative CLINICAL DATA:  Right knee gave out last night with resulting fall. Right knee and hip pain. History of right total knee arthroplasty.  EXAM: RIGHT KNEE - COMPLETE 4+ VIEW  COMPARISON:  None.  FINDINGS: The bones appear mildly demineralized. Patient is status post total knee arthroplasty. The hardware is intact without evidence of loosening. No evidence of acute fracture or dislocation. Possible small joint effusion on the lateral view. Mild vascular calcifications are noted.  IMPRESSION: No acute osseous findings.   Electronically Signed   By: Richardean Sale M.D.   On: 12/04/2016 11:02     Complexity Note: Imaging results reviewed. Results shared with Andrew Obrien, using Layman's terms.                         ROS  Cardiovascular: Abnormal heart rhythm, Daily Aspirin intake, High blood pressure, Heart murmur and Needs antibiotics prior to dental procedures Pulmonary or Respiratory: Snoring  Neurological: Abnormal skin sensations  (Peripheral Neuropathy) and Incontinence:  Urinary Review of Past Neurological  Studies: No results found for this or any previous visit. Psychological-Psychiatric: Difficulty sleeping and or falling asleep Gastrointestinal: Heartburn due to stomach pushing into lungs (Hiatal hernia), Reflux or heatburn, Alternating episodes iof diarrhea and constipation (IBS-Irritable bowe syndrome) and Irregular, infrequent bowel movements (Constipation) Genitourinary: Passing kidney stones Hematological: No reported hematological signs or symptoms such as prolonged bleeding, low or poor functioning platelets, bruising or bleeding easily, hereditary bleeding problems, low energy levels due to low hemoglobin or being anemic Endocrine: High blood sugar requiring insulin (IDDM) Rheumatologic: Rheumatoid arthritis Musculoskeletal: Negative for myasthenia gravis, muscular dystrophy, multiple sclerosis or malignant hyperthermia Work History: Retired  Allergies  Andrew Obrien has No Known Allergies.  Laboratory Chemistry  Inflammation Markers (CRP: Acute Phase) (ESR: Chronic Phase) Lab Results  Component Value Date   CRP 1.1 (H) 04/02/2017   ESRSEDRATE 25 (H) 04/02/2017                         Rheumatology Markers No results found for: RF, ANA, LABURIC, URICUR, LYMEIGGIGMAB, LYMEABIGMQN, HLAB27                      Renal Function Markers Lab Results  Component Value Date   BUN 21 (H) 04/16/2017   CREATININE 1.15 04/16/2017   GFRAA >60 04/16/2017   GFRNONAA 58 (L) 04/16/2017                             Hepatic Function Markers Lab Results  Component Value Date   AST 21 04/02/2017   ALT 22 04/02/2017   ALBUMIN 4.0 04/02/2017   ALKPHOS 81 04/02/2017                        Electrolytes Lab Results  Component Value Date   NA 141 04/16/2017   K 4.2 04/16/2017   CL 104 04/16/2017   CALCIUM 8.9 04/16/2017                        Neuropathy Markers Lab Results  Component Value Date   HGBA1C 8.0  (H) 04/02/2017                        Bone Pathology Markers No results found for: VD25OH, [id number], YN8295AO1, [id number], 25OHVITD1, 25OHVITD2, 25OHVITD3, TESTOFREE, TESTOSTERONE                       Coagulation Parameters Lab Results  Component Value Date   INR 0.96 04/02/2017   LABPROT 12.7 04/02/2017   APTT 33 04/02/2017   PLT 234 04/16/2017                        Cardiovascular Markers Lab Results  Component Value Date   HGB 12.6 (L) 04/16/2017   HCT 37.8 (L) 04/16/2017                         CA Markers No results found for: CEA, CA125, LABCA2                      Note: Lab results reviewed.  [place]  Drug: Andrew Obrien  reports that he does not use drugs. Alcohol:  reports that he does not drink alcohol. Tobacco:  reports that he has never  smoked. He has never used smokeless tobacco. Medical:  has a past medical history of Arthritis, Depression, Diabetes mellitus without complication ([place]), Difficult intubation, GERD (gastroesophageal reflux disease), Heart murmur, Hypertension, Macular degeneration, Neuropathy, OSA on CPAP, and Prostate cancer ([place]). Family: family history is not on file.  Past Surgical History:  Procedure Laterality Date  . CATARACT EXTRACTION W/ INTRAOCULAR LENS  IMPLANT, BILATERAL    . LUMBAR SPINE SURGERY    . MOUTH SURGERY    . PAIN PUMP IMPLANTATION    . prostate cancer    . REPLACEMENT TOTAL KNEE Right    x 2  . ROTATOR CUFF REPAIR Left   . SKIN CANCER EXCISION    . TOTAL HIP ARTHROPLASTY Right 04/14/2017   Procedure: TOTAL HIP ARTHROPLASTY;  Surgeon: Dereck Leep, MD;  Location: [facility] ORS;  Service: Orthopedics;  Laterality: Right;  . TOTAL SHOULDER REPLACEMENT Left    Active Ambulatory Problems    Diagnosis Date Noted  . Status post total replacement of hip 04/14/2017  . History of total right knee replacement (x2) 01/06/2018  . 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 Bilateral primary osteoarthritis of knee 01/06/2018  .  Neuropathic pain 01/06/2018  . Spinal cord stimulator status 01/06/2018  . Chronic pain syndrome 01/06/2018   Resolved Ambulatory Problems    Diagnosis Date Noted  . No Resolved Ambulatory Problems   Past Medical History:  Diagnosis Date  . Arthritis   . Depression   . Diabetes mellitus without complication ([place])   . Difficult intubation   . GERD (gastroesophageal reflux disease)   . Heart murmur   . Hypertension   . Macular degeneration   . Neuropathy   . OSA on CPAP   . Prostate cancer ([place])    Constitutional Exam  General appearance: Well nourished, well developed, and well hydrated. In no apparent acute distress Vitals:   01/06/18 1033  BP: (!) 149/56  Pulse: 71  Resp: 16  Temp: 97.7 F (36.5 C)  TempSrc: Oral  SpO2: 96%  Weight: 230 lb (104.3 kg)  Height: 6' (1.829 m)   BMI Assessment: Estimated body mass index is 31.19 kg/m as calculated from the following:   Height as of this encounter: 6' (1.829 m).   Weight as of this encounter: 230 lb (104.3 kg).  BMI interpretation table: BMI level Category Range association with higher incidence of chronic pain  <18 kg/m2 Underweight   18.5-24.9 kg/m2 Ideal body weight   25-29.9 kg/m2 Overweight Increased incidence by 20%  30-34.9 kg/m2 Obese (Class I) Increased incidence by 68%  35-39.9 kg/m2 Severe obesity (Class II) Increased incidence by 136%  >40 kg/m2 Extreme obesity (Class III) Increased incidence by 254%   Patient's current BMI Ideal Body weight  Body mass index is 31.19 kg/m. Ideal body weight: 77.6 kg (171 lb 1.2 oz) Adjusted ideal body weight: 88.3 kg (194 lb 10.3 oz)   BMI Readings from Last 4 Encounters:  01/06/18 31.19 kg/m  04/14/17 29.84 kg/m  04/02/17 29.70 kg/m  12/04/16 29.43 kg/m   Wt Readings from Last 4 Encounters:  01/06/18 230 lb (104.3 kg)  04/14/17 220 lb (99.8 kg)  04/02/17 219 lb (99.3 kg)  12/04/16 217 lb (98.4 kg)  Psych/Mental status: Alert, oriented x 3 (person, place, &  time)       Eyes: PERLA Respiratory: Oxygen-dependent COPD  Cervical Spine Area Exam  Skin & Axial Inspection: No masses, redness, edema, swelling, or associated skin lesions Alignment: Symmetrical Functional ROM: Decreased  ROM      Stability: No instability detected Muscle Tone/Strength: Functionally intact. No obvious neuro-muscular anomalies detected. Sensory (Neurological): Musculoskeletal pain pattern Palpation: Complains of area being tender to palpation Positive provocative maneuver for for cervical facet disease  Upper Extremity (UE) Exam    Side: Right upper extremity  Side: Left upper extremity  Skin & Extremity Inspection: Evidence of prior arthroplastic surgery  Skin & Extremity Inspection: Evidence of prior arthroplastic surgery  Functional ROM: Decreased ROM for shoulder  Functional ROM: Decreased ROM for shoulder  Muscle Tone/Strength: Functionally intact. No obvious neuro-muscular anomalies detected.  Muscle Tone/Strength: Functionally intact. No obvious neuro-muscular anomalies detected.  Sensory (Neurological): Musculoskeletal pain pattern          Sensory (Neurological): Musculoskeletal pain pattern          Palpation: No palpable anomalies              Palpation: No palpable anomalies              Provocative Test(s):  Phalen's test: deferred Tinel's test: deferred Apley's scratch test (touch opposite shoulder):  Action 1 (Across chest): Decreased ROM Action 2 (Overhead): Decreased ROM Action 3 (LB reach): Decreased ROM   Provocative Test(s):  Phalen's test: deferred Tinel's test: deferred Apley's scratch test (touch opposite shoulder):  Action 1 (Across chest): Decreased ROM Action 2 (Overhead): Decreased ROM Action 3 (LB reach): Decreased ROM    Thoracic Spine Area Exam  Skin & Axial Inspection: No masses, redness, or swelling Alignment: Symmetrical Functional ROM: Unrestricted ROM Stability: No instability detected Muscle Tone/Strength: Functionally  intact. No obvious neuro-muscular anomalies detected. Sensory (Neurological): Unimpaired Muscle strength & Tone: No palpable anomalies  Lumbar Spine Area Exam  Skin & Axial Inspection: Well healed scar from previous spine surgery detected, spinal cord stimulator IPG palpated midline Alignment: Symmetrical Functional ROM: Decreased ROM       Stability: No instability detected Muscle Tone/Strength: Functionally intact. No obvious neuro-muscular anomalies detected. Sensory (Neurological): Dermatomal pain pattern and musculoskeletal Palpation: No palpable anomalies       Provocative Tests: Lumbar Hyperextension/rotation test: (+) bilaterally for facet joint pain. Lumbar quadrant test (Kemp's test): deferred today       Lumbar Lateral bending test: deferred today       Patrick's Maneuver: deferred today                   FABER test: deferred today                   Thigh-thrust test: deferred today       S-I compression test: deferred today       S-I distraction test: deferred today        Gait & Posture Assessment  Ambulation: Patient ambulates using a cane Gait: Limited. Using assistive device to ambulate Posture: Difficulty standing up straight, due to pain   Lower Extremity Exam    Side: Right lower extremity  Side: Left lower extremity  Stability: Unstable          Stability: Unstable          Skin & Extremity Inspection: Evidence of prior arthroplastic surgery  Skin & Extremity Inspection: Skin color, temperature, and hair growth are WNL. No peripheral edema or cyanosis. No masses, redness, swelling, asymmetry, or associated skin lesions. No contractures.  Functional ROM: Decreased ROM for knee joint          Functional ROM: Decreased ROM for knee joint  Muscle Tone/Strength: Functionally intact. No obvious neuro-muscular anomalies detected.  Muscle Tone/Strength: Functionally intact. No obvious neuro-muscular anomalies detected.  Sensory (Neurological): Arthropathic  arthralgia  Sensory (Neurological): Arthropathic arthralgia  Palpation: No palpable anomalies  Palpation: No palpable anomalies   Assessment  Primary Diagnosis & Pertinent Problem List: The primary encounter diagnosis was History of total right knee replacement (x2). Diagnoses of Failed back surgical syndrome, Bilateral primary osteoarthritis of knee, Neuropathic pain, Spinal cord stimulator status, and Chronic pain syndrome were also pertinent to this visit.  Visit Diagnosis (New problems to examiner): 1. History of total right knee replacement (x2)   2. Failed back surgical syndrome   3. Bilateral primary osteoarthritis of knee   4. Neuropathic pain   5. Spinal cord stimulator status   6. Chronic pain syndrome   General Recommendations: The pain condition that the patient suffers from is best treated with a multidisciplinary approach that involves an increase in physical activity to prevent de-conditioning and worsening of the pain cycle, as well as psychological counseling (formal and/or informal) to address the co-morbid psychological affects of pain. Treatment will often involve judicious use of pain medications and interventional procedures to decrease the pain, allowing the patient to participate in the physical activity that will ultimately produce long-lasting pain reductions. The goal of the multidisciplinary approach is to return the patient to a higher level of overall function and to restore their ability to perform activities of daily living.  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  Patient has been  told that he needs a left knee replacement surgery but he wants to avoid this.  In regards to his knee pain, the patient is tried intra-articular steroid injections as well as genicular nerve block greater than 3 years ago.  Patient states that the genicular nerve block was more effective for his knee pain than his knee steroid injections.  Patient usually ambulates with a walker or cane.  Patient has recently moved from [place].  He is hoping to establish pain management care in this region.  Patient is currently utilizing Lyrica for his neuropathic pain and finds this effective.  In regards to his bilateral knee pain, I discussed repeating bilateral genicular nerve block with possible radiofrequency ablation if diagnostic nerve block is effective.  Risks and benefits were discussed.  Patient is not on any blood thinners.  I will also contact our local [place] spinal cord stimulator representative so that he can reach out to the patient to try and reprogramming to optimize his SCS settings.  Consent and permission obtained from patient for SCS representative to contact patient.  Plan: -Bilateral genicular nerve block under fluoroscopy without sedation with possible radiofrequency ablation thereafter -We will contact [facility] spinal cord stimulator representative to contact patient so that they can meet to reprogram and optimize SCS settings  -Prescription for Voltaren gel that patient can apply to painful regions.  Plan of Care (Initial workup plan)  Note: Please be advised that as per protocol, today's visit has been an evaluation only. We have not taken over the patient's controlled substance management.  Ordered Lab-work, Procedure(s), Referral(s), & Consult(s): Orders Placed This Encounter  Procedures  .  GENICULAR NERVE BLOCK   Pharmacotherapy (current): Medications ordered:  Meds ordered this encounter  Medications  . diclofenac sodium (VOLTAREN) 1 % GEL    Sig: Apply  4 g topically 4 (four) times daily.    Dispense:  100 g    Refill:  2    Do not add this medication to the electronic "Automatic Refill" notification system. Patient may have prescription filled one day early if pharmacy is closed on scheduled refill date.   Medications administered during this visit: Andrew Obrien had no medications administered during this visit.   Pharmacological management options:  Opioid Analgesics: The patient was informed that there is no guarantee that he would be a candidate for opioid analgesics. The decision will be made following CDC guidelines. This decision will be based on the results of diagnostic studies, as well as Andrew Obrien's risk profile.   Membrane stabilizer: To be determined at a later time  Muscle relaxant: To be determined at a later time  NSAID: To be determined at a later time  Other analgesic(s): To be determined at a later time    Provider-requested follow-up: Return for Procedure.  Future Appointments  Date Time Provider [place]  01/26/2018  8:45 AM Gillis Santa, MD [facility] None    Primary Care Physician: Dion Body, MD Location: [facility] Note by: Gillis Santa, M.D, Date: 01/06/2018; Time: 1:29 PM  Patient Instructions  Voltaren gel ( with 2 refills) has been escribed to your pharmacy.

## 2018-01-26 ENCOUNTER — Other Ambulatory Visit: Payer: Self-pay

## 2018-01-26 ENCOUNTER — Encounter: Payer: Self-pay | Admitting: Student in an Organized Health Care Education/Training Program

## 2018-01-26 ENCOUNTER — Ambulatory Visit (HOSPITAL_BASED_OUTPATIENT_CLINIC_OR_DEPARTMENT_OTHER): Payer: Medicare Other | Admitting: Student in an Organized Health Care Education/Training Program

## 2018-01-26 ENCOUNTER — Ambulatory Visit
Admission: RE | Admit: 2018-01-26 | Discharge: 2018-01-26 | Disposition: A | Discharge status: Home / Self Care | Payer: Medicare Other | Source: Ambulatory Visit | Attending: Student in an Organized Health Care Education/Training Program | Admitting: Student in an Organized Health Care Education/Training Program

## 2018-01-26 VITALS — BP 140/65 | HR 66 | Temp 98.0°F | Resp 20 | Ht 72.0 in | Wt 225.0 lb

## 2018-01-26 DIAGNOSIS — Z96651 Presence of right artificial knee joint: Secondary | ICD-10-CM | POA: Insufficient documentation

## 2018-01-26 DIAGNOSIS — M170 Bilateral primary osteoarthritis of knee: Secondary | ICD-10-CM

## 2018-01-26 DIAGNOSIS — G894 Chronic pain syndrome: Secondary | ICD-10-CM

## 2018-01-26 MED ORDER — DEXAMETHASONE SODIUM PHOSPHATE 10 MG/ML IJ SOLN
10.0000 mg | Freq: Once | INTRAMUSCULAR | Status: AC
  Administered 2018-01-26: 10 mg
  Filled 2018-01-26: qty 1

## 2018-01-26 MED ORDER — LIDOCAINE HCL 2 % IJ SOLN
INTRAMUSCULAR | Status: AC
  Filled 2018-01-26: qty 20

## 2018-01-26 MED ORDER — LIDOCAINE HCL 2 % IJ SOLN
20.0000 mL | Freq: Once | INTRAMUSCULAR | Status: AC
  Administered 2018-01-26: 400 mg
  Filled 2018-01-26: qty 40

## 2018-01-26 MED ORDER — ROPIVACAINE HCL 2 MG/ML IJ SOLN
10.0000 mL | Freq: Once | INTRAMUSCULAR | Status: AC
  Administered 2018-01-26: 10 mL
  Filled 2018-01-26: qty 10

## 2018-01-26 NOTE — Progress Notes (Signed)
Safety precautions to be maintained throughout the outpatient stay will include: orient to surroundings, keep bed in low position, maintain call bell within reach at all times, provide assistance with transfer out of bed and ambulation.  

## 2018-01-26 NOTE — Patient Instructions (Signed)
____________________________________________________________________________________________  Post-Procedure Discharge Instructions  Instructions:  Apply ice: Fill a plastic sandwich bag with crushed ice. Cover it with a small towel and apply to injection site. Apply for 15 minutes then remove x 15 minutes. Repeat sequence on day of procedure, until you go to bed. The purpose is to minimize swelling and discomfort after procedure.  Apply heat: Apply heat to procedure site starting the day following the procedure. The purpose is to treat any soreness and discomfort from the procedure.  Food intake: Start with clear liquids (like water) and advance to regular food, as tolerated.   Physical activities: Keep activities to a minimum for the first 8 hours after the procedure.   Driving: If you have received any sedation, you are not allowed to drive for 24 hours after your procedure.  Blood thinner: Restart your blood thinner 6 hours after your procedure. (Only for those taking blood thinners)  Insulin: As soon as you can eat, you may resume your normal dosing schedule. (Only for those taking insulin)  Infection prevention: Keep procedure site clean and dry.  Post-procedure Pain Diary: Extremely important that this be done correctly and accurately. Recorded information will be used to determine the next step in treatment.  Pain evaluated is that of treated area only. Do not include pain from an untreated area.  Complete every hour, on the hour, for the initial 8 hours. Set an alarm to help you do this part accurately.  Do not go to sleep and have it completed later. It will not be accurate.  Follow-up appointment: Keep your follow-up appointment after the procedure. Usually 2 weeks for most procedures. (6 weeks in the case of radiofrequency.) Bring you pain diary.   Expect:  From numbing medicine (AKA: Local Anesthetics): Numbness or decrease in pain.  Onset: Full effect within 15  minutes of injected.  Duration: It will depend on the type of local anesthetic used. On the average, 1 to 8 hours.   From steroids: Decrease in swelling or inflammation. Once inflammation is improved, relief of the pain will follow.  Onset of benefits: Depends on the amount of swelling present. The more swelling, the longer it will take for the benefits to be seen. In some cases, up to 10 days.  Duration: Steroids will stay in the system x 2 weeks. Duration of benefits will depend on multiple posibilities including persistent irritating factors.  From procedure: Some discomfort is to be expected once the numbing medicine wears off. This should be minimal if ice and heat are applied as instructed.  Call if:  You experience numbness and weakness that gets worse with time, as opposed to wearing off.  New onset bowel or bladder incontinence. (This applies to Spinal procedures only)  Emergency Numbers:  Durning business hours (Monday - Thursday, 8:00 AM - 4:00 PM) (Friday, 9:00 AM - 12:00 Noon): [phone]  After hours: [phone] ____________________________________________________________________________________________    Knee Injection A knee injection is a procedure to get medicine into your knee joint. Your health care provider puts a needle into the joint and injects medicine with an attached syringe. The injected medicine may relieve the pain, swelling, and stiffness of arthritis. The injected medicine may also help to lubricate and cushion your knee joint. You may need more than one injection. Tell a health care provider about:  Any allergies you have.  All medicines you are taking, including vitamins, herbs, eye drops, creams, and over-the-counter medicines.  Any problems you or family members have   had with anesthetic medicines.  Any blood disorders you have.  Any surgeries you have had.  Any medical conditions you have. What are the risks? Generally, this is a  safe procedure. However, problems may occur, including:  Infection.  Bleeding.  Worsening symptoms.  Damage to the area around your knee.  Allergic reaction to any of the medicines.  Skin reactions from repeated injections.  What happens before the procedure?  Ask your health care provider about changing or stopping your regular medicines. This is especially important if you are taking diabetes medicines or blood thinners.  Plan to have someone take you home after the procedure. What happens during the procedure?  You will sit or lie down in a position for your knee to be treated.  The skin over your kneecap will be cleaned with a germ-killing solution (antiseptic).  You will be given a medicine that numbs the area (local anesthetic). You may feel some stinging.  After your knee becomes numb, you will have a second injection. This is the medicine. This needle is carefully placed between your kneecap and your knee. The medicine is injected into the joint space.  At the end of the procedure, the needle will be removed.  A bandage (dressing) may be placed over the injection site. The procedure may vary among health care providers and hospitals. What happens after the procedure?  You may have to move your knee through its full range of motion. This helps to get all of the medicine into your joint space.  Your blood pressure, heart rate, breathing rate, and blood oxygen level will be monitored often until the medicines you were given have worn off.  You will be watched to make sure that you do not have a reaction to the injected medicine. This information is not intended to replace advice given to you by your health care provider. Make sure you discuss any questions you have with your health care provider. Document Released: 09/01/2006 Document Revised: 11/10/2015 Document Reviewed: 04/20/2014 [employer] Interactive Patient Education  2018 [employer]  

## 2018-01-26 NOTE — Progress Notes (Signed)
Patient's Name: Andrew Obrien  MRN: [id number]  Referring Provider: Dion Body, MD  DOB: 12-16-1934  PCP: Dion Body, MD  DOS: 01/26/2018  Note by: Gillis Santa, MD  Service setting: Ambulatory outpatient  Specialty: Interventional Pain Management  Patient type: Established  Location: [facility] (AMB) Pain Management Facility  Visit type: Interventional Procedure   Primary Reason for Visit: Interventional Pain Management Treatment. CC: Knee Pain (bilaterally)  Procedure:          Anesthesia, Analgesia, Anxiolysis:  Type: Genicular Nerves Block (Superior-lateral, Superior-medial, and Inferior-medial Genicular Nerves)          CPT: 19417      Primary Purpose: Diagnostic Region: Lateral, Anterior, and Medial aspects of the knee joint, above and below the knee joint proper. Level: Superior and inferior to the knee joint. Target Area: For Genicular Nerve block(s), the targets are: the superior-lateral genicular nerve, located in the lateral distal portion of the femoral shaft as it curves to form the lateral epicondyle, in the region of the distal femoral metaphysis; the superior-medial genicular nerve, located in the medial distal portion of the femoral shaft as it curves to form the medial epicondyle; and the inferior-medial genicular nerve, located in the medial, proximal portion of the tibial shaft, as it curves to form the medial epicondyle, in the region of the proximal tibial metaphysis. Approach: Anterior, percutaneous, ipsilateral approach. Laterality: Bilateral Position: Modified Fowler's position with pillows under the targeted knee(s).  Type: Moderate (Conscious) Sedation combined with Local Anesthesia Indication(s): Analgesia and Anxiety Route: Intravenous (IV) IV Access: Secured Sedation: Meaningful verbal contact was maintained at all times during the procedure  Local Anesthetic: Lidocaine 2%   Indications: 1. History of total right knee replacement (x2)   2. Bilateral  primary osteoarthritis of knee   3. Chronic pain syndrome    Pain Score: Pre-procedure: 7 /10 Post-procedure: 0-No pain/10  Pre-op Assessment:  [name] is a 83 y.o. (year old), male patient, seen today for interventional treatment. 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 Mr. Kavanagh has a current medication list which includes the following prescription(s): allopurinol, azelastine, azelastine-fluticasone, calcipotriene, calcium carb-cholecalciferol, calcium-vitamin d, cholecalciferol, diclofenac sodium, duloxetine, enoxaparin, fluocinolone, fluticasone, glucosamine-chondroit-vit c-mn, melatonin, metformin, mirabegron er, multivitamin with minerals, NON FORMULARY, olmesartan-hydrochlorothiazide, omeprazole, potassium, pregabalin, ropinirole, rosuvastatin, tamsulosin, temazepam, tramadol, tramadol, vitamin c, and oxycodone. His primarily concern today is the Knee Pain (bilaterally)  Initial Vital Signs:  Pulse/HCG Rate: 62  Temp: 98 F (36.7 C) Resp: 16 BP: 134/63 SpO2: 96 %  BMI: Estimated body mass index is 30.52 kg/m as calculated from the following:   Height as of this encounter: 6' (1.829 m).   Weight as of this encounter: 225 lb (102.1 kg).  Risk Assessment: Allergies: Reviewed. He has No Known Allergies.  Allergy Precautions: None required Coagulopathies: Reviewed. None identified.  Blood-thinner therapy: None at this time Active Infection(s): Reviewed. None identified. [name] is afebrile  Site Confirmation: Mr. Diop was asked to confirm the procedure and laterality before marking the site Procedure checklist: Completed Consent: Before the procedure and under  the influence of no sedative(s), amnesic(s), or anxiolytics, the patient was informed of the treatment options, risks and possible complications. To fulfill our ethical and legal obligations, as recommended by the [employer]'s Code of Ethics, I have informed the patient of my clinical impression; the nature and purpose of the treatment or procedure; the risks, benefits, and possible complications of the intervention; the alternatives, including doing nothing; the risk(s) and benefit(s) of the alternative treatment(s) or procedure(s); and the risk(s) and benefit(s) of doing nothing. The patient was provided information about the general risks and possible complications associated with the procedure. These may include, but are not limited to: failure to achieve desired goals, infection, bleeding, organ or nerve damage, allergic reactions, paralysis, and death. In addition, the patient was informed of those risks and complications associated to the procedure, such as failure to decrease pain; infection; bleeding; organ or nerve damage with subsequent damage to sensory, motor, and/or autonomic systems, resulting in permanent pain, numbness, and/or weakness of one or several areas of the body; allergic reactions; (i.e.: anaphylactic reaction); and/or death. Furthermore, the patient was informed of those risks and complications associated with the medications. These include, but are not limited to: allergic reactions (i.e.: anaphylactic or anaphylactoid reaction(s)); adrenal axis suppression; blood sugar elevation that in diabetics may result in ketoacidosis or comma; water retention that in patients with history of congestive heart failure may result in shortness of breath, pulmonary edema, and decompensation with resultant heart failure; weight gain; swelling or edema; medication-induced neural toxicity; particulate matter embolism and blood vessel occlusion with resultant organ, and/or nervous  system infarction; and/or aseptic necrosis of one or more joints. Finally, the patient was informed that Medicine is not an exact science; therefore, there is also the possibility of unforeseen or unpredictable risks and/or possible complications that may result in a catastrophic outcome. The patient indicated having understood very clearly. We have given the patient no guarantees and we have made no promises. Enough time was given to the patient to ask questions, all of which were answered to the patient's satisfaction. Mr. Tones has indicated that he wanted to continue with the procedure. Attestation: I, the ordering provider, attest that I have discussed with the patient the benefits, risks, side-effects, alternatives, likelihood of achieving goals, and potential problems during recovery for the procedure that I have provided informed consent. Date  Time: 01/26/2018  8:20 AM  Pre-Procedure Preparation:  Monitoring: As per clinic protocol. Respiration, ETCO2, SpO2, BP, heart rate and rhythm monitor placed and checked for adequate function Safety Precautions: Patient was assessed for positional comfort and pressure points before starting the procedure. Time-out: I initiated and conducted the "Time-out" before starting the procedure, as per protocol. The patient was asked to participate by confirming the accuracy of the "Time Out" information. Verification of the correct person, site, and procedure were performed and confirmed by me, the nursing staff, and the patient. "Time-out" conducted as per Joint Commission's Universal Protocol (UP.01.01.01). Time: 0920  Description of Procedure:          Area Prepped: Entire knee area, from mid-thigh to mid-shin, lateral, anterior, and medial aspects. Prepping solution: ChloraPrep (2% chlorhexidine gluconate and 70% isopropyl alcohol) Safety Precautions: Aspiration looking for blood return was conducted prior to all injections. At no point did we inject any  substances, as a needle was being advanced. No attempts were made at seeking any paresthesias. Safe injection practices and needle disposal techniques used. Medications properly checked for expiration dates. SDV (single dose vial) medications used. Latex Allergy precautions taken.   Description of the Procedure: Protocol guidelines were followed. The patient was placed in position over the procedure table. The target area was identified and  the area prepped in the usual manner. Skin & deeper tissues infiltrated with local anesthetic. Appropriate amount of time allowed to pass for local anesthetics to take effect. The procedure needles were then advanced to the target area. Proper needle placement secured. Negative aspiration confirmed. Solution injected in intermittent fashion, asking for systemic symptoms every 0.5cc of injectate. The needles were then removed and the area cleansed, making sure to leave some of the prepping solution back to take advantage of its long term bactericidal properties.  Vitals:   01/26/18 0915 01/26/18 0925 01/26/18 0930 01/26/18 0937  BP: 135/77 (!) 142/70 (!) 142/63 140/65  Pulse: 66 65 67 66  Resp: 14 18 19 20   Temp:      TempSrc:      SpO2: 97% 98% 96% 97%  Weight:      Height:        Start Time: 0920 hrs. End Time: 0933 hrs. Materials:  Needle(s) Type: Regular needle Gauge: 25G Length: 3.5-in Medication(s): Please see orders for medications and dosing details. 5 cc solution made of 4 cc of 0.2% ropivacaine, 1 cc of Decadron 10 mg/cc.  1.5 cc injected at each level on the right.  Another 5 cc solution made of 4 cc of 0.2% ropivacaine, 1 cc of Decadron, 10 mg/cc.  1.5 cc injected at each level on the left.  Total steroid dose used for both knees equals 20 mg of Decadron. Imaging Guidance (Non-Spinal):          Type of Imaging Technique: Fluoroscopy Guidance (Non-Spinal) Indication(s): Assistance in needle guidance and placement for procedures requiring needle  placement in or near specific anatomical locations not easily accessible without such assistance. Exposure Time: Please see nurses notes. Contrast: Before injecting any contrast, we confirmed that the patient did not have an allergy to iodine, shellfish, or radiological contrast. Once satisfactory needle placement was completed at the desired level, radiological contrast was injected. Contrast injected under live fluoroscopy. No contrast complications. See chart for type and volume of contrast used. Fluoroscopic Guidance: I was personally present during the use of fluoroscopy. "Tunnel Vision Technique" used to obtain the best possible view of the target area. Parallax error corrected before commencing the procedure. "Direction-depth-direction" technique used to introduce the needle under continuous pulsed fluoroscopy. Once target was reached, antero-posterior, oblique, and lateral fluoroscopic projection used confirm needle placement in all planes. Images permanently stored in EMR. Interpretation: I personally interpreted the imaging intraoperatively. Adequate needle placement confirmed in multiple planes. Appropriate spread of contrast into desired area was observed. No evidence of afferent or efferent intravascular uptake. Permanent images saved into the patient's record.  Antibiotic Prophylaxis:   Anti-infectives (From admission, onward)   None     Indication(s): None identified  Post-operative Assessment:  Post-procedure Vital Signs:  Pulse/HCG Rate: 66  Temp: 98 F (36.7 C) Resp: 20 BP: 140/65 SpO2: 97 %  EBL: None  Complications: No immediate post-treatment complications observed by team, or reported by patient.  Note: The patient tolerated the entire procedure well. A repeat set of vitals were taken after the procedure and the patient was kept under observation following institutional policy, for this type of procedure. Post-procedural neurological assessment was performed, showing  return to baseline, prior to discharge. The patient was provided with post-procedure discharge instructions, including a section on how to identify potential problems. Should any problems arise concerning this procedure, the patient was given instructions to immediately contact us, at any time, without hesitation. In any case, we plan to  contact the patient by telephone for a follow-up status report regarding this interventional procedure.  Comments:  No additional relevant information.  Plan of Care    Imaging Orders     DG C-Arm 1-60 Min-No Report Procedure Orders    No procedure(s) ordered today    Medications ordered for procedure: Meds ordered this encounter  Medications  . lidocaine (XYLOCAINE) 2 % (with pres) injection 400 mg  . ropivacaine (PF) 2 mg/mL (0.2%) (NAROPIN) injection 10 mL  . dexamethasone (DECADRON) injection 10 mg  . dexamethasone (DECADRON) injection 10 mg   Medications administered: We administered lidocaine, ropivacaine (PF) 2 mg/mL (0.2%), dexamethasone, and dexamethasone.  See the medical record for exact dosing, route, and time of administration.  New Prescriptions   No medications on file   Disposition: Discharge home  Discharge Date & Time: 01/26/2018; 0939 hrs.   Physician-requested Follow-up: Return in about 3 weeks (around 02/16/2018) for Post Procedure Evaluation.  Future Appointments  Date Time Provider [place]  02/16/2018  1:15 PM Gillis Santa, MD [facility] None   Primary Care Physician: Dion Body, MD Location: [facility] Out[facility] Management Facility Note by: Gillis Santa, MD Date: 01/26/2018; Time: 11:20 AM  Disclaimer:  Medicine is not an exact science. The only guarantee in medicine is that nothing is guaranteed. It is important to note that the decision to proceed with this intervention was based on the information collected from the patient. The Data and conclusions were drawn from the patient's questionnaire, the  interview, and the physical examination. Because the information was provided in large part by the patient, it cannot be guaranteed that it has not been purposely or unconsciously manipulated. Every effort has been made to obtain as much relevant data as possible for this evaluation. It is important to note that the conclusions that lead to this procedure are derived in large part from the available data. Always take into account that the treatment will also be dependent on availability of resources and existing treatment guidelines, considered by other Pain Management Practitioners as being common knowledge and practice, at the time of the intervention. For Medico-Legal purposes, it is also important to point out that variation in procedural techniques and pharmacological choices are the acceptable norm. The indications, contraindications, technique, and results of the above procedure should only be interpreted and judged by a Board-Certified Interventional Pain Specialist with extensive familiarity and expertise in the same exact procedure and technique.

## 2018-01-27 ENCOUNTER — Telehealth: Payer: Self-pay | Admitting: *Deleted

## 2018-01-27 NOTE — Telephone Encounter (Signed)
voicemai left to call our office if there are questions or concerns re; procedure on yesterday.

## 2018-01-27 NOTE — Telephone Encounter (Signed)
Spoke with patient re; procedure on yesterday.  Currently he has not pain and is doing well.  Will see him at his next appt on 02/16/18

## 2018-02-16 ENCOUNTER — Ambulatory Visit: Payer: Medicare Other | Admitting: Student in an Organized Health Care Education/Training Program

## 2018-02-16 ENCOUNTER — Ambulatory Visit
Payer: Medicare Other | Attending: Student in an Organized Health Care Education/Training Program | Admitting: Student in an Organized Health Care Education/Training Program

## 2018-02-16 ENCOUNTER — Encounter: Payer: Self-pay | Admitting: Student in an Organized Health Care Education/Training Program

## 2018-02-16 VITALS — BP 138/57 | HR 81 | Temp 97.9°F | Resp 16 | Ht 72.0 in | Wt 230.0 lb

## 2018-02-16 DIAGNOSIS — M170 Bilateral primary osteoarthritis of knee: Secondary | ICD-10-CM | POA: Diagnosis not present

## 2018-02-16 DIAGNOSIS — G894 Chronic pain syndrome: Secondary | ICD-10-CM | POA: Diagnosis not present

## 2018-02-16 DIAGNOSIS — Z79891 Long term (current) use of opiate analgesic: Secondary | ICD-10-CM | POA: Diagnosis not present

## 2018-02-16 DIAGNOSIS — E1140 Type 2 diabetes mellitus with diabetic neuropathy, unspecified: Secondary | ICD-10-CM | POA: Insufficient documentation

## 2018-02-16 DIAGNOSIS — F329 Major depressive disorder, single episode, unspecified: Secondary | ICD-10-CM | POA: Diagnosis not present

## 2018-02-16 DIAGNOSIS — Z96641 Presence of right artificial hip joint: Secondary | ICD-10-CM | POA: Insufficient documentation

## 2018-02-16 DIAGNOSIS — I10 Essential (primary) hypertension: Secondary | ICD-10-CM | POA: Diagnosis not present

## 2018-02-16 DIAGNOSIS — Z969 Presence of functional implant, unspecified: Secondary | ICD-10-CM | POA: Insufficient documentation

## 2018-02-16 DIAGNOSIS — M961 Postlaminectomy syndrome, not elsewhere classified: Secondary | ICD-10-CM | POA: Diagnosis not present

## 2018-02-16 DIAGNOSIS — Z9889 Other specified postprocedural states: Secondary | ICD-10-CM | POA: Diagnosis not present

## 2018-02-16 DIAGNOSIS — G4733 Obstructive sleep apnea (adult) (pediatric): Secondary | ICD-10-CM | POA: Insufficient documentation

## 2018-02-16 DIAGNOSIS — K219 Gastro-esophageal reflux disease without esophagitis: Secondary | ICD-10-CM | POA: Insufficient documentation

## 2018-02-16 DIAGNOSIS — Z96651 Presence of right artificial knee joint: Secondary | ICD-10-CM | POA: Insufficient documentation

## 2018-02-16 DIAGNOSIS — Z79899 Other long term (current) drug therapy: Secondary | ICD-10-CM | POA: Insufficient documentation

## 2018-02-16 DIAGNOSIS — Z7984 Long term (current) use of oral hypoglycemic drugs: Secondary | ICD-10-CM | POA: Insufficient documentation

## 2018-02-16 DIAGNOSIS — Z9689 Presence of other specified functional implants: Secondary | ICD-10-CM

## 2018-02-16 NOTE — Progress Notes (Signed)
Patient's Name: Andrew Obrien  MRN: [id number]  Referring Provider: Dion Body, MD  DOB: [birth year]  PCP: Dion Body, MD  DOS: 02/16/2018  Note by: Gillis Santa, MD  Service setting: Ambulatory outpatient  Specialty: Interventional Pain Management  Location: [facility] (AMB) Pain Management Facility    Patient type: Established   Primary Reason(s) for Visit: Encounter for post-procedure evaluation of chronic illness with mild to moderate exacerbation CC: Knee Pain (bilateral )  HPI  Andrew Obrien is a 83 y.o. year old, male patient, who comes today for a post-procedure evaluation. He has Status post total replacement of hip; History of total right knee replacement (x2); Failed back surgical syndrome; Bilateral primary osteoarthritis of knee; Neuropathic pain; Spinal cord stimulator status; and Chronic pain syndrome on their problem list. His primarily concern today is the Knee Pain (bilateral )  Pain Assessment: Location: Left, Right Knee Radiating: denies  Onset: More than a month ago Duration: Chronic pain Quality: Discomfort, Burning, Aching, Constant(stiff ) Severity: 8 (right knee is 8 and left 2 )/10 (subjective, self-reported pain score)  Note: Reported level is compatible with observation.                         When using our objective Pain Scale, levels between 6 and 10/10 are said to belong in an emergency room, as it progressively worsens from a 6/10, described as severely limiting, requiring emergency care not usually available at an outpatient pain management facility. At a 6/10 level, communication becomes difficult and requires great effort. Assistance to reach the emergency department may be required. Facial flushing and profuse sweating along with potentially dangerous increases in heart rate and blood pressure will be evident. Effect on ADL: patient reports difficulty getting out of bed.  trying to avoid using cane but feels off balance especially on the right.   Timing: Constant Modifying factors: procedures helped x 2 weeks then the pain relief went away  BP: (!) 138/57  HR: 81  Andrew Obrien comes in today for post-procedure evaluation after the treatment done on 01/27/2018.  Further details on both, my assessment(s), as well as the proposed treatment plan, please see below.  Post-Procedure Assessment  01/26/2018 Procedure: Bilateral genicular nerve block Pre-procedure pain score:  7/10 Post-procedure pain score: 0/10         Influential Factors: BMI: 31.19 kg/m Intra-procedural challenges: None observed.         Assessment challenges: None detected.              Reported side-effects: Steroid-induced Increased in blood sugars        Post-procedural adverse reactions or complications: None reported         Sedation: Please see nurses note. When no sedatives are used, the analgesic levels obtained are directly associated to the effectiveness of the local anesthetics. However, when sedation is provided, the level of analgesia obtained during the initial 1 hour following the intervention, is believed to be the result of a combination of factors. These factors may include, but are not limited to: 1. The effectiveness of the local anesthetics used. 2. The effects of the analgesic(s) and/or anxiolytic(s) used. 3. The degree of discomfort experienced by the patient at the time of the procedure. 4. The patients ability and reliability in recalling and recording the events. 5. The presence and influence of possible secondary gains and/or psychosocial factors. Reported result: Relief experienced during the 1st hour after the procedure: 100 % (Ultra-Short Term Relief)  Interpretative annotation: Clinically appropriate result. Analgesia during this period is likely to be Local Anesthetic and/or IV Sedative (Analgesic/Anxiolytic) related.          Effects of local anesthetic: The analgesic effects attained during this period are directly associated  to the localized infiltration of local anesthetics and therefore cary significant diagnostic value as to the etiological location, or anatomical origin, of the pain. Expected duration of relief is directly dependent on the pharmacodynamics of the local anesthetic used. Long-acting (4-6 hours) anesthetics used.  Reported result: Relief during the next 4 to 6 hour after the procedure: 100 % (Short-Term Relief)            Interpretative annotation: Clinically appropriate result. Analgesia during this period is likely to be Local Anesthetic-related.          Long-term benefit: Defined as the period of time past the expected duration of local anesthetics (1 hour for short-acting and 4-6 hours for long-acting). With the possible exception of prolonged sympathetic blockade from the local anesthetics, benefits during this period are typically attributed to, or associated with, other factors such as analgesic sensory neuropraxia, antiinflammatory effects, or beneficial biochemical changes provided by agents other than the local anesthetics.  Reported result: Extended relief following procedure: 100 %(pain relief lasted to approx 1 week ago. ) (Long-Term Relief)            Interpretative annotation: Clinically possible results. Good relief. No permanent benefit expected. Inflammation plays a part in the etiology to the pain.          Current benefits: Defined as reported results that persistent at this point in time.   Analgesia: 25-50 %            Function: Somewhat improved ROM: Somewhat improved Interpretative annotation: Recurrence of symptoms. No permanent benefit expected. Effective diagnostic intervention.          Interpretation: Results would suggest a successful diagnostic intervention.                  Plan:  Repeat treatment or therapy and compare extent and duration of benefits.                Laboratory Chemistry  Inflammation Markers (CRP: Acute Phase) (ESR: Chronic Phase) Lab Results   Component Value Date   CRP 1.1 (H) 04/02/2017   ESRSEDRATE 25 (H) 04/02/2017                         Rheumatology Markers No results found for: RF, ANA, LABURIC, URICUR, LYMEIGGIGMAB, LYMEABIGMQN, HLAB27                      Renal Function Markers Lab Results  Component Value Date   BUN 21 (H) 04/16/2017   CREATININE 1.15 04/16/2017   GFRAA >60 04/16/2017   GFRNONAA 58 (L) 04/16/2017                             Hepatic Function Markers Lab Results  Component Value Date   AST 21 04/02/2017   ALT 22 04/02/2017   ALBUMIN 4.0 04/02/2017   ALKPHOS 81 04/02/2017                        Electrolytes Lab Results  Component Value Date   NA 141 04/16/2017   K 4.2 04/16/2017   CL 104 04/16/2017  CALCIUM 8.9 04/16/2017                        Neuropathy Markers Lab Results  Component Value Date   HGBA1C 8.0 (H) 04/02/2017                        Bone Pathology Markers No results found for: Marveen Reeks, [id number], IR4431VQ0, 25OHVITD1, 25OHVITD2, 25OHVITD3, TESTOFREE, TESTOSTERONE                       Coagulation Parameters Lab Results  Component Value Date   INR 0.96 04/02/2017   LABPROT 12.7 04/02/2017   APTT 33 04/02/2017   PLT 234 04/16/2017                        Cardiovascular Markers Lab Results  Component Value Date   HGB 12.6 (L) 04/16/2017   HCT 37.8 (L) 04/16/2017                         CA Markers No results found for: CEA, CA125, LABCA2                      Note: Lab results reviewed.  Recent Diagnostic Imaging Results  DG C-Arm 1-60 Min-No Report Fluoroscopy was utilized by the requesting physician.  No radiographic  interpretation.   Complexity Note: Imaging results reviewed. Results shared with Mr. Towson, using Layman's terms.                         Meds   Current Outpatient Medications:  .  acetaminophen (TYLENOL) 500 MG tablet, Take 1-2 tablets by mouth as needed., Disp: , Rfl:  .  allopurinol (ZYLOPRIM) 300 MG tablet,  Take 300 mg by mouth daily., Disp: , Rfl:  .  azelastine (ASTELIN) 0.1 % nasal spray, Place 1 spray into both nostrils 2 (two) times daily. Use in each nostril as directed, Disp: , Rfl:  .  Azelastine-Fluticasone (DYMISTA) 137-50 MCG/ACT SUSP, Place 1 spray into the nose at bedtime., Disp: , Rfl:  .  calcipotriene (DOVONOX) 0.005 % cream, Apply 1 application topically 2 (two) times daily., Disp: , Rfl:  .  Calcium Carb-Cholecalciferol (CALCIUM 600+D) 600-800 MG-UNIT TABS, Take 1 capsule by mouth 2 (two) times daily., Disp: , Rfl:  .  calcium-vitamin D (OSCAL WITH D) 500-200 MG-UNIT tablet, Take 1 tablet by mouth 2 (two) times daily., Disp: , Rfl:  .  cholecalciferol (VITAMIN D) 1000 units tablet, Take 1,000 Units by mouth 2 (two) times daily., Disp: , Rfl:  .  diclofenac sodium (VOLTAREN) 1 % GEL, Apply 4 g topically 4 (four) times daily., Disp: 100 g, Rfl: 2 .  DULoxetine (CYMBALTA) 30 MG capsule, Take 30 mg by mouth 2 (two) times daily., Disp: , Rfl:  .  enoxaparin (LOVENOX) 40 MG/0.4ML injection, Inject 0.4 mLs (40 mg total) into the skin daily., Disp: 14 Syringe, Rfl: 0 .  fluocinolone (SYNALAR) 0.01 % external solution, Apply 1 drop topically 2 (two) times daily as needed (uses in ears)., Disp: , Rfl:  .  fluticasone (FLONASE) 50 MCG/ACT nasal spray, Place 2 sprays into both nostrils at bedtime., Disp: , Rfl:  .  glimepiride (AMARYL) 2 MG tablet, Take 2 mg by mouth daily., Disp: , Rfl:  .  Glucosamine-Chondroit-Vit C-Mn (GLUCOSAMINE CHONDR [place]  PO), Take 1 tablet by mouth 2 (two) times daily., Disp: , Rfl:  .  Melatonin 5 MG TABS, Take 1 tablet by mouth at bedtime., Disp: , Rfl:  .  metFORMIN (GLUCOPHAGE) 1000 MG tablet, Take 1,000 mg by mouth 2 (two) times daily with a meal., Disp: , Rfl:  .  mirabegron ER (MYRBETRIQ) 50 MG TB24 tablet, Take 50 mg by mouth daily., Disp: , Rfl:  .  Multiple Vitamins-Minerals (MULTIVITAMIN WITH MINERALS) tablet, Take 1 tablet by mouth daily., Disp: , Rfl:   .  NON FORMULARY, Take 1 tablet by mouth daily. [employer], Disp: , Rfl:  .  olmesartan-hydrochlorothiazide (BENICAR HCT) 40-12.5 MG tablet, Take 1 tablet by mouth daily., Disp: , Rfl:  .  omeprazole (PRILOSEC) 20 MG capsule, Take 20 mg by mouth every morning., Disp: , Rfl:  .  Potassium 99 MG TABS, Take 1 tablet by mouth daily., Disp: , Rfl:  .  pregabalin (LYRICA) 100 MG capsule, Take 100 mg by mouth 2 (two) times daily., Disp: , Rfl:  .  rOPINIRole (REQUIP) 1 MG tablet, Take 1 mg by mouth at bedtime., Disp: , Rfl:  .  rosuvastatin (CRESTOR) 10 MG tablet, Take 10 mg by mouth every evening., Disp: , Rfl:  .  tamsulosin (FLOMAX) 0.4 MG CAPS capsule, Take 0.4 mg by mouth daily., Disp: , Rfl:  .  temazepam (RESTORIL) 7.5 MG capsule, Take 7.5 mg by mouth at bedtime., Disp: , Rfl:  .  traMADol (ULTRAM) 50 MG tablet, Take 1-2 tablets (50-100 mg total) by mouth every 4 (four) hours as needed for moderate pain., Disp: 60 tablet, Rfl: 0 .  traMADol (ULTRAM) 50 MG tablet, Take 1-2 tablets (50-100 mg total) by mouth every 4 (four) hours as needed (mild pain)., Disp: 30 tablet, Rfl: 0 .  traZODone (DESYREL) 50 MG tablet, Take 50 mg by mouth daily., Disp: , Rfl:  .  vitamin C (ASCORBIC ACID) 500 MG tablet, Take 500 mg by mouth daily., Disp: , Rfl:  .  allopurinol (ZYLOPRIM) 300 MG tablet, Take 300 mg by mouth as needed., Disp: , Rfl:  .  Azelastine-Fluticasone (DYMISTA) 137-50 MCG/ACT SUSP, Inhale 1 spray into the lungs at bedtime., Disp: , Rfl:  .  oxyCODONE (OXY IR/ROXICODONE) 5 MG immediate release tablet, Take 1 tablet (5 mg total) by mouth every 3 (three) hours as needed for moderate pain ((score 4 to 6)). (Patient not taking: Reported on 01/06/2018), Disp: 60 tablet, Rfl: 0 .  rOPINIRole (REQUIP) 1 MG tablet, Take 1 mg by mouth daily., Disp: , Rfl:   ROS  Constitutional: Denies any fever or chills Gastrointestinal: No reported hemesis, hematochezia, vomiting, or acute GI distress Musculoskeletal:  Denies any acute onset joint swelling, redness, loss of ROM, or weakness Neurological: No reported episodes of acute onset apraxia, aphasia, dysarthria, agnosia, amnesia, paralysis, loss of coordination, or loss of consciousness  Allergies  Mr. Mcqueary has No Known Allergies.  [place]  Drug: Mr. Smethers  reports that he does not use drugs. Alcohol:  reports that he does not drink alcohol. Tobacco:  reports that he has never smoked. He has never used smokeless tobacco. Medical:  has a past medical history of Arthritis, Depression, Diabetes mellitus without complication ([place]), Difficult intubation, GERD (gastroesophageal reflux disease), Heart murmur, Hypertension, Macular degeneration, Neuropathy, OSA on CPAP, and Prostate cancer ([place]). Surgical: Mr. Dettmann  has a past surgical history that includes Pain pump implantation; Cataract extraction w/ intraocular lens  implant, bilateral; Total shoulder replacement (Left); Rotator  cuff repair (Left); Lumbar spine surgery; Mouth surgery; prostate cancer; Skin cancer excision; Replacement total knee (Right); and Total hip arthroplasty (Right, 04/14/2017). Family: family history is not on file.  Constitutional Exam  General appearance: Well nourished, well developed, and well hydrated. In no apparent acute distress Vitals:   02/16/18 1310  BP: (!) 138/57  Pulse: 81  Resp: 16  Temp: 97.9 F (36.6 C)  TempSrc: Oral  SpO2: 97%  Weight: 230 lb (104.3 kg)  Height: 6' (1.829 m)   BMI Assessment: Estimated body mass index is 31.19 kg/m as calculated from the following:   Height as of this encounter: 6' (1.829 m).   Weight as of this encounter: 230 lb (104.3 kg).  BMI interpretation table: BMI level Category Range association with higher incidence of chronic pain  <18 kg/m2 Underweight   18.5-24.9 kg/m2 Ideal body weight   25-29.9 kg/m2 Overweight Increased incidence by 20%  30-34.9 kg/m2 Obese (Class I) Increased incidence by 68%  35-39.9  kg/m2 Severe obesity (Class II) Increased incidence by 136%  >40 kg/m2 Extreme obesity (Class III) Increased incidence by 254%   Patient's current BMI Ideal Body weight  Body mass index is 31.19 kg/m. Ideal body weight: 77.6 kg (171 lb 1.2 oz) Adjusted ideal body weight: 88.3 kg (194 lb 10.3 oz)   BMI Readings from Last 4 Encounters:  02/16/18 31.19 kg/m  01/26/18 30.52 kg/m  01/06/18 31.19 kg/m  04/14/17 29.84 kg/m   Wt Readings from Last 4 Encounters:  02/16/18 230 lb (104.3 kg)  01/26/18 225 lb (102.1 kg)  01/06/18 230 lb (104.3 kg)  04/14/17 220 lb (99.8 kg)  Psych/Mental status: Alert, oriented x 3 (person, place, & time)       Eyes: PERLA Respiratory: No evidence of acute respiratory distress  Cervical Spine Area Exam  Skin & Axial Inspection: No masses, redness, edema, swelling, or associated skin lesions Alignment: Symmetrical Functional ROM: Unrestricted ROM      Stability: No instability detected Muscle Tone/Strength: Functionally intact. No obvious neuro-muscular anomalies detected. Sensory (Neurological): Unimpaired Palpation: No palpable anomalies              Upper Extremity (UE) Exam    Side: Right upper extremity  Side: Left upper extremity  Skin & Extremity Inspection: Skin color, temperature, and hair growth are WNL. No peripheral edema or cyanosis. No masses, redness, swelling, asymmetry, or associated skin lesions. No contractures.  Skin & Extremity Inspection: Skin color, temperature, and hair growth are WNL. No peripheral edema or cyanosis. No masses, redness, swelling, asymmetry, or associated skin lesions. No contractures.  Functional ROM: Unrestricted ROM          Functional ROM: Unrestricted ROM          Muscle Tone/Strength: Functionally intact. No obvious neuro-muscular anomalies detected.  Muscle Tone/Strength: Functionally intact. No obvious neuro-muscular anomalies detected.  Sensory (Neurological): Unimpaired          Sensory (Neurological):  Unimpaired          Palpation: No palpable anomalies              Palpation: No palpable anomalies              Provocative Test(s):  Phalen's test: deferred Tinel's test: deferred Apley's scratch test (touch opposite shoulder):  Action 1 (Across chest): deferred Action 2 (Overhead): deferred Action 3 (LB reach): deferred   Provocative Test(s):  Phalen's test: deferred Tinel's test: deferred Apley's scratch test (touch opposite shoulder):  Action 1 (  Across chest): deferred Action 2 (Overhead): deferred Action 3 (LB reach): deferred    Thoracic Spine Area Exam  Skin & Axial Inspection: No masses, redness, or swelling Alignment: Symmetrical Functional ROM: Unrestricted ROM Stability: No instability detected Muscle Tone/Strength: Functionally intact. No obvious neuro-muscular anomalies detected. Sensory (Neurological): Unimpaired Muscle strength & Tone: No palpable anomalies Lumbar Spine Area Exam  Skin & Axial Inspection: Well healed scar from previous spine surgery detected, spinal cord stimulator IPG palpated midline Alignment: Symmetrical Functional ROM: Decreased ROM       Stability: No instability detected Muscle Tone/Strength: Functionally intact. No obvious neuro-muscular anomalies detected. Sensory (Neurological): Dermatomal pain pattern and musculoskeletal Palpation: No palpable anomalies       Provocative Tests: Lumbar Hyperextension/rotation test: (+) bilaterally for facet joint pain. Lumbar quadrant test (Kemp's test): deferred today       Lumbar Lateral bending test: deferred today       Patrick's Maneuver: deferred today                   FABER test: deferred today                   Thigh-thrust test: deferred today       S-I compression test: deferred today       S-I distraction test: deferred today        Gait & Posture Assessment  Ambulation: Patient ambulates using a cane Gait: Limited. Using assistive device to ambulate Posture: Difficulty standing up  straight, due to pain   Lower Extremity Exam    Side: Right lower extremity  Side: Left lower extremity  Stability: Unstable          Stability: Unstable          Skin & Extremity Inspection: Evidence of prior arthroplastic surgery  Skin & Extremity Inspection: Skin color, temperature, and hair growth are WNL. No peripheral edema or cyanosis. No masses, redness, swelling, asymmetry, or associated skin lesions. No contractures.  Functional ROM: Decreased ROM for knee joint          Functional ROM: Decreased ROM for knee joint          Muscle Tone/Strength: Functionally intact. No obvious neuro-muscular anomalies detected.  Muscle Tone/Strength: Functionally intact. No obvious neuro-muscular anomalies detected.  Sensory (Neurological): Arthropathic arthralgia  Sensory (Neurological): Arthropathic arthralgia  Palpation: No palpable anomalies  Palpation: No palpable anomalies    Assessment  Primary Diagnosis & Pertinent Problem List: The primary encounter diagnosis was History of total right knee replacement (x2). Diagnoses of Bilateral primary osteoarthritis of knee, Chronic pain syndrome, Failed back surgical syndrome, and Spinal cord stimulator status were also pertinent to this visit.  Status Diagnosis  Persistent Improving Improving 1. History of total right knee replacement (x2)   2. Bilateral primary osteoarthritis of knee   3. Chronic pain syndrome   4. Failed back surgical syndrome   5. Spinal cord stimulator status      General Recommendations: The pain condition that the patient suffers from is best treated with a multidisciplinary approach that involves an increase in physical activity to prevent de-conditioning and worsening of the pain cycle, as well as psychological counseling (formal and/or informal) to address the co-morbid psychological affects of pain. Treatment will often involve judicious use of pain medications and interventional procedures to decrease the  pain, allowing the patient to participate in the physical activity that will ultimately produce long-lasting pain reductions. The goal of the multidisciplinary approach is to return  the patient to a higher level of overall function and to restore their ability to perform activities of daily living.  83 year old male with a history of bilateral knee osteoarthritis status post 2 right knee replacement surgeries 1 including a revision, axial low back and buttock pain secondary to failed back surgical syndrome with spinal cord stimulator in place [facility]) along with bilateral shoulder osteoarthritis (history of rotator cuff surgeries) who presents today with a primary complaint of right greater than left knee pain.   Patient returns today for follow-up status post bilateral genicular nerve block #1 performed on 01/26/2018 which resulted in approximately 100% pain relief until 1 week ago.  Patient is now endorsing return of his knee pain approaching levels prior to the block.  Patient states that for the first 2 weeks after his block, he had decreased pain and also improved functional capacity and ability to stand on his feet.  We discussed repeating genicular nerve block #2 followed possibly by radiofrequency ablation.  Risks and benefits were discussed and patient would like to proceed.  Plan of Care  Lab-work, procedure(s), and/or referral(s): Orders Placed This Encounter  Procedures  . GENICULAR NERVE BLOCK  -Repeat Genicular NB #2 (bilaterally)  Time Note: Greater than 50% of the 25 minute(s) of face-to-face time spent with Mr. Granada, was spent in counseling/coordination of care regarding: the results of his recent test(s), the treatment plan, treatment alternatives, the risks and possible complications of proposed treatment, the results, interpretation and significance of  his recent diagnostic interventional treatment(s) and realistic expectations.  Future Appointments  Date Time  Provider [place]  03/09/2018 10:00 AM Gillis Santa, MD [facility] None    Primary Care Physician: Dion Body, MD Location: [facility] Outpatient Pain Management Facility Note by: Gillis Santa, M.D Date: 02/16/2018; Time: 3:10 PM  Patient Instructions  ____________________________________________________________________________________________  Genicular Nerve Block  What is a genicular nerve block? A genicular nerve block is the injection of a local anesthetic to block the nerves that transmits pain from the knee.  What is the purpose of a facet nerve block? A genicular nerve block is a diagnostic procedure to determine if the pathologic changes (i.e. arthritis, meniscal tears, etc) and inflammation within the knee joint is the source of your knee pain. It also confirms that the knee pain will respond well to the actual treatment procedure. If a genicular nerve block works, it will give you relief for several hours. After that, the pain is expected to return to normal. This test is always performed twice (usually a week or two apart) because two successful tests are required to move onto treatment. If both diagnostic tests are positive, then we schedule a treatment called radiofrequency (RF) ablation. In this procedure, the same nerves are cauterized, which typically leads to pain relief for 4 -18 months. If this process works well for one knee, it can be performed on the other knee if needed.  How is the procedure performed? You will be placed on the procedure table. The injection site is sterilized with either iodine or chlorhexadine. The site to be injected is numbed with a local anesthetic, and a needle is directed to the target area. X-ray guidance is used to ensure proper placement and positioning of the needle. When the needle is properly positioned near the genicular nerve, local anesthetic is injected to numb that nerve. This will be repeated at multiple sites around the knee  to block all genicular nerves.  Will the procedure be painful? The injection  can be painful and we therefore provide the option of receiving IV sedation. IV sedation, combined with local anesthetic, can make the injection nearly pain free. It allows you to remain very still during the procedure, which can also make the injection easier, faster, and more successful. If you decide to have IV sedation, you must have a driver to get you home safely afterwards. In addition, you cannot have anything to eat or drink within 8 hours of your appointment (clear liquids are allowed until 3 hours before the procedure). If you take medications for diabetes, these medications may need to be adjusted the morning of the procedure. Your primary care physician can help you with this adjustment.  What are the discharge instructions? If you received IV sedation do not drive or operate machinery for at least 24 hours after the procedure. You may return to work the next day following your procedure. You may resume your normal diet immediately. Do not engage in any strenuous activity for 24 hours. You should, however, engage in moderate activity that typically causes your ususal pain. If the block works, those activities should not be painful for several hours after the injection. Do not take a bath, swim, or use a hot tub for 24 hours (you may take a shower). Call the office if you have any of the following: severe pain afterwards (different than your usual symptoms), redness/swelling/discharge at the injection site(s), fevers/chills, difficulty with bowel or bladder functions.  What are the risks and side effects? The complication rate for this procedure is very low. Whenever a needle enters the skin, bleeding or infection can occur. Some other serious but extremely rare risks include paralysis and death. You may have an allergic reaction to any of the medications used. If you have a known allergy to any medications, especially  local anesthetics, notify our staff before the procedure takes place. You may experience any of the following side effects up to 4 - 6 hours after the procedure: . Leg muscle weakness or numbness may occur due to the local anesthetic affecting the nerves that control your legs (this is a temporary affect and it is not paralysis). If you have any leg weakness or numbness, walk only with assistance in order to prevent falls and injury. Your leg strength will return slowly and completely. . Dizziness may occur due to a decrease in your blood pressure. If this occurs, remain in a seated or lying position. Gradually sit up, and then stand after at least 10 minutes of sitting. . Mild headaches may occur. Drink fluids and take pain medications if needed. If the headaches persist or become severe, call the office. . Mild discomfort at the injection site can occur. This typically lasts for a few hours but can persist for a couple days. If this occurs, take anti-inflammatories or pain medications, apply ice to the area the day of the procedure. If it persists, apply moist heat in the day(s) following.  The side effects listed above can be normal. They are not dangerous and will resolve on their own. If, however, you experience any of the following, a complication may have occurred and you should either contact your doctor. If he is not readily available, then you should proceed to the closest urgent care center for evaluation: . Severe or progressive pain at the injection site(s) . Arm or leg weakness that progressively worsens or persists for longer than 8 hours . Severe or progressive redness, swelling, or discharge from the injections site(s) .  Fevers, chills, nausea, or vomiting . Bowel or bladder dysfunction (i.e. inability to urinate or pass stool or difficulty controlling either)  How long does it take for the procedure to work? You should feel relief from your usual pain within the first hour. Again,  this is only expected to last for several hours, at the most. Remember, you may be sore in the middle part of your back from the needles, and you must distinguish this from your usual pain. ____________________________________________________________________________________________  ____________________________________________________________________________________________  Preparing for your procedure (without sedation)  Instructions: . Oral Intake: Do not eat or drink anything for at least 3 hours prior to your procedure. . Transportation: Unless otherwise stated by your physician, you may drive yourself after the procedure. . Blood Pressure Medicine: Take your blood pressure medicine with a sip of water the morning of the procedure. . Blood thinners: Notify our staff if you are taking any blood thinners. Depending on which one you take, there will be specific instructions on how and when to stop it. . Diabetics on insulin: Notify the staff so that you can be scheduled 1st case in the morning. If your diabetes requires high dose insulin, take only  of your normal insulin dose the morning of the procedure and notify the staff that you have done so. . Preventing infections: Shower with an antibacterial soap the morning of your procedure.  . Build-up your immune system: Take 1000 mg of Vitamin C with every meal (3 times a day) the day prior to your procedure. Marland Kitchen Antibiotics: Inform the staff if you have a condition or reason that requires you to take antibiotics before dental procedures. . Pregnancy: If you are pregnant, call and cancel the procedure. . Sickness: If you have a cold, fever, or any active infections, call and cancel the procedure. . Arrival: You must be in the facility at least 30 minutes prior to your scheduled procedure. . Children: Do not bring any children with you. . Dress appropriately: Bring dark clothing that you would not mind if they get stained. . Valuables: Do not  bring any jewelry or valuables.  Procedure appointments are reserved for interventional treatments only. Marland Kitchen No Prescription Refills. . No medication changes will be discussed during procedure appointments. . No disability issues will be discussed.  Reasons to call and reschedule or cancel your procedure: (Following these recommendations will minimize the risk of a serious complication.) . Surgeries: Avoid having procedures within 2 weeks of any surgery. (Avoid for 2 weeks before or after any surgery). . Flu Shots: Avoid having procedures within 2 weeks of a flu shots or . (Avoid for 2 weeks before or after immunizations). . Barium: Avoid having a procedure within 7-10 days after having had a radiological study involving the use of radiological contrast. (Myelograms, Barium swallow or enema study). . Heart attacks: Avoid any elective procedures or surgeries for the initial 6 months after a "Myocardial Infarction" (Heart Attack). . Blood thinners: It is imperative that you stop these medications before procedures. Let us know if you if you take any blood thinner.  . Infection: Avoid procedures during or within two weeks of an infection (including chest colds or gastrointestinal problems). Symptoms associated with infections include: Localized redness, fever, chills, night sweats or profuse sweating, burning sensation when voiding, cough, congestion, stuffiness, runny nose, sore throat, diarrhea, nausea, vomiting, cold or Flu symptoms, recent or current infections. It is specially important if the infection is over the area that we intend to treat. Marland Kitchen Heart  and lung problems: Symptoms that may suggest an active cardiopulmonary problem include: cough, chest pain, breathing difficulties or shortness of breath, dizziness, ankle swelling, uncontrolled high or unusually low blood pressure, and/or palpitations. If you are experiencing any of these symptoms, cancel your procedure and contact your primary care  physician for an evaluation.  Remember:  Regular Business hours are:  Monday to Thursday 8:00 AM to 4:00 PM  Provider's Schedule: Milinda Pointer, MD:  Procedure days: Tuesday and Thursday 7:30 AM to 4:00 PM  Gillis Santa, MD:  Procedure days: Monday and Wednesday 7:30 AM to 4:00 PM ____________________________________________________________________________________________

## 2018-02-16 NOTE — Progress Notes (Signed)
Safety precautions to be maintained throughout the outpatient stay will include: orient to surroundings, keep bed in low position, maintain call bell within reach at all times, provide assistance with transfer out of bed and ambulation.  

## 2018-02-16 NOTE — Patient Instructions (Signed)
____________________________________________________________________________________________  Genicular Nerve Block  What is a genicular nerve block? A genicular nerve block is the injection of a local anesthetic to block the nerves that transmits pain from the knee.  What is the purpose of a facet nerve block? A genicular nerve block is a diagnostic procedure to determine if the pathologic changes (i.e. arthritis, meniscal tears, etc) and inflammation within the knee joint is the source of your knee pain. It also confirms that the knee pain will respond well to the actual treatment procedure. If a genicular nerve block works, it will give you relief for several hours. After that, the pain is expected to return to normal. This test is always performed twice (usually a week or two apart) because two successful tests are required to move onto treatment. If both diagnostic tests are positive, then we schedule a treatment called radiofrequency (RF) ablation. In this procedure, the same nerves are cauterized, which typically leads to pain relief for 4 -18 months. If this process works well for one knee, it can be performed on the other knee if needed.  How is the procedure performed? You will be placed on the procedure table. The injection site is sterilized with either iodine or chlorhexadine. The site to be injected is numbed with a local anesthetic, and a needle is directed to the target area. X-ray guidance is used to ensure proper placement and positioning of the needle. When the needle is properly positioned near the genicular nerve, local anesthetic is injected to numb that nerve. This will be repeated at multiple sites around the knee to block all genicular nerves.  Will the procedure be painful? The injection can be painful and we therefore provide the option of receiving IV sedation. IV sedation, combined with local anesthetic, can make the injection nearly pain free. It allows you to remain very  still during the procedure, which can also make the injection easier, faster, and more successful. If you decide to have IV sedation, you must have a driver to get you home safely afterwards. In addition, you cannot have anything to eat or drink within 8 hours of your appointment (clear liquids are allowed until 3 hours before the procedure). If you take medications for diabetes, these medications may need to be adjusted the morning of the procedure. Your primary care physician can help you with this adjustment.  What are the discharge instructions? If you received IV sedation do not drive or operate machinery for at least 24 hours after the procedure. You may return to work the next day following your procedure. You may resume your normal diet immediately. Do not engage in any strenuous activity for 24 hours. You should, however, engage in moderate activity that typically causes your ususal pain. If the block works, those activities should not be painful for several hours after the injection. Do not take a bath, swim, or use a hot tub for 24 hours (you may take a shower). Call the office if you have any of the following: severe pain afterwards (different than your usual symptoms), redness/swelling/discharge at the injection site(s), fevers/chills, difficulty with bowel or bladder functions.  What are the risks and side effects? The complication rate for this procedure is very low. Whenever a needle enters the skin, bleeding or infection can occur. Some other serious but extremely rare risks include paralysis and death. You may have an allergic reaction to any of the medications used. If you have a known allergy to any medications, especially local anesthetics, notify  our staff before the procedure takes place. You may experience any of the following side effects up to 4 - 6 hours after the procedure: . Leg muscle weakness or numbness may occur due to the local anesthetic affecting the nerves that control  your legs (this is a temporary affect and it is not paralysis). If you have any leg weakness or numbness, walk only with assistance in order to prevent falls and injury. Your leg strength will return slowly and completely. . Dizziness may occur due to a decrease in your blood pressure. If this occurs, remain in a seated or lying position. Gradually sit up, and then stand after at least 10 minutes of sitting. . Mild headaches may occur. Drink fluids and take pain medications if needed. If the headaches persist or become severe, call the office. . Mild discomfort at the injection site can occur. This typically lasts for a few hours but can persist for a couple days. If this occurs, take anti-inflammatories or pain medications, apply ice to the area the day of the procedure. If it persists, apply moist heat in the day(s) following.  The side effects listed above can be normal. They are not dangerous and will resolve on their own. If, however, you experience any of the following, a complication may have occurred and you should either contact your doctor. If he is not readily available, then you should proceed to the closest urgent care center for evaluation: . Severe or progressive pain at the injection site(s) . Arm or leg weakness that progressively worsens or persists for longer than 8 hours . Severe or progressive redness, swelling, or discharge from the injections site(s) . Fevers, chills, nausea, or vomiting . Bowel or bladder dysfunction (i.e. inability to urinate or pass stool or difficulty controlling either)  How long does it take for the procedure to work? You should feel relief from your usual pain within the first hour. Again, this is only expected to last for several hours, at the most. Remember, you may be sore in the middle part of your back from the needles, and you must distinguish this from your usual  pain. ____________________________________________________________________________________________  ____________________________________________________________________________________________  Preparing for your procedure (without sedation)  Instructions: . Oral Intake: Do not eat or drink anything for at least 3 hours prior to your procedure. . Transportation: Unless otherwise stated by your physician, you may drive yourself after the procedure. . Blood Pressure Medicine: Take your blood pressure medicine with a sip of water the morning of the procedure. . Blood thinners: Notify our staff if you are taking any blood thinners. Depending on which one you take, there will be specific instructions on how and when to stop it. . Diabetics on insulin: Notify the staff so that you can be scheduled 1st case in the morning. If your diabetes requires high dose insulin, take only  of your normal insulin dose the morning of the procedure and notify the staff that you have done so. . Preventing infections: Shower with an antibacterial soap the morning of your procedure.  . Build-up your immune system: Take 1000 mg of Vitamin C with every meal (3 times a day) the day prior to your procedure. Marland Kitchen Antibiotics: Inform the staff if you have a condition or reason that requires you to take antibiotics before dental procedures. . Pregnancy: If you are pregnant, call and cancel the procedure. . Sickness: If you have a cold, fever, or any active infections, call and cancel the procedure. . Arrival: You  must be in the facility at least 30 minutes prior to your scheduled procedure. . Children: Do not bring any children with you. . Dress appropriately: Bring dark clothing that you would not mind if they get stained. . Valuables: Do not bring any jewelry or valuables.  Procedure appointments are reserved for interventional treatments only. Marland Kitchen No Prescription Refills. . No medication changes will be discussed during  procedure appointments. . No disability issues will be discussed.  Reasons to call and reschedule or cancel your procedure: (Following these recommendations will minimize the risk of a serious complication.) . Surgeries: Avoid having procedures within 2 weeks of any surgery. (Avoid for 2 weeks before or after any surgery). . Flu Shots: Avoid having procedures within 2 weeks of a flu shots or . (Avoid for 2 weeks before or after immunizations). . Barium: Avoid having a procedure within 7-10 days after having had a radiological study involving the use of radiological contrast. (Myelograms, Barium swallow or enema study). . Heart attacks: Avoid any elective procedures or surgeries for the initial 6 months after a "Myocardial Infarction" (Heart Attack). . Blood thinners: It is imperative that you stop these medications before procedures. Let us know if you if you take any blood thinner.  . Infection: Avoid procedures during or within two weeks of an infection (including chest colds or gastrointestinal problems). Symptoms associated with infections include: Localized redness, fever, chills, night sweats or profuse sweating, burning sensation when voiding, cough, congestion, stuffiness, runny nose, sore throat, diarrhea, nausea, vomiting, cold or Flu symptoms, recent or current infections. It is specially important if the infection is over the area that we intend to treat. Marland Kitchen Heart and lung problems: Symptoms that may suggest an active cardiopulmonary problem include: cough, chest pain, breathing difficulties or shortness of breath, dizziness, ankle swelling, uncontrolled high or unusually low blood pressure, and/or palpitations. If you are experiencing any of these symptoms, cancel your procedure and contact your primary care physician for an evaluation.  Remember:  Regular Business hours are:  Monday to Thursday 8:00 AM to 4:00 PM  Provider's Schedule: Milinda Pointer, MD:  Procedure days: Tuesday and  Thursday 7:30 AM to 4:00 PM  Gillis Santa, MD:  Procedure days: Monday and Wednesday 7:30 AM to 4:00 PM ____________________________________________________________________________________________

## 2018-03-09 ENCOUNTER — Ambulatory Visit (HOSPITAL_BASED_OUTPATIENT_CLINIC_OR_DEPARTMENT_OTHER): Payer: Medicare Other | Admitting: Student in an Organized Health Care Education/Training Program

## 2018-03-09 ENCOUNTER — Encounter: Payer: Self-pay | Admitting: Student in an Organized Health Care Education/Training Program

## 2018-03-09 ENCOUNTER — Other Ambulatory Visit: Payer: Self-pay

## 2018-03-09 ENCOUNTER — Ambulatory Visit
Admission: RE | Admit: 2018-03-09 | Discharge: 2018-03-09 | Disposition: A | Discharge status: Home / Self Care | Payer: Medicare Other | Source: Ambulatory Visit | Attending: Student in an Organized Health Care Education/Training Program | Admitting: Student in an Organized Health Care Education/Training Program

## 2018-03-09 VITALS — BP 143/79 | HR 75 | Temp 97.7°F | Resp 10 | Ht 72.0 in | Wt 230.0 lb

## 2018-03-09 DIAGNOSIS — M170 Bilateral primary osteoarthritis of knee: Secondary | ICD-10-CM

## 2018-03-09 DIAGNOSIS — G894 Chronic pain syndrome: Secondary | ICD-10-CM | POA: Diagnosis not present

## 2018-03-09 DIAGNOSIS — Z96651 Presence of right artificial knee joint: Secondary | ICD-10-CM | POA: Insufficient documentation

## 2018-03-09 DIAGNOSIS — Z85828 Personal history of other malignant neoplasm of skin: Secondary | ICD-10-CM | POA: Diagnosis not present

## 2018-03-09 DIAGNOSIS — M25561 Pain in right knee: Secondary | ICD-10-CM | POA: Diagnosis present

## 2018-03-09 DIAGNOSIS — M25562 Pain in left knee: Secondary | ICD-10-CM | POA: Diagnosis present

## 2018-03-09 DIAGNOSIS — Z8546 Personal history of malignant neoplasm of prostate: Secondary | ICD-10-CM | POA: Insufficient documentation

## 2018-03-09 DIAGNOSIS — Z96641 Presence of right artificial hip joint: Secondary | ICD-10-CM | POA: Insufficient documentation

## 2018-03-09 MED ORDER — LACTATED RINGERS IV SOLN: 1000.0000 mL | Freq: Once | INTRAVENOUS | Status: DC

## 2018-03-09 MED ORDER — DEXAMETHASONE SODIUM PHOSPHATE 10 MG/ML IJ SOLN
10.0000 mg | Freq: Once | INTRAMUSCULAR | Status: AC
  Administered 2018-03-09: 10 mg
  Filled 2018-03-09: qty 1

## 2018-03-09 MED ORDER — ROPIVACAINE HCL 2 MG/ML IJ SOLN
10.0000 mL | Freq: Once | INTRAMUSCULAR | Status: AC
  Administered 2018-03-09: 10 mL
  Filled 2018-03-09: qty 10

## 2018-03-09 MED ORDER — FENTANYL CITRATE (PF) 100 MCG/2ML IJ SOLN: 25.0000 ug | INTRAMUSCULAR | Status: DC | PRN

## 2018-03-09 MED ORDER — LIDOCAINE HCL 2 % IJ SOLN
20.0000 mL | Freq: Once | INTRAMUSCULAR | Status: AC
  Administered 2018-03-09: 400 mg
  Filled 2018-03-09: qty 20

## 2018-03-09 NOTE — Progress Notes (Signed)
Patient's Name: Andrew Obrien  MRN: [id number]  Referring Provider: Dion Body, MD  DOB: [birth year]  PCP: Dion Body, MD  DOS: 03/09/2018  Note by: Gillis Santa, MD  Service setting: Ambulatory outpatient  Specialty: Interventional Pain Management  Patient type: Established  Location: [facility] (AMB) Pain Management Facility  Visit type: Interventional Procedure   Primary Reason for Visit: Interventional Pain Management Treatment. CC: Knee Pain (bilaterally, right is worse)  Procedure:          Anesthesia, Analgesia, Anxiolysis:  Type: Genicular Nerves Block (Superior-lateral, Superior-medial, and Inferior-medial Genicular Nerves) #2  CPT: 17510      Primary Purpose: Diagnostic Region: Lateral, Anterior, and Medial aspects of the knee joint, above and below the knee joint proper. Level: Superior and inferior to the knee joint. Target Area: For Genicular Nerve block(s), the targets are: the superior-lateral genicular nerve, located in the lateral distal portion of the femoral shaft as it curves to form the lateral epicondyle, in the region of the distal femoral metaphysis; the superior-medial genicular nerve, located in the medial distal portion of the femoral shaft as it curves to form the medial epicondyle; and the inferior-medial genicular nerve, located in the medial, proximal portion of the tibial shaft, as it curves to form the medial epicondyle, in the region of the proximal tibial metaphysis. Approach: Anterior, percutaneous, ipsilateral approach. Laterality: Bilateral Position: Modified Fowler's position with pillows under the targeted knee(s).  Type: Local Anesthesia Indication(s): Analgesia         Route: Infiltration (/IM) IV Access: Declined Sedation: Declined  Local Anesthetic: Lidocaine 2%   Indications: 1. Bilateral primary osteoarthritis of knee   2. History of total right knee replacement (x2)   3. Chronic pain syndrome    Pain Score: Pre-procedure: 7  /10 Post-procedure: 0-No pain/10  Pre-op Assessment:  Andrew Obrien is a 83 y.o. (year old), male patient, seen today for interventional treatment. 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 Andrew Obrien has a current medication list which includes the following prescription(s): acetaminophen, allopurinol, azelastine, azelastine-fluticasone, calcipotriene, calcium carb-cholecalciferol, calcium-vitamin d, cholecalciferol, diclofenac sodium, duloxetine, fluocinolone, fluticasone, glimepiride, glucosamine-chondroit-vit c-mn, melatonin, metformin, mirabegron er, multivitamin with minerals, NON FORMULARY, olmesartan-hydrochlorothiazide, omeprazole, potassium, pregabalin, ropinirole, rosuvastatin, tamsulosin, temazepam, trazodone, vitamin c, allopurinol, azelastine-fluticasone, enoxaparin, oxycodone, ropinirole, tramadol, and tramadol, and the following Facility-Administered Medications: fentanyl and lactated ringers. His primarily concern today is the Knee Pain (bilaterally, right is worse)  Initial Vital Signs:  Pulse/HCG Rate: 75ECG Heart Rate: 68 Temp: 97.7 F (36.5 C) Resp: 16 BP: 127/76 SpO2: 95 %  BMI: Estimated body mass index is 31.19 kg/m as calculated from the following:   Height as of this encounter: 6' (1.829 m).   Weight as of this encounter: 230 lb (104.3 kg).  Risk Assessment: Allergies: Reviewed. He has No Known Allergies.  Allergy Precautions: None required Coagulopathies: Reviewed. None identified.  Blood-thinner therapy: None at this time Active Infection(s): Reviewed. None identified. Andrew Obrien is afebrile  Site Confirmation: Andrew Obrien was asked to confirm the procedure  and laterality before marking the site Procedure checklist: Completed Consent: Before the procedure and under the influence of no sedative(s), amnesic(s), or anxiolytics, the patient was informed of the treatment options, risks and possible complications. To fulfill our ethical and legal  obligations, as recommended by the American Medical Association's Code of Ethics, I have informed the patient of my clinical impression; the nature and purpose of the treatment or procedure; the risks, benefits, and possible complications of the intervention; the alternatives, including doing nothing; the risk(s) and benefit(s) of the alternative treatment(s) or procedure(s); and the risk(s) and benefit(s) of doing nothing. The patient was provided information about the general risks and possible complications associated with the procedure. These may include, but are not limited to: failure to achieve desired goals, infection, bleeding, organ or nerve damage, allergic reactions, paralysis, and death. In addition, the patient was informed of those risks and complications associated to the procedure, such as failure to decrease pain; infection; bleeding; organ or nerve damage with subsequent damage to sensory, motor, and/or autonomic systems, resulting in permanent pain, numbness, and/or weakness of one or several areas of the body; allergic reactions; (i.e.: anaphylactic reaction); and/or death. Furthermore, the patient was informed of those risks and complications associated with the medications. These include, but are not limited to: allergic reactions (i.e.: anaphylactic or anaphylactoid reaction(s)); adrenal axis suppression; blood sugar elevation that in diabetics may result in ketoacidosis or comma; water retention that in patients with history of congestive heart failure may result in shortness of breath, pulmonary edema, and decompensation with resultant heart failure; weight gain; swelling or edema; medication-induced  neural toxicity; particulate matter embolism and blood vessel occlusion with resultant organ, and/or nervous system infarction; and/or aseptic necrosis of one or more joints. Finally, the patient was informed that Medicine is not an exact science; therefore, there is also the possibility of unforeseen or unpredictable risks and/or possible complications that may result in a catastrophic outcome. The patient indicated having understood very clearly. We have given the patient no guarantees and we have made no promises. Enough time was given to the patient to ask questions, all of which were answered to the patient's satisfaction. Mr. Garcialopez has indicated that he wanted to continue with the procedure. Attestation: I, the ordering provider, attest that I have discussed with the patient the benefits, risks, side-effects, alternatives, likelihood of achieving goals, and potential problems during recovery for the procedure that I have provided informed consent. Date  Time: 03/09/2018 10:02 AM  Pre-Procedure Preparation:  Monitoring: As per clinic protocol. Respiration, ETCO2, SpO2, BP, heart rate and rhythm monitor placed and checked for adequate function Safety Precautions: Patient was assessed for positional comfort and pressure points before starting the procedure. Time-out: I initiated and conducted the "Time-out" before starting the procedure, as per protocol. The patient was asked to participate by confirming the accuracy of the "Time Out" information. Verification of the correct person, site, and procedure were performed and confirmed by me, the nursing staff, and the patient. "Time-out" conducted as per Joint Commission's Universal Protocol (UP.01.01.01). Time: 1117  Description of Procedure:          Area Prepped: Entire knee area, from mid-thigh to mid-shin, lateral, anterior, and medial aspects. Prepping solution: ChloraPrep (2% chlorhexidine gluconate and 70% isopropyl alcohol) Safety  Precautions: Aspiration looking for blood return was conducted prior to all injections. At no point did we inject any substances, as a needle was being advanced. No attempts were made at seeking any paresthesias. Safe injection practices and needle disposal techniques used. Medications properly checked for expiration dates. SDV (single dose vial) medications used. Latex Allergy precautions taken.   Description of the Procedure: Protocol guidelines were followed. The patient was placed in position over the procedure table.  The target area was identified and the area prepped in the usual manner. Skin & deeper tissues infiltrated with local anesthetic. Appropriate amount of time allowed to pass for local anesthetics to take effect. The procedure needles were then advanced to the target area. Proper needle placement secured. Negative aspiration confirmed. Solution injected in intermittent fashion, asking for systemic symptoms every 0.5cc of injectate. The needles were then removed and the area cleansed, making sure to leave some of the prepping solution back to take advantage of its long term bactericidal properties.  Vitals:   03/09/18 1118 03/09/18 1123 03/09/18 1128 03/09/18 1133  BP: (!) 153/81 (!) 157/80 (!) 150/91 (!) 143/79  Pulse:      Resp: 18 13 14 10   Temp:      TempSrc:      SpO2: 96% 96% 99% 95%  Weight:      Height:        Start Time: 1117 hrs. End Time: 1133 hrs. Materials:  Needle(s) Type: Regular needle Gauge: 25G Length: 3.5-in Medication(s): Please see orders for medications and dosing details. 5 cc solution made of 4 cc of 0.2% ropivacaine, 1 cc of Decadron 10 mg/cc.  1.5 cc injected at each level on the right.  Another 5 cc solution made of 4 cc of 0.2% ropivacaine, 1 cc of Decadron, 10 mg/cc.  1.5 cc injected at each level on the left.  Total steroid dose used for both knees equals 20 mg of Decadron. Imaging Guidance (Non-Spinal):          Type of Imaging Technique:  Fluoroscopy Guidance (Non-Spinal) Indication(s): Assistance in needle guidance and placement for procedures requiring needle placement in or near specific anatomical locations not easily accessible without such assistance. Exposure Time: Please see nurses notes. Contrast: Before injecting any contrast, we confirmed that the patient did not have an allergy to iodine, shellfish, or radiological contrast. Once satisfactory needle placement was completed at the desired level, radiological contrast was injected. Contrast injected under live fluoroscopy. No contrast complications. See chart for type and volume of contrast used. Fluoroscopic Guidance: I was personally present during the use of fluoroscopy. "Tunnel Vision Technique" used to obtain the best possible view of the target area. Parallax error corrected before commencing the procedure. "Direction-depth-direction" technique used to introduce the needle under continuous pulsed fluoroscopy. Once target was reached, antero-posterior, oblique, and lateral fluoroscopic projection used confirm needle placement in all planes. Images permanently stored in EMR. Interpretation: I personally interpreted the imaging intraoperatively. Adequate needle placement confirmed in multiple planes. Appropriate spread of contrast into desired area was observed. No evidence of afferent or efferent intravascular uptake. Permanent images saved into the patient's record.  Antibiotic Prophylaxis:   Anti-infectives (From admission, onward)   None     Indication(s): None identified  Post-operative Assessment:  Post-procedure Vital Signs:  Pulse/HCG Rate: 7571 Temp: 97.7 F (36.5 C) Resp: 10 BP: (!) 143/79 SpO2: 95 %  EBL: None  Complications: No immediate post-treatment complications observed by team, or reported by patient.  Note: The patient tolerated the entire procedure well. A repeat set of vitals were taken after the procedure and the patient was kept under  observation following institutional policy, for this type of procedure. Post-procedural neurological assessment was performed, showing return to baseline, prior to discharge. The patient was provided with post-procedure discharge instructions, including a section on how to identify potential problems. Should any problems arise concerning this procedure, the patient was given instructions to immediately contact us, at any time,  without hesitation. In any case, we plan to contact the patient by telephone for a follow-up status report regarding this interventional procedure.  Comments:  No additional relevant information.  Plan of Care    Imaging Orders     DG C-Arm 1-60 Min-No Report Procedure Orders    No procedure(s) ordered today    Medications ordered for procedure: Meds ordered this encounter  Medications  . lactated ringers infusion 1,000 mL  . fentaNYL (SUBLIMAZE) injection 25-100 mcg    Make sure Narcan is available in the pyxis when using this medication. In the event of respiratory depression (RR< 8/min): Titrate NARCAN (naloxone) in increments of 0.1 to 0.2 mg IV at 2-3 minute intervals, until desired degree of reversal.  . ropivacaine (PF) 2 mg/mL (0.2%) (NAROPIN) injection 10 mL  . lidocaine (XYLOCAINE) 2 % (with pres) injection 400 mg  . dexamethasone (DECADRON) injection 10 mg  . dexamethasone (DECADRON) injection 10 mg   Medications administered: We administered ropivacaine (PF) 2 mg/mL (0.2%), lidocaine, dexamethasone, and dexamethasone.  See the medical record for exact dosing, route, and time of administration.  New Prescriptions   No medications on file   Disposition: Discharge home  Discharge Date & Time: 03/09/2018; 1140 hrs.   Physician-requested Follow-up: Return in about 5 weeks (around 04/13/2018) for Post Procedure Evaluation.  Future Appointments  Date Time Provider [place]  04/14/2018  1:30 PM Gillis Santa, MD [facility] None   Primary Care  Physician: Dion Body, MD Location: [facility] Note by: Gillis Santa, MD Date: 03/09/2018; Time: 1:38 PM  Disclaimer:  Medicine is not an exact science. The only guarantee in medicine is that nothing is guaranteed. It is important to note that the decision to proceed with this intervention was based on the information collected from the patient. The Data and conclusions were drawn from the patient's questionnaire, the interview, and the physical examination. Because the information was provided in large part by the patient, it cannot be guaranteed that it has not been purposely or unconsciously manipulated. Every effort has been made to obtain as much relevant data as possible for this evaluation. It is important to note that the conclusions that lead to this procedure are derived in large part from the available data. Always take into account that the treatment will also be dependent on availability of resources and existing treatment guidelines, considered by other Pain Management Practitioners as being common knowledge and practice, at the time of the intervention. For Medico-Legal purposes, it is also important to point out that variation in procedural techniques and pharmacological choices are the acceptable norm. The indications, contraindications, technique, and results of the above procedure should only be interpreted and judged by a Board-Certified Interventional Pain Specialist with extensive familiarity and expertise in the same exact procedure and technique.

## 2018-03-09 NOTE — Patient Instructions (Signed)

## 2018-03-09 NOTE — Progress Notes (Signed)
Safety precautions to be maintained throughout the outpatient stay will include: orient to surroundings, keep bed in low position, maintain call bell within reach at all times, provide assistance with transfer out of bed and ambulation.  

## 2018-03-10 ENCOUNTER — Telehealth: Payer: Self-pay

## 2018-03-10 NOTE — Telephone Encounter (Signed)
Post procedure phone call.  Patient states he is doing good but had difficulty sleeping.  INformed that the steroid was probably the reason that he didn't sleep.  Instructed patient to call us for any further questions or concerns.

## 2018-04-13 DIAGNOSIS — Z8739 Personal history of other diseases of the musculoskeletal system and connective tissue: Secondary | ICD-10-CM | POA: Insufficient documentation

## 2018-04-14 ENCOUNTER — Ambulatory Visit
Payer: Medicare Other | Attending: Student in an Organized Health Care Education/Training Program | Admitting: Student in an Organized Health Care Education/Training Program

## 2018-04-14 ENCOUNTER — Encounter: Payer: Self-pay | Admitting: Student in an Organized Health Care Education/Training Program

## 2018-04-14 ENCOUNTER — Other Ambulatory Visit: Payer: Self-pay

## 2018-04-14 VITALS — BP 138/58 | HR 84 | Temp 98.2°F | Resp 16 | Ht 73.0 in | Wt 221.0 lb

## 2018-04-14 DIAGNOSIS — G894 Chronic pain syndrome: Secondary | ICD-10-CM | POA: Diagnosis not present

## 2018-04-14 DIAGNOSIS — Z7984 Long term (current) use of oral hypoglycemic drugs: Secondary | ICD-10-CM | POA: Diagnosis not present

## 2018-04-14 DIAGNOSIS — E1140 Type 2 diabetes mellitus with diabetic neuropathy, unspecified: Secondary | ICD-10-CM | POA: Diagnosis not present

## 2018-04-14 DIAGNOSIS — K219 Gastro-esophageal reflux disease without esophagitis: Secondary | ICD-10-CM | POA: Insufficient documentation

## 2018-04-14 DIAGNOSIS — I10 Essential (primary) hypertension: Secondary | ICD-10-CM | POA: Diagnosis not present

## 2018-04-14 DIAGNOSIS — M792 Neuralgia and neuritis, unspecified: Secondary | ICD-10-CM | POA: Diagnosis not present

## 2018-04-14 DIAGNOSIS — Z96651 Presence of right artificial knee joint: Secondary | ICD-10-CM | POA: Diagnosis not present

## 2018-04-14 DIAGNOSIS — Z79891 Long term (current) use of opiate analgesic: Secondary | ICD-10-CM | POA: Insufficient documentation

## 2018-04-14 DIAGNOSIS — F329 Major depressive disorder, single episode, unspecified: Secondary | ICD-10-CM | POA: Insufficient documentation

## 2018-04-14 DIAGNOSIS — Z85828 Personal history of other malignant neoplasm of skin: Secondary | ICD-10-CM | POA: Insufficient documentation

## 2018-04-14 DIAGNOSIS — M170 Bilateral primary osteoarthritis of knee: Secondary | ICD-10-CM | POA: Diagnosis not present

## 2018-04-14 DIAGNOSIS — G4733 Obstructive sleep apnea (adult) (pediatric): Secondary | ICD-10-CM | POA: Insufficient documentation

## 2018-04-14 DIAGNOSIS — Z9889 Other specified postprocedural states: Secondary | ICD-10-CM | POA: Diagnosis not present

## 2018-04-14 DIAGNOSIS — Z79899 Other long term (current) drug therapy: Secondary | ICD-10-CM | POA: Insufficient documentation

## 2018-04-14 NOTE — Patient Instructions (Signed)
____________________________________________________________________________________________  Preparing for Procedure with Sedation  Instructions: . Oral Intake: Do not eat or drink anything for at least 8 hours prior to your procedure. . Transportation: Public transportation is not allowed. Bring an adult driver. The driver must be physically present in our waiting room before any procedure can be started. . Physical Assistance: Bring an adult physically capable of assisting you, in the event you need help. This adult should keep you company at home for at least 6 hours after the procedure. . Blood Pressure Medicine: Take your blood pressure medicine with a sip of water the morning of the procedure. . Blood thinners: Notify our staff if you are taking any blood thinners. Depending on which one you take, there will be specific instructions on how and when to stop it. . Diabetics on insulin: Notify the staff so that you can be scheduled 1st case in the morning. If your diabetes requires high dose insulin, take only  of your normal insulin dose the morning of the procedure and notify the staff that you have done so. . Preventing infections: Shower with an antibacterial soap the morning of your procedure. . Build-up your immune system: Take 1000 mg of Vitamin C with every meal (3 times a day) the day prior to your procedure. . Antibiotics: Inform the staff if you have a condition or reason that requires you to take antibiotics before dental procedures. . Pregnancy: If you are pregnant, call and cancel the procedure. . Sickness: If you have a cold, fever, or any active infections, call and cancel the procedure. . Arrival: You must be in the facility at least 30 minutes prior to your scheduled procedure. . Children: Do not bring children with you. . Dress appropriately: Bring dark clothing that you would not mind if they get stained. . Valuables: Do not bring any jewelry or valuables.  Procedure  appointments are reserved for interventional treatments only. . No Prescription Refills. . No medication changes will be discussed during procedure appointments. . No disability issues will be discussed.  Reasons to call and reschedule or cancel your procedure: (Following these recommendations will minimize the risk of a serious complication.) . Surgeries: Avoid having procedures within 2 weeks of any surgery. (Avoid for 2 weeks before or after any surgery). . Flu Shots: Avoid having procedures within 2 weeks of a flu shots or . (Avoid for 2 weeks before or after immunizations). . Barium: Avoid having a procedure within 7-10 days after having had a radiological study involving the use of radiological contrast. (Myelograms, Barium swallow or enema study). . Heart attacks: Avoid any elective procedures or surgeries for the initial 6 months after a "Myocardial Infarction" (Heart Attack). . Blood thinners: It is imperative that you stop these medications before procedures. Let us know if you if you take any blood thinner.  . Infection: Avoid procedures during or within two weeks of an infection (including chest colds or gastrointestinal problems). Symptoms associated with infections include: Localized redness, fever, chills, night sweats or profuse sweating, burning sensation when voiding, cough, congestion, stuffiness, runny nose, sore throat, diarrhea, nausea, vomiting, cold or Flu symptoms, recent or current infections. It is specially important if the infection is over the area that we intend to treat. . Heart and lung problems: Symptoms that may suggest an active cardiopulmonary problem include: cough, chest pain, breathing difficulties or shortness of breath, dizziness, ankle swelling, uncontrolled high or unusually low blood pressure, and/or palpitations. If you are experiencing any of these symptoms, cancel   your procedure and contact your primary care physician for an evaluation.  Remember:   Regular Business hours are:  Monday to Thursday 8:00 AM to 4:00 PM  Provider's Schedule: Francisco Naveira, MD:  Procedure days: Tuesday and Thursday 7:30 AM to 4:00 PM  Bilal Lateef, MD:  Procedure days: Monday and Wednesday 7:30 AM to 4:00 PM ____________________________________________________________________________________________    

## 2018-04-14 NOTE — Progress Notes (Signed)
Patient's Name: Andrew Obrien  MRN: [id number]  Referring Provider: Dion Body, MD  DOB: 1935/02/01  PCP: Dion Body, MD  DOS: 04/14/2018  Note by: Gillis Santa, MD  Service setting: Ambulatory outpatient  Specialty: Interventional Pain Management  Location: [facility] (AMB) Pain Management Facility    Patient type: Established   Primary Reason(s) for Visit: Encounter for post-procedure evaluation of chronic illness with mild to moderate exacerbation CC: Knee Pain (right greater than left)  HPI  Andrew Obrien is a 83 y.o. year old, male patient, who comes Obrien for a post-procedure evaluation. He has Status post total replacement of hip; History of total right knee replacement (x2); Failed back surgical syndrome; Bilateral primary osteoarthritis of knee; Neuropathic pain; Spinal cord stimulator status; and Chronic pain syndrome on their problem list. His primarily concern Obrien is the Knee Pain (right greater than left)  Pain Assessment: Location: Right, Left Knee Radiating: denies Onset: More than a month ago Duration: Chronic pain Quality: Constant, Aching, Sharp, Cramping Severity: 8 /10 (subjective, self-reported pain score)  Effect on ADL: "I cant move around good" Timing: Constant Modifying factors: nothing BP: (!) 138/58  HR: 84  Andrew Obrien comes in Obrien for post-procedure evaluation.  Further details on both, my assessment(s), as well as the proposed treatment plan, please see below.  Post-Procedure Assessment  03/09/2018 Procedure: Bilateral genicular nerve block #2 Pre-procedure pain score:  7/10 Post-procedure pain score: 0/10         Influential Factors: BMI: 29.16 kg/m Intra-procedural challenges: None observed.         Assessment challenges: None detected.              Reported side-effects: None.        Post-procedural adverse reactions or complications: None reported         Sedation: Please see nurses note. When no sedatives are used, the  analgesic levels obtained are directly associated to the effectiveness of the local anesthetics. However, when sedation is provided, the level of analgesia obtained during the initial 1 hour following the intervention, is believed to be the result of a combination of factors. These factors may include, but are not limited to: 1. The effectiveness of the local anesthetics used. 2. The effects of the analgesic(s) and/or anxiolytic(s) used. 3. The degree of discomfort experienced by the patient at the time of the procedure. 4. The patients ability and reliability in recalling and recording the events. 5. The presence and influence of possible secondary gains and/or psychosocial factors. Reported result: Relief experienced during the 1st hour after the procedure: 100 % (Ultra-Short Term Relief)            Interpretative annotation: Clinically appropriate result. Analgesia during this period is likely to be Local Anesthetic and/or IV Sedative (Analgesic/Anxiolytic) related.          Effects of local anesthetic: The analgesic effects attained during this period are directly associated to the localized infiltration of local anesthetics and therefore cary significant diagnostic value as to the etiological location, or anatomical origin, of the pain. Expected duration of relief is directly dependent on the pharmacodynamics of the local anesthetic used. Long-acting (4-6 hours) anesthetics used.  Reported result: Relief during the next 4 to 6 hour after the procedure: 100 % (Short-Term Relief)            Interpretative annotation: Clinically appropriate result. Analgesia during this period is likely to be Local Anesthetic-related.          Long-term benefit: Defined  as the period of time past the expected duration of local anesthetics (1 hour for short-acting and 4-6 hours for long-acting). With the possible exception of prolonged sympathetic blockade from the local anesthetics, benefits during this period are  typically attributed to, or associated with, other factors such as analgesic sensory neuropraxia, antiinflammatory effects, or beneficial biochemical changes provided by agents other than the local anesthetics.  Reported result: Extended relief following procedure: 100 %(lasting 1.5 weeks) (Long-Term Relief)            Interpretative annotation: Clinically possible results. Good relief. No permanent benefit expected. Inflammation plays a part in the etiology to the pain.          Current benefits: Defined as reported results that persistent at this point in time.   Analgesia: 25-50 %            Function: Somewhat improved ROM: Somewhat improved Interpretative annotation: Recurrence of symptoms. No permanent benefit expected. Effective diagnostic intervention.          Interpretation: Results would suggest a successful diagnostic intervention.                  Plan:  Proceed with Radiofrequency Ablation for the purpose of attaining long-term benefits.                Laboratory Chemistry  Inflammation Markers (CRP: Acute Phase) (ESR: Chronic Phase) Lab Results  Component Value Date   CRP 1.1 (H) 04/02/2017   ESRSEDRATE 25 (H) 04/02/2017                         Rheumatology Markers No results found for: RF, ANA, LABURIC, URICUR, LYMEIGGIGMAB, LYMEABIGMQN, HLAB27                      Renal Function Markers Lab Results  Component Value Date   BUN 21 (H) 04/16/2017   CREATININE 1.15 04/16/2017   GFRAA >60 04/16/2017   GFRNONAA 58 (L) 04/16/2017                             Hepatic Function Markers Lab Results  Component Value Date   AST 21 04/02/2017   ALT 22 04/02/2017   ALBUMIN 4.0 04/02/2017   ALKPHOS 81 04/02/2017                        Electrolytes Lab Results  Component Value Date   NA 141 04/16/2017   K 4.2 04/16/2017   CL 104 04/16/2017   CALCIUM 8.9 04/16/2017                        Neuropathy Markers Lab Results  Component Value Date   HGBA1C 8.0 (H)  04/02/2017                        CNS Tests No results found for: COLORCSF, APPEARCSF, RBCCOUNTCSF, WBCCSF, POLYSCSF, LYMPHSCSF, EOSCSF, PROTEINCSF, GLUCCSF, JCVIRUS, CSFOLI, IGGCSF                      Bone Pathology Markers No results found for: [place], [id number], [id number], [id number], 25OHVITD1, 25OHVITD2, 25OHVITD3, TESTOFREE, TESTOSTERONE                       Coagulation Parameters Lab Results  Component Value Date  INR 0.96 04/02/2017   LABPROT 12.7 04/02/2017   APTT 33 04/02/2017   PLT 234 04/16/2017                        Cardiovascular Markers Lab Results  Component Value Date   HGB 12.6 (L) 04/16/2017   HCT 37.8 (L) 04/16/2017                         CA Markers No results found for: CEA, CA125, LABCA2                      Note: Lab results reviewed.  Recent Diagnostic Imaging Results  DG C-Arm 1-60 Min-No Report Fluoroscopy was utilized by the requesting physician.  No radiographic  interpretation.   Complexity Note: Imaging results reviewed. Results shared with Andrew Obrien, using Layman's terms.                         Meds   Current Outpatient Medications:  .  acetaminophen (TYLENOL) 500 MG tablet, Take 1-2 tablets by mouth as needed., Disp: , Rfl:  .  allopurinol (ZYLOPRIM) 300 MG tablet, Take 300 mg by mouth as needed., Disp: , Rfl:  .  azelastine (ASTELIN) 0.1 % nasal spray, Place 1 spray into both nostrils 2 (two) times daily. Use in each nostril as directed, Disp: , Rfl:  .  Azelastine-Fluticasone (DYMISTA) 137-50 MCG/ACT SUSP, Place 1 spray into the nose at bedtime., Disp: , Rfl:  .  calcipotriene (DOVONOX) 0.005 % cream, Apply 1 application topically 2 (two) times daily., Disp: , Rfl:  .  Calcium Carb-Cholecalciferol (CALCIUM 600+D) 600-800 MG-UNIT TABS, Take 1 capsule by mouth 2 (two) times daily., Disp: , Rfl:  .  cholecalciferol (VITAMIN D) 1000 units tablet, Take 1,000 Units by mouth 2 (two) times daily., Disp: , Rfl:  .  DULoxetine  (CYMBALTA) 30 MG capsule, Take 30 mg by mouth 2 (two) times daily., Disp: , Rfl:  .  fluocinolone (SYNALAR) 0.01 % external solution, Apply 1 drop topically 2 (two) times daily as needed (uses in ears)., Disp: , Rfl:  .  fluticasone (FLONASE) 50 MCG/ACT nasal spray, Place 2 sprays into both nostrils at bedtime., Disp: , Rfl:  .  glimepiride (AMARYL) 2 MG tablet, Take 2 mg by mouth daily., Disp: , Rfl:  .  Glucosamine-Chondroit-Vit C-Mn (GLUCOSAMINE CHONDR 1500 COMPLX PO), Take 1 tablet by mouth 2 (two) times daily., Disp: , Rfl:  .  Melatonin 5 MG TABS, Take 1 tablet by mouth at bedtime., Disp: , Rfl:  .  metFORMIN (GLUCOPHAGE) 1000 MG tablet, Take 1,000 mg by mouth 2 (two) times daily with a meal., Disp: , Rfl:  .  mirabegron ER (MYRBETRIQ) 50 MG TB24 tablet, Take 50 mg by mouth daily., Disp: , Rfl:  .  Multiple Vitamins-Minerals (MULTIVITAMIN WITH MINERALS) tablet, Take 1 tablet by mouth daily., Disp: , Rfl:  .  NON FORMULARY, Take 1 tablet by mouth daily. [employer], Disp: , Rfl:  .  olmesartan-hydrochlorothiazide (BENICAR HCT) 40-12.5 MG tablet, Take 1 tablet by mouth daily., Disp: , Rfl:  .  omeprazole (PRILOSEC) 20 MG capsule, Take 20 mg by mouth every morning., Disp: , Rfl:  .  Potassium 99 MG TABS, Take 1 tablet by mouth daily., Disp: , Rfl:  .  pregabalin (LYRICA) 100 MG capsule, Take 100 mg by mouth 2 (two) times daily., Disp: ,  Rfl:  .  rOPINIRole (REQUIP) 1 MG tablet, Take 1 mg by mouth at bedtime., Disp: , Rfl:  .  rosuvastatin (CRESTOR) 10 MG tablet, Take 10 mg by mouth every evening., Disp: , Rfl:  .  tamsulosin (FLOMAX) 0.4 MG CAPS capsule, Take 0.4 mg by mouth daily., Disp: , Rfl:  .  temazepam (RESTORIL) 7.5 MG capsule, Take 7.5 mg by mouth at bedtime., Disp: , Rfl:  .  traZODone (DESYREL) 50 MG tablet, Take 50 mg by mouth daily., Disp: , Rfl:  .  vitamin C (ASCORBIC ACID) 500 MG tablet, Take 500 mg by mouth daily., Disp: , Rfl:  .  allopurinol (ZYLOPRIM) 300 MG tablet, Take 300  mg by mouth daily., Disp: , Rfl:  .  Azelastine-Fluticasone (DYMISTA) 137-50 MCG/ACT SUSP, Inhale 1 spray into the lungs at bedtime., Disp: , Rfl:  .  calcium-vitamin D (OSCAL WITH D) 500-200 MG-UNIT tablet, Take 1 tablet by mouth 2 (two) times daily., Disp: , Rfl:  .  enoxaparin (LOVENOX) 40 MG/0.4ML injection, Inject 0.4 mLs (40 mg total) into the skin daily. (Patient not taking: Reported on 03/09/2018), Disp: 14 Syringe, Rfl: 0 .  oxyCODONE (OXY IR/ROXICODONE) 5 MG immediate release tablet, Take 1 tablet (5 mg total) by mouth every 3 (three) hours as needed for moderate pain ((score 4 to 6)). (Patient not taking: Reported on 03/09/2018), Disp: 60 tablet, Rfl: 0 .  rOPINIRole (REQUIP) 1 MG tablet, Take 1 mg by mouth daily., Disp: , Rfl:  .  traMADol (ULTRAM) 50 MG tablet, Take 1-2 tablets (50-100 mg total) by mouth every 4 (four) hours as needed for moderate pain. (Patient not taking: Reported on 03/09/2018), Disp: 60 tablet, Rfl: 0 .  traMADol (ULTRAM) 50 MG tablet, Take 1-2 tablets (50-100 mg total) by mouth every 4 (four) hours as needed (mild pain). (Patient not taking: Reported on 03/09/2018), Disp: 30 tablet, Rfl: 0  ROS  Constitutional: Denies any fever or chills Gastrointestinal: No reported hemesis, hematochezia, vomiting, or acute GI distress Musculoskeletal: Denies any acute onset joint swelling, redness, loss of ROM, or weakness Neurological: No reported episodes of acute onset apraxia, aphasia, dysarthria, agnosia, amnesia, paralysis, loss of coordination, or loss of consciousness  Allergies  Andrew Obrien has No Known Allergies.  [place]  Drug: Andrew Obrien  reports that he does not use drugs. Alcohol:  reports that he does not drink alcohol. Tobacco:  reports that he has never smoked. He has never used smokeless tobacco. Medical:  has a past medical history of Arthritis, Depression, Diabetes mellitus without complication ([place]), Difficult intubation, GERD (gastroesophageal reflux  disease), Heart murmur, Hypertension, Macular degeneration, Neuropathy, OSA on CPAP, and Prostate cancer ([place]). Surgical: Andrew Obrien  has a past surgical history that includes Pain pump implantation; Cataract extraction w/ intraocular lens  implant, bilateral; Total shoulder replacement (Left); Rotator cuff repair (Left); Lumbar spine surgery; Mouth surgery; prostate cancer; Skin cancer excision; Replacement total knee (Right); and Total hip arthroplasty (Right, 04/14/2017). Family: family history is not on file.  Constitutional Exam  General appearance: Well nourished, well developed, and well hydrated. In no apparent acute distress Vitals:   04/14/18 1323 04/14/18 1325  BP:  (!) 138/58  Pulse: 84   Resp: 16   Temp: 98.2 F (36.8 C)   SpO2: 95%   Weight: 221 lb (100.2 kg)   Height: _0  (1.854 m)    BMI Assessment: Estimated body mass index is 29.16 kg/m as calculated from the following:   Height as of  this encounter: 6' 1" (1.854 m).   Weight as of this encounter: 221 lb (100.2 kg).  BMI interpretation table: BMI level Category Range association with higher incidence of chronic pain  <18 kg/m2 Underweight   18.5-24.9 kg/m2 Ideal body weight   25-29.9 kg/m2 Overweight Increased incidence by 20%  30-34.9 kg/m2 Obese (Class I) Increased incidence by 68%  35-39.9 kg/m2 Severe obesity (Class II) Increased incidence by 136%  >40 kg/m2 Extreme obesity (Class III) Increased incidence by 254%   Patient's current BMI Ideal Body weight  Body mass index is 29.16 kg/m. Ideal body weight: 79.9 kg (176 lb 2.4 oz) Adjusted ideal body weight: 88 kg (194 lb 1.4 oz)   BMI Readings from Last 4 Encounters:  04/14/18 29.16 kg/m  03/09/18 31.19 kg/m  02/16/18 31.19 kg/m  01/26/18 30.52 kg/m   Wt Readings from Last 4 Encounters:  04/14/18 221 lb (100.2 kg)  03/09/18 230 lb (104.3 kg)  02/16/18 230 lb (104.3 kg)  01/26/18 225 lb (102.1 kg)  Psych/Mental status: Alert, oriented x 3  (person, place, & time)       Eyes: PERLA Respiratory: No evidence of acute respiratory distress  Cervical Spine Area Exam  Skin & Axial Inspection: No masses, redness, edema, swelling, or associated skin lesions Alignment: Symmetrical Functional ROM: Unrestricted ROM      Stability: No instability detected Muscle Tone/Strength: Functionally intact. No obvious neuro-muscular anomalies detected. Sensory (Neurological): Unimpaired Palpation: No palpable anomalies              Upper Extremity (UE) Exam    Side: Right upper extremity  Side: Left upper extremity  Skin & Extremity Inspection: Skin color, temperature, and hair growth are WNL. No peripheral edema or cyanosis. No masses, redness, swelling, asymmetry, or associated skin lesions. No contractures.  Skin & Extremity Inspection: Skin color, temperature, and hair growth are WNL. No peripheral edema or cyanosis. No masses, redness, swelling, asymmetry, or associated skin lesions. No contractures.  Functional ROM: Unrestricted ROM          Functional ROM: Unrestricted ROM          Muscle Tone/Strength: Functionally intact. No obvious neuro-muscular anomalies detected.  Muscle Tone/Strength: Functionally intact. No obvious neuro-muscular anomalies detected.  Sensory (Neurological): Unimpaired          Sensory (Neurological): Unimpaired          Palpation: No palpable anomalies              Palpation: No palpable anomalies              Provocative Test(s):  Phalen's test: deferred Tinel's test: deferred Apley's scratch test (touch opposite shoulder):  Action 1 (Across chest): deferred Action 2 (Overhead): deferred Action 3 (LB reach): deferred   Provocative Test(s):  Phalen's test: deferred Tinel's test: deferred Apley's scratch test (touch opposite shoulder):  Action 1 (Across chest): deferred Action 2 (Overhead): deferred Action 3 (LB reach): deferred    Thoracic Spine Area Exam  Skin & Axial Inspection: No masses, redness, or  swelling Alignment: Symmetrical Functional ROM: Unrestricted ROM Stability: No instability detected Muscle Tone/Strength: Functionally intact. No obvious neuro-muscular anomalies detected. Sensory (Neurological): Unimpaired Muscle strength & Tone: No palpable anomalies Lumbar Spine Area Exam  Skin & Axial Inspection:Well healed scar from previous spine surgery detected,spinal cord stimulator IPG palpated midline Alignment:Symmetrical Functional GUY:[id number] ROM Stability:No instability detected Muscle Tone/Strength:Functionally intact. No obvious neuro-muscular anomalies detected. Sensory (Neurological):Dermatomal pain patternand musculoskeletal Palpation:No palpable anomalies Provocative Tests: Lumbar Hyperextension/rotation  test:(+)bilaterally for facet joint pain. Lumbar quadrant test (Kemp's test):deferred Obrien Lumbar Lateral bending test:deferred Obrien Patrick's Maneuver:deferred Obrien FABER test:deferred Obrien Thigh-thrust test:deferred Obrien S-I compression test:deferred Obrien S-I distraction test:deferred Obrien  Gait & Posture Assessment  Ambulation:Patient ambulates using a cane Gait:Limited. Using assistive device to ambulate Posture:Difficulty standing up straight, due to pain  Lower Extremity Exam    Side:Right lower extremity  Side:Left lower extremity  Stability:Unstable  Stability:Unstable  Skin & Extremity Inspection:Evidence of prior arthroplastic surgery  Skin & Extremity Inspection:Skin color, temperature, and hair growth are WNL. No peripheral edema or cyanosis. No masses, redness, swelling, asymmetry, or associated skin lesions. No contractures.  Functional LOV:[id number] ROMfor knee joint   Functional OAC:[id number] ROMfor knee joint   Muscle Tone/Strength:Functionally intact. No obvious neuro-muscular anomalies detected.   Muscle Tone/Strength:Functionally intact. No obvious neuro-muscular anomalies detected.  Sensory (Neurological):Arthropathic arthralgia  Sensory (Neurological):Arthropathic arthralgia  Palpation:No palpable anomalies  Palpation:No palpable anomalies    Assessment  Primary Diagnosis & Pertinent Problem List: The primary encounter diagnosis was Bilateral primary osteoarthritis of knee. Diagnoses of History of total right knee replacement (x2), Chronic pain syndrome, and Neuropathic pain were also pertinent to this visit.  Status Diagnosis  Controlled Controlled Controlled 1. Bilateral primary osteoarthritis of knee   2. History of total right knee replacement (x2)   3. Chronic pain syndrome   4. Neuropathic pain      General Recommendations: The pain condition that the patient suffers from is best treated with a multidisciplinary approach that involves an increase in physical activity to prevent de-conditioning and worsening of the pain cycle, as well as psychological counseling (formal and/or informal) to address the co-morbid psychological affects of pain. Treatment will often involve judicious use of pain medications and interventional procedures to decrease the pain, allowing the patient to participate in the physical activity that will ultimately produce long-lasting pain reductions. The goal of the multidisciplinary approach is to return the patient to a higher level of overall function and to restore their ability to perform activities of daily living.  83 year old male with a history of bilateral knee osteoarthritis status post 2 right knee replacement surgeries 1 including a revision, axial low back and buttock pain secondary to failed back surgical syndrome with spinal cord stimulator in place [facility]) along with bilateral shoulder osteoarthritis (history of rotator cuff surgeries) who presents Obrien with a primary complaint of right greater than left knee pain.    Patient returns Obrien for follow-up status post bilateral genicular nerve block #1 performed on 03/09/2018 which resulted in approximately 100% pain relief for the first 1.5 weeks with gradual return of pain thereafter.  Patient is endorsing ongoing pain relief is approximately 25 to 30%.  He is status post 2 diagnostic bilateral genicular nerve blocks both of which provided greater than 50% pain relief for at least 10 days.  During this time, patient had reduced levels of overall pain and improved functional status.  We discussed radiofrequency ablation of bilateral genicular nerves.  Risks and benefits were discussed and patient would like to proceed.  Plan of Care   Lab-work, procedure(s), and/or referral(s): Orders Placed This Encounter  Procedures  . Radiofrequency,Genicular   Provider-requested follow-up: Return in about 15 days (around 04/29/2018) for Procedure.  Future Appointments  Date Time Provider [place]  05/06/2018  8:00 AM Gillis Santa, MD [facility] None   Time Note: Greater than 50% of the 25 minute(s) of face-to-face time spent with Andrew Obrien, was spent in counseling/coordination of  care regarding: Andrew Obrien primary cause of pain, the treatment plan, treatment alternatives, the risks and possible complications of proposed treatment, going over the informed consent, the results, interpretation and significance of  his recent diagnostic interventional treatment(s), realistic expectations and the goals of pain management (increased in functionality).  Primary Care Physician: Dion Body, MD Location: [facility] Note by: Gillis Santa, M.D Date: 04/14/2018; Time: 3:20 PM  Patient Instructions  ____________________________________________________________________________________________  Preparing for Procedure with Sedation  Instructions: . Oral Intake: Do not eat or drink anything for at least 8 hours prior to your  procedure. . Transportation: Public transportation is not allowed. Bring an adult driver. The driver must be physically present in our waiting room before any procedure can be started. [place] Physical Assistance: Bring an adult physically capable of assisting you, in the event you need help. This adult should keep you company at home for at least 6 hours after the procedure. . Blood Pressure Medicine: Take your blood pressure medicine with a sip of water the morning of the procedure. . Blood thinners: Notify our staff if you are taking any blood thinners. Depending on which one you take, there will be specific instructions on how and when to stop it. . Diabetics on insulin: Notify the staff so that you can be scheduled 1st case in the morning. If your diabetes requires high dose insulin, take only  of your normal insulin dose the morning of the procedure and notify the staff that you have done so. . Preventing infections: Shower with an antibacterial soap the morning of your procedure. . Build-up your immune system: Take 1000 mg of Vitamin C with every meal (3 times a day) the day prior to your procedure. Marland Kitchen Antibiotics: Inform the staff if you have a condition or reason that requires you to take antibiotics before dental procedures. . Pregnancy: If you are pregnant, call and cancel the procedure. . Sickness: If you have a cold, fever, or any active infections, call and cancel the procedure. . Arrival: You must be in the facility at least 30 minutes prior to your scheduled procedure. . Children: Do not bring children with you. . Dress appropriately: Bring dark clothing that you would not mind if they get stained. . Valuables: Do not bring any jewelry or valuables.  Procedure appointments are reserved for interventional treatments only. Marland Kitchen No Prescription Refills. . No medication changes will be discussed during procedure appointments. . No disability issues will be discussed.  Reasons to call and  reschedule or cancel your procedure: (Following these recommendations will minimize the risk of a serious complication.) . Surgeries: Avoid having procedures within 2 weeks of any surgery. (Avoid for 2 weeks before or after any surgery). . Flu Shots: Avoid having procedures within 2 weeks of a flu shots or . (Avoid for 2 weeks before or after immunizations). . Barium: Avoid having a procedure within 7-10 days after having had a radiological study involving the use of radiological contrast. (Myelograms, Barium swallow or enema study). . Heart attacks: Avoid any elective procedures or surgeries for the initial 6 months after a "Myocardial Infarction" (Heart Attack). . Blood thinners: It is imperative that you stop these medications before procedures. Let us know if you if you take any blood thinner.  . Infection: Avoid procedures during or within two weeks of an infection (including chest colds or gastrointestinal problems). Symptoms associated with infections include: Localized redness, fever, chills, night sweats or profuse sweating, burning sensation when voiding, cough,  congestion, stuffiness, runny nose, sore throat, diarrhea, nausea, vomiting, cold or Flu symptoms, recent or current infections. It is specially important if the infection is over the area that we intend to treat. Marland Kitchen Heart and lung problems: Symptoms that may suggest an active cardiopulmonary problem include: cough, chest pain, breathing difficulties or shortness of breath, dizziness, ankle swelling, uncontrolled high or unusually low blood pressure, and/or palpitations. If you are experiencing any of these symptoms, cancel your procedure and contact your primary care physician for an evaluation.  Remember:  Regular Business hours are:  Monday to Thursday 8:00 AM to 4:00 PM  Provider's Schedule: Milinda Pointer, MD:  Procedure days: Tuesday and Thursday 7:30 AM to 4:00 PM  Gillis Santa, MD:  Procedure days: Monday and Wednesday  7:30 AM to 4:00 PM ____________________________________________________________________________________________

## 2018-04-14 NOTE — Progress Notes (Signed)
Safety precautions to be maintained throughout the outpatient stay will include: orient to surroundings, keep bed in low position, maintain call bell within reach at all times, provide assistance with transfer out of bed and ambulation.  

## 2018-04-27 ENCOUNTER — Ambulatory Visit: Payer: Medicare Other | Admitting: Student in an Organized Health Care Education/Training Program

## 2018-05-06 ENCOUNTER — Ambulatory Visit
Admission: RE | Admit: 2018-05-06 | Discharge: 2018-05-06 | Disposition: A | Discharge status: Home / Self Care | Payer: Medicare Other | Source: Ambulatory Visit | Attending: Student in an Organized Health Care Education/Training Program | Admitting: Student in an Organized Health Care Education/Training Program

## 2018-05-06 ENCOUNTER — Ambulatory Visit (HOSPITAL_BASED_OUTPATIENT_CLINIC_OR_DEPARTMENT_OTHER): Payer: Medicare Other | Admitting: Student in an Organized Health Care Education/Training Program

## 2018-05-06 ENCOUNTER — Encounter: Payer: Self-pay | Admitting: Student in an Organized Health Care Education/Training Program

## 2018-05-06 ENCOUNTER — Other Ambulatory Visit: Payer: Self-pay

## 2018-05-06 VITALS — BP 132/64 | HR 65 | Temp 97.5°F | Resp 16 | Ht 72.0 in | Wt 227.0 lb

## 2018-05-06 DIAGNOSIS — M25561 Pain in right knee: Secondary | ICD-10-CM | POA: Diagnosis present

## 2018-05-06 DIAGNOSIS — M170 Bilateral primary osteoarthritis of knee: Secondary | ICD-10-CM | POA: Diagnosis not present

## 2018-05-06 DIAGNOSIS — G894 Chronic pain syndrome: Secondary | ICD-10-CM

## 2018-05-06 DIAGNOSIS — Z96651 Presence of right artificial knee joint: Secondary | ICD-10-CM

## 2018-05-06 DIAGNOSIS — Z7984 Long term (current) use of oral hypoglycemic drugs: Secondary | ICD-10-CM | POA: Diagnosis not present

## 2018-05-06 DIAGNOSIS — Z96641 Presence of right artificial hip joint: Secondary | ICD-10-CM | POA: Insufficient documentation

## 2018-05-06 DIAGNOSIS — C61 Malignant neoplasm of prostate: Secondary | ICD-10-CM | POA: Insufficient documentation

## 2018-05-06 DIAGNOSIS — Z79891 Long term (current) use of opiate analgesic: Secondary | ICD-10-CM | POA: Diagnosis not present

## 2018-05-06 DIAGNOSIS — M25562 Pain in left knee: Secondary | ICD-10-CM | POA: Diagnosis present

## 2018-05-06 DIAGNOSIS — Z85828 Personal history of other malignant neoplasm of skin: Secondary | ICD-10-CM | POA: Insufficient documentation

## 2018-05-06 DIAGNOSIS — Z79899 Other long term (current) drug therapy: Secondary | ICD-10-CM | POA: Diagnosis not present

## 2018-05-06 DIAGNOSIS — F419 Anxiety disorder, unspecified: Secondary | ICD-10-CM | POA: Insufficient documentation

## 2018-05-06 MED ORDER — ROPIVACAINE HCL 2 MG/ML IJ SOLN
10.0000 mL | Freq: Once | INTRAMUSCULAR | Status: AC
  Administered 2018-05-06: 10 mL via PERINEURAL
  Filled 2018-05-06: qty 10

## 2018-05-06 MED ORDER — FENTANYL CITRATE (PF) 100 MCG/2ML IJ SOLN
25.0000 ug | INTRAMUSCULAR | Status: DC | PRN
  Administered 2018-05-06: 50 ug via INTRAVENOUS
  Filled 2018-05-06: qty 2

## 2018-05-06 MED ORDER — DEXAMETHASONE SODIUM PHOSPHATE 10 MG/ML IJ SOLN
10.0000 mg | Freq: Once | INTRAMUSCULAR | Status: AC
  Administered 2018-05-06: 10 mg
  Filled 2018-05-06: qty 1

## 2018-05-06 MED ORDER — LACTATED RINGERS IV SOLN
1000.0000 mL | Freq: Once | INTRAVENOUS | Status: AC
  Administered 2018-05-06: 1000 mL via INTRAVENOUS

## 2018-05-06 MED ORDER — ROPIVACAINE HCL 2 MG/ML IJ SOLN
10.0000 mL | Freq: Once | INTRAMUSCULAR | Status: AC
  Administered 2018-05-06: 10 mL
  Filled 2018-05-06: qty 10

## 2018-05-06 MED ORDER — LIDOCAINE HCL 2 % IJ SOLN
20.0000 mL | Freq: Once | INTRAMUSCULAR | Status: AC
  Administered 2018-05-06: 400 mg
  Filled 2018-05-06: qty 20

## 2018-05-06 NOTE — Progress Notes (Signed)
Safety precautions to be maintained throughout the outpatient stay will include: orient to surroundings, keep bed in low position, maintain call bell within reach at all times, provide assistance with transfer out of bed and ambulation.  

## 2018-05-06 NOTE — Patient Instructions (Signed)

## 2018-05-06 NOTE — Progress Notes (Signed)
Patient is patient's Name: Andrew Obrien  MRN: [id number]  Referring Provider: Dion Body, MD  DOB: 1935/04/09  PCP: Dion Body, MD  DOS: 05/06/2018  Note by: Gillis Santa, MD  Service setting: Ambulatory outpatient  Specialty: Interventional Pain Management  Patient type: Established  Location: [facility] (AMB) Pain Management Facility  Visit type: Interventional Procedure   Primary Reason for Visit: Interventional Pain Management Treatment. CC: Knee Pain (bilaterally)  Procedure:          Anesthesia, Analgesia, Anxiolysis:  Type: Therapeutic Superior-lateral, Superior-medial, and Inferior-medial, Genicular Nerve Radiofrequency Ablation.  #1  Region: Lateral, Anterior, and Medial aspects of the knee joint, above and below the knee joint proper. Level: Superior and inferior to the knee joint. Laterality: Bilateral  Type: Moderate (Conscious) Sedation combined with Local Anesthesia Indication(s): Analgesia and Anxiety Route: Intravenous (IV) IV Access: Secured Sedation: Meaningful verbal contact was maintained at all times during the procedure  Local Anesthetic: Lidocaine 1-2%  Position: Supine   Indications: 1. Bilateral primary osteoarthritis of knee   2. History of total right knee replacement (x2)   3. Chronic pain syndrome    Mr. Kimberley has been dealing with the above chronic pain for longer than three months and has either failed to respond, was unable to tolerate, or simply did not get enough benefit from other more conservative therapies including, but not limited to: 1. Over-the-counter medications 2. Anti-inflammatory medications 3. Muscle relaxants 4. Membrane stabilizers 5. Opioids 6. Physical therapy and/or chiropractic manipulation 7. Modalities (Heat, ice, etc.) 8. Invasive techniques such as nerve blocks. Mr. Rueb has attained more than 50% relief of the pain from a series of diagnostic injections conducted in separate occasions.  Pain  Score: Pre-procedure: 8 /10 Post-procedure: 0-No pain/10  Pre-op Assessment:  Mr. Gibler is a 83 y.o. (year old), male patient, seen today for interventional treatment. 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 Mr. Frye has a current medication list which includes the following prescription(s): acetaminophen, allopurinol, allopurinol, azelastine, azelastine-fluticasone, azelastine-fluticasone, calcipotriene, calcium carb-cholecalciferol, calcium-vitamin d, cholecalciferol, duloxetine, fluocinolone, fluticasone, glimepiride, glucosamine-chondroit-vit c-mn, melatonin, metformin, mirabegron er, multivitamin with minerals, NON FORMULARY, olmesartan-hydrochlorothiazide, omeprazole, potassium, pregabalin, ropinirole, ropinirole, rosuvastatin, tamsulosin, temazepam, trazodone, vitamin c, enoxaparin, oxycodone, tramadol, and tramadol, and the following Facility-Administered Medications: fentanyl. His primarily concern today is the Knee Pain (bilaterally)  Initial Vital Signs:  Pulse/HCG Rate: 65ECG Heart Rate: 60 Temp: 97.8 F (36.6 C) Resp: 18 BP: 134/64 SpO2: 95 %  BMI: Estimated body mass index is 30.79 kg/m as calculated from the following:   Height as of this encounter: 6' (1.829 m).   Weight as of this encounter: 227 lb (103 kg).  Risk Assessment: Allergies: Reviewed. He has No Known Allergies.  Allergy Precautions: None required Coagulopathies: Reviewed. None identified.  Blood-thinner therapy: None at this time Active Infection(s): Reviewed. None identified. Mr. Bojarski is afebrile  Site Confirmation: Mr. Hartland was asked to confirm the procedure and laterality before marking  the site Procedure checklist: Completed Consent: Before the procedure and under the influence of no sedative(s), amnesic(s), or anxiolytics, the patient was informed of the treatment options, risks and possible complications. To fulfill our ethical and legal obligations, as recommended by the American Medical Association's Code of Ethics, I have informed the patient of  my clinical impression; the nature and purpose of the treatment or procedure; the risks, benefits, and possible complications of the intervention; the alternatives, including doing nothing; the risk(s) and benefit(s) of the alternative treatment(s) or procedure(s); and the risk(s) and benefit(s) of doing nothing. The patient was provided information about the general risks and possible complications associated with the procedure. These may include, but are not limited to: failure to achieve desired goals, infection, bleeding, organ or nerve damage, allergic reactions, paralysis, and death. In addition, the patient was informed of those risks and complications associated to the procedure, such as failure to decrease pain; infection; bleeding; organ or nerve damage with subsequent damage to sensory, motor, and/or autonomic systems, resulting in permanent pain, numbness, and/or weakness of one or several areas of the body; allergic reactions; (i.e.: anaphylactic reaction); and/or death. Furthermore, the patient was informed of those risks and complications associated with the medications. These include, but are not limited to: allergic reactions (i.e.: anaphylactic or anaphylactoid reaction(s)); adrenal axis suppression; blood sugar elevation that in diabetics may result in ketoacidosis or comma; water retention that in patients with history of congestive heart failure may result in shortness of breath, pulmonary edema, and decompensation with resultant heart failure; weight gain; swelling or edema; medication-induced neural toxicity; particulate  matter embolism and blood vessel occlusion with resultant organ, and/or nervous system infarction; and/or aseptic necrosis of one or more joints. Finally, the patient was informed that Medicine is not an exact science; therefore, there is also the possibility of unforeseen or unpredictable risks and/or possible complications that may result in a catastrophic outcome. The patient indicated having understood very clearly. We have given the patient no guarantees and we have made no promises. Enough time was given to the patient to ask questions, all of which were answered to the patient's satisfaction. Mr. Yepes has indicated that he wanted to continue with the procedure. Attestation: I, the ordering provider, attest that I have discussed with the patient the benefits, risks, side-effects, alternatives, likelihood of achieving goals, and potential problems during recovery for the procedure that I have provided informed consent. Date  Time: 05/06/2018  7:42 AM  Pre-Procedure Preparation:  Monitoring: As per clinic protocol. Respiration, ETCO2, SpO2, BP, heart rate and rhythm monitor placed and checked for adequate function Safety Precautions: Patient was assessed for positional comfort and pressure points before starting the procedure. Time-out: I initiated and conducted the "Time-out" before starting the procedure, as per protocol. The patient was asked to participate by confirming the accuracy of the "Time Out" information. Verification of the correct person, site, and procedure were performed and confirmed by me, the nursing staff, and the patient. "Time-out" conducted as per Joint Commission's Universal Protocol (UP.01.01.01). Time: 1517  Description of Procedure:          Target Area: For Genicular Nerve block(s), the targets are: the superior-lateral genicular nerve, located in the lateral distal portion of the femoral shaft as it curves to form the lateral epicondyle, in the region of the distal  femoral metaphysis; the superior-medial genicular nerve, located in the medial distal portion of the femoral shaft as it curves to form the medial epicondyle; and the inferior-medial genicular nerve, located in the medial, proximal portion of the tibial shaft, as it curves to form the medial epicondyle, in the region of the proximal tibial metaphysis. Approach: Anterior, ipsilateral approach. Area Prepped: Entire knee area, from mid-thigh to mid-shin, lateral, anterior, and medial aspects. Prepping solution: Hibiclens (4.0% Chlorhexidine gluconate solution) Safety Precautions:  Aspiration looking for blood return was conducted prior to all injections. At no point did we inject any substances, as a needle was being advanced. No attempts were made at seeking any paresthesias. Safe injection practices and needle disposal techniques used. Medications properly checked for expiration dates. SDV (single dose vial) medications used. Description of the Procedure: Protocol guidelines were followed. The patient was placed in position over the procedure table. The target area was identified and the area prepped in the usual manner. The skin and muscle were infiltrated with local anesthetic. Appropriate amount of time allowed to pass for local anesthetics to take effect. Radiofrequency needles were introduced to the target area using fluoroscopic guidance. Using the NeuroTherm NT1100 Radiofrequency Generator, sensory stimulation using 50 Hz was used to locate & identify the nerve, making sure that the needle was positioned such that there was no sensory stimulation below 0.3 V or above 0.7 V. Stimulation using 2 Hz was used to evaluate the motor component. Care was taken not to lesion any nerves that demonstrated motor stimulation of the lower extremities at an output of less than 2.5 times that of the sensory threshold, or a maximum of 2.0 V. Once satisfactory placement of the needles was achieved, the numbing solution was  slowly injected after negative aspiration. After waiting for at least 2 minutes, the ablation was performed at 80 degrees C for 60 seconds, using regular Radiofrequency settings. Once the procedure was completed, the needles were then removed and the area cleansed, making sure to leave some of the prepping solution back to take advantage of its long term bactericidal properties. Intra-operative Compliance: Compliant Vitals:   05/06/18 0909 05/06/18 0920 05/06/18 0930 05/06/18 0940  BP: 140/73 (!) 159/73 131/82 132/64  Pulse:      Resp: 18 16 19 16   Temp:  (!) 97.5 F (36.4 C)    TempSrc:      SpO2: 94% 94% 94% 96%  Weight:      Height:        Start Time: 0832 hrs. End Time: 0908 hrs. Materials & Medications:  Needle(s) Type: Teflon-coated, curved tip, Radiofrequency needle(s) Gauge: 22G Length: 10cm Medication(s): Please see orders for medications and dosing details. 5 cc solution consisting of 4 cc of 0.2% ropivacaine, 1 cc of Decadron 10 mg/cc.  1.5 cc injected at each level for the left knee prior to lesioning 5 cc solution consisting of 4 cc of 0.2% ropivacaine, 1 cc of Decadron 10 mg/cc.  1.5 cc injected at each level for the right knee prior to lesioning Total steroid dose: 20 mg Decadron Imaging Guidance (Non-Spinal):          Type of Imaging Technique: Fluoroscopy Guidance (Non-Spinal) Indication(s): Assistance in needle guidance and placement for procedures requiring needle placement in or near specific anatomical locations not easily accessible without such assistance. Exposure Time: Please see nurses notes. Contrast: None used. Fluoroscopic Guidance: I was personally present during the use of fluoroscopy. "Tunnel Vision Technique" used to obtain the best possible view of the target area. Parallax error corrected before commencing the procedure. "Direction-depth-direction" technique used to introduce the needle under continuous pulsed fluoroscopy. Once target was reached,  antero-posterior, oblique, and lateral fluoroscopic projection used confirm needle placement in all planes. Images permanently stored in EMR. Interpretation: No contrast injected.  Antibiotic Prophylaxis:   Anti-infectives (From admission, onward)   None     Indication(s): None identified  Post-operative Assessment:  Post-procedure Vital Signs:  Pulse/HCG Rate: 6565 Temp: (!) 97.5 F (  36.4 C) Resp: 16 BP: 132/64 SpO2: 96 %  EBL: None  Complications: No immediate post-treatment complications observed by team, or reported by patient.  Note: The patient tolerated the entire procedure well. A repeat set of vitals were taken after the procedure and the patient was kept under observation following institutional policy, for this type of procedure. Post-procedural neurological assessment was performed, showing return to baseline, prior to discharge. The patient was provided with post-procedure discharge instructions, including a section on how to identify potential problems. Should any problems arise concerning this procedure, the patient was given instructions to immediately contact us, at any time, without hesitation. In any case, we plan to contact the patient by telephone for a follow-up status report regarding this interventional procedure.  Comments:  No additional relevant information.  Plan of Care    Imaging Orders     DG C-Arm 1-60 Min-No Report Procedure Orders    No procedure(s) ordered today    Medications ordered for procedure: Meds ordered this encounter  Medications  . lactated ringers infusion 1,000 mL  . fentaNYL (SUBLIMAZE) injection 25-100 mcg    Make sure Narcan is available in the pyxis when using this medication. In the event of respiratory depression (RR< 8/min): Titrate NARCAN (naloxone) in increments of 0.1 to 0.2 mg IV at 2-3 minute intervals, until desired degree of reversal.  . lidocaine (XYLOCAINE) 2 % (with pres) injection 400 mg  . lidocaine  (XYLOCAINE) 2 % (with pres) injection 400 mg  . ropivacaine (PF) 2 mg/mL (0.2%) (NAROPIN) injection 10 mL  . dexamethasone (DECADRON) injection 10 mg  . dexamethasone (DECADRON) injection 10 mg  . ropivacaine (PF) 2 mg/mL (0.2%) (NAROPIN) injection 10 mL   Medications administered: We administered lactated ringers, fentaNYL, lidocaine, lidocaine, ropivacaine (PF) 2 mg/mL (0.2%), dexamethasone, dexamethasone, and ropivacaine (PF) 2 mg/mL (0.2%).  See the medical record for exact dosing, route, and time of administration.  Disposition: Discharge home  Discharge Date & Time: 05/06/2018; 0941 hrs.   Physician-requested Follow-up: Return in about 5 weeks (around 06/10/2018) for Post Procedure Evaluation.  Future Appointments  Date Time Provider [place]  06/09/2018 10:15 AM Gillis Santa, MD [facility] None   Primary Care Physician: Dion Body, MD Location: [facility] Note by: Gillis Santa, MD Date: 05/06/2018; Time: 11:59 AM  Disclaimer:  Medicine is not an exact science. The only guarantee in medicine is that nothing is guaranteed. It is important to note that the decision to proceed with this intervention was based on the information collected from the patient. The Data and conclusions were drawn from the patient's questionnaire, the interview, and the physical examination. Because the information was provided in large part by the patient, it cannot be guaranteed that it has not been purposely or unconsciously manipulated. Every effort has been made to obtain as much relevant data as possible for this evaluation. It is important to note that the conclusions that lead to this procedure are derived in large part from the available data. Always take into account that the treatment will also be dependent on availability of resources and existing treatment guidelines, considered by other Pain Management Practitioners as being common knowledge and  practice, at the time of the intervention. For Medico-Legal purposes, it is also important to point out that variation in procedural techniques and pharmacological choices are the acceptable norm. The indications, contraindications, technique, and results of the above procedure should only be interpreted and judged by a Board-Certified Interventional Pain Specialist with  extensive familiarity and expertise in the same exact procedure and technique.

## 2018-05-07 ENCOUNTER — Telehealth: Payer: Self-pay

## 2018-05-07 NOTE — Telephone Encounter (Signed)
Post procedure phone call.  States he is doing good.  

## 2018-06-09 ENCOUNTER — Ambulatory Visit
Admission: RE | Admit: 2018-06-09 | Discharge: 2018-06-09 | Disposition: A | Discharge status: Home / Self Care | Payer: Medicare Other | Source: Ambulatory Visit | Attending: Student in an Organized Health Care Education/Training Program | Admitting: Student in an Organized Health Care Education/Training Program

## 2018-06-09 ENCOUNTER — Other Ambulatory Visit: Payer: Self-pay

## 2018-06-09 ENCOUNTER — Ambulatory Visit (HOSPITAL_BASED_OUTPATIENT_CLINIC_OR_DEPARTMENT_OTHER): Payer: Medicare Other | Admitting: Student in an Organized Health Care Education/Training Program

## 2018-06-09 ENCOUNTER — Encounter: Payer: Self-pay | Admitting: Student in an Organized Health Care Education/Training Program

## 2018-06-09 VITALS — BP 152/55 | HR 84 | Temp 98.0°F | Resp 18 | Ht 72.0 in | Wt 230.0 lb

## 2018-06-09 DIAGNOSIS — H3530 Unspecified macular degeneration: Secondary | ICD-10-CM | POA: Diagnosis not present

## 2018-06-09 DIAGNOSIS — Z7951 Long term (current) use of inhaled steroids: Secondary | ICD-10-CM | POA: Diagnosis not present

## 2018-06-09 DIAGNOSIS — F329 Major depressive disorder, single episode, unspecified: Secondary | ICD-10-CM | POA: Insufficient documentation

## 2018-06-09 DIAGNOSIS — Z96643 Presence of artificial hip joint, bilateral: Secondary | ICD-10-CM | POA: Insufficient documentation

## 2018-06-09 DIAGNOSIS — M170 Bilateral primary osteoarthritis of knee: Secondary | ICD-10-CM

## 2018-06-09 DIAGNOSIS — M1612 Unilateral primary osteoarthritis, left hip: Secondary | ICD-10-CM | POA: Diagnosis not present

## 2018-06-09 DIAGNOSIS — M533 Sacrococcygeal disorders, not elsewhere classified: Secondary | ICD-10-CM

## 2018-06-09 DIAGNOSIS — G4733 Obstructive sleep apnea (adult) (pediatric): Secondary | ICD-10-CM | POA: Diagnosis not present

## 2018-06-09 DIAGNOSIS — Z8546 Personal history of malignant neoplasm of prostate: Secondary | ICD-10-CM | POA: Insufficient documentation

## 2018-06-09 DIAGNOSIS — I10 Essential (primary) hypertension: Secondary | ICD-10-CM | POA: Insufficient documentation

## 2018-06-09 DIAGNOSIS — Z85828 Personal history of other malignant neoplasm of skin: Secondary | ICD-10-CM | POA: Diagnosis not present

## 2018-06-09 DIAGNOSIS — M47816 Spondylosis without myelopathy or radiculopathy, lumbar region: Secondary | ICD-10-CM | POA: Diagnosis not present

## 2018-06-09 DIAGNOSIS — Z79899 Other long term (current) drug therapy: Secondary | ICD-10-CM | POA: Insufficient documentation

## 2018-06-09 DIAGNOSIS — G894 Chronic pain syndrome: Secondary | ICD-10-CM | POA: Insufficient documentation

## 2018-06-09 DIAGNOSIS — W19XXXA Unspecified fall, initial encounter: Secondary | ICD-10-CM | POA: Diagnosis not present

## 2018-06-09 DIAGNOSIS — Z96641 Presence of right artificial hip joint: Secondary | ICD-10-CM | POA: Diagnosis not present

## 2018-06-09 DIAGNOSIS — Z7984 Long term (current) use of oral hypoglycemic drugs: Secondary | ICD-10-CM | POA: Diagnosis not present

## 2018-06-09 DIAGNOSIS — Z96612 Presence of left artificial shoulder joint: Secondary | ICD-10-CM | POA: Insufficient documentation

## 2018-06-09 DIAGNOSIS — K219 Gastro-esophageal reflux disease without esophagitis: Secondary | ICD-10-CM | POA: Insufficient documentation

## 2018-06-09 DIAGNOSIS — Z96651 Presence of right artificial knee joint: Secondary | ICD-10-CM | POA: Insufficient documentation

## 2018-06-09 DIAGNOSIS — E1140 Type 2 diabetes mellitus with diabetic neuropathy, unspecified: Secondary | ICD-10-CM | POA: Insufficient documentation

## 2018-06-09 NOTE — Progress Notes (Signed)
Patient's Name: Andrew Obrien  MRN: [id number]  Referring Provider: Dion Body, MD  DOB: May 14, 1935  PCP: Dion Body, MD  DOS: 06/09/2018  Note by: Gillis Santa, MD  Service setting: Ambulatory outpatient  Specialty: Interventional Pain Management  Location: [facility] (AMB) Pain Management Facility    Patient type: Established   Primary Reason(s) for Visit: Encounter for post-procedure evaluation of chronic illness with mild to moderate exacerbation CC: Back Pain (bilateral) and Hip Pain (bilateral)  HPI  Andrew Obrien is a 83 y.o. year old, male patient, who comes today for a post-procedure evaluation. He has Status post total replacement of hip; History of total right knee replacement (x2); Failed back surgical syndrome; Bilateral primary osteoarthritis of knee; Neuropathic pain; Spinal cord stimulator status; and Chronic pain syndrome on their problem list. His primarily concern today is the Back Pain (bilateral) and Hip Pain (bilateral)  Pain Assessment: Location: Lower Back Radiating: radiates into hips and back of legs Onset: More than a month ago Duration: Acute pain Quality: Sharp Severity: 10-Worst pain ever/10 (subjective, self-reported pain score) -low back pain status post fall last week.  However 0 pain in his knees. Note: Reported level is compatible with observation.                         When using our objective Pain Scale, levels between 6 and 10/10 are said to belong in an emergency room, as it progressively worsens from a 6/10, described as severely limiting, requiring emergency care not usually available at an outpatient pain management facility. At a 6/10 level, communication becomes difficult and requires great effort. Assistance to reach the emergency department may be required. Facial flushing and profuse sweating along with potentially dangerous increases in heart rate and blood pressure will be evident. Effect on ADL: "It jurts all the time, no matter what I  do"  Timing: Constant Modifying factors: denies BP: (!) 152/55  HR: 84  Andrew Obrien comes in today for post-procedure evaluation.  Patient follows up for postprocedural assessment status post bilateral genicular nerve RFA.  This was performed on 05/06/2018.  Patient endorses 100% pain relief of bilateral knees.  However last week the patient did sustain a fall on his lower back and sacral area.  He is endorsing severe pain in his sacrum and coccygeal area.  No lower extremity weakness.  Will obtain x-ray.  Further details on both, my assessment(s), as well as the proposed treatment plan, please see below.  Post-Procedure Assessment  05/06/2018 Procedure: Bilateral genicular nerve RFA Pre-procedure pain score:        /10 Post-procedure pain score: 0/10         Influential Factors: BMI: 31.19 kg/m Intra-procedural challenges: None observed.         Assessment challenges: None detected.              Reported side-effects: None.        Post-procedural adverse reactions or complications: None reported         Sedation: Please see nurses note. When no sedatives are used, the analgesic levels obtained are directly associated to the effectiveness of the local anesthetics. However, when sedation is provided, the level of analgesia obtained during the initial 1 hour following the intervention, is believed to be the result of a combination of factors. These factors may include, but are not limited to: 1. The effectiveness of the local anesthetics used. 2. The effects of the analgesic(s) and/or anxiolytic(s) used. 3.  The degree of discomfort experienced by the patient at the time of the procedure. 4. The patients ability and reliability in recalling and recording the events. 5. The presence and influence of possible secondary gains and/or psychosocial factors. Reported result: Relief experienced during the 1st hour after the procedure: 100 % (Ultra-Short Term Relief)            Interpretative  annotation: Clinically appropriate result. Analgesia during this period is likely to be Local Anesthetic and/or IV Sedative (Analgesic/Anxiolytic) related.          Effects of local anesthetic: The analgesic effects attained during this period are directly associated to the localized infiltration of local anesthetics and therefore cary significant diagnostic value as to the etiological location, or anatomical origin, of the pain. Expected duration of relief is directly dependent on the pharmacodynamics of the local anesthetic used. Long-acting (4-6 hours) anesthetics used.  Reported result: Relief during the next 4 to 6 hour after the procedure: 100 % (Short-Term Relief)            Interpretative annotation: Clinically appropriate result. Analgesia during this period is likely to be Local Anesthetic-related.          Long-term benefit: Defined as the period of time past the expected duration of local anesthetics (1 hour for short-acting and 4-6 hours for long-acting). With the possible exception of prolonged sympathetic blockade from the local anesthetics, benefits during this period are typically attributed to, or associated with, other factors such as analgesic sensory neuropraxia, antiinflammatory effects, or beneficial biochemical changes provided by agents other than the local anesthetics.  Reported result: Extended relief following procedure: 100 % (Long-Term Relief)            Interpretative annotation: Clinically possible results. Good relief. No permanent benefit expected. Inflammation plays a part in the etiology to the pain.          Current benefits: Defined as reported results that persistent at this point in time.   Analgesia: 100 %            Function: Andrew Obrien reports improvement in function ROM: Andrew Obrien reports improvement in ROM Interpretative annotation: Complete relief. Therapeutic success. Adequate RF ablation.          Interpretation: Results would suggest Andrew Obrien  to be a good candidate for Radiofrequency Ablation.                  Plan:  Set up procedure as a PRN palliative treatment option for this patient.                Laboratory Chemistry  Inflammation Markers (CRP: Acute Phase) (ESR: Chronic Phase) Lab Results  Component Value Date   CRP 1.1 (H) 04/02/2017   ESRSEDRATE 25 (H) 04/02/2017                         Rheumatology Markers No results found for: RF, ANA, LABURIC, URICUR, LYMEIGGIGMAB, LYMEABIGMQN, HLAB27                      Renal Function Markers Lab Results  Component Value Date   BUN 21 (H) 04/16/2017   CREATININE 1.15 04/16/2017   GFRAA >60 04/16/2017   GFRNONAA 58 (L) 04/16/2017                             Hepatic Function Markers Lab Results  Component Value Date   AST 21 04/02/2017   ALT 22 04/02/2017   ALBUMIN 4.0 04/02/2017   ALKPHOS 81 04/02/2017                        Electrolytes Lab Results  Component Value Date   NA 141 04/16/2017   K 4.2 04/16/2017   CL 104 04/16/2017   CALCIUM 8.9 04/16/2017                        Neuropathy Markers Lab Results  Component Value Date   HGBA1C 8.0 (H) 04/02/2017                        CNS Tests No results found for: COLORCSF, APPEARCSF, RBCCOUNTCSF, WBCCSF, POLYSCSF, LYMPHSCSF, EOSCSF, PROTEINCSF, GLUCCSF, JCVIRUS, CSFOLI, IGGCSF                      Bone Pathology Markers No results found for: VD25OH, [id number], [id number], [id number], 25OHVITD1, 25OHVITD2, 25OHVITD3, TESTOFREE, TESTOSTERONE                       Coagulation Parameters Lab Results  Component Value Date   INR 0.96 04/02/2017   LABPROT 12.7 04/02/2017   APTT 33 04/02/2017   PLT 234 04/16/2017                        Cardiovascular Markers Lab Results  Component Value Date   HGB 12.6 (L) 04/16/2017   HCT 37.8 (L) 04/16/2017                         CA Markers No results found for: CEA, CA125, LABCA2                      Note: Lab results reviewed.  Recent Diagnostic Imaging  Results  DG C-Arm 1-60 Min-No Report Fluoroscopy was utilized by the requesting physician.  No radiographic  interpretation.   Complexity Note: Imaging results reviewed. Results shared with Mr. Campanaro, using Layman's terms.                         Meds   Current Outpatient Medications:  .  acetaminophen (TYLENOL) 500 MG tablet, Take 1-2 tablets by mouth as needed., Disp: , Rfl:  .  allopurinol (ZYLOPRIM) 300 MG tablet, Take 300 mg by mouth daily., Disp: , Rfl:  .  allopurinol (ZYLOPRIM) 300 MG tablet, Take 300 mg by mouth as needed., Disp: , Rfl:  .  azelastine (ASTELIN) 0.1 % nasal spray, Place 1 spray into both nostrils 2 (two) times daily. Use in each nostril as directed, Disp: , Rfl:  .  Azelastine-Fluticasone (DYMISTA) 137-50 MCG/ACT SUSP, Inhale 1 spray into the lungs at bedtime., Disp: , Rfl:  .  calcipotriene (DOVONOX) 0.005 % cream, Apply 1 application topically 2 (two) times daily., Disp: , Rfl:  .  Calcium Carb-Cholecalciferol (CALCIUM 600+D) 600-800 MG-UNIT TABS, Take 1 capsule by mouth 2 (two) times daily., Disp: , Rfl:  .  calcium-vitamin D (OSCAL WITH D) 500-200 MG-UNIT tablet, Take 1 tablet by mouth 2 (two) times daily., Disp: , Rfl:  .  cholecalciferol (VITAMIN D) 1000 units tablet, Take 1,000 Units by mouth 2 (two) times daily., Disp: , Rfl:  .  DULoxetine (CYMBALTA) 30 MG  capsule, Take 30 mg by mouth 2 (two) times daily., Disp: , Rfl:  .  fluocinolone (SYNALAR) 0.01 % external solution, Apply 1 drop topically 2 (two) times daily as needed (uses in ears)., Disp: , Rfl:  .  fluticasone (FLONASE) 50 MCG/ACT nasal spray, Place 2 sprays into both nostrils at bedtime., Disp: , Rfl:  .  glimepiride (AMARYL) 2 MG tablet, Take 2 mg by mouth daily., Disp: , Rfl:  .  Glucosamine-Chondroit-Vit C-Mn (GLUCOSAMINE CHONDR 1500 COMPLX PO), Take 1 tablet by mouth 2 (two) times daily., Disp: , Rfl:  .  Melatonin 5 MG TABS, Take 1 tablet by mouth at bedtime., Disp: , Rfl:  .  metFORMIN  (GLUCOPHAGE) 1000 MG tablet, Take 1,000 mg by mouth 2 (two) times daily with a meal., Disp: , Rfl:  .  mirabegron ER (MYRBETRIQ) 50 MG TB24 tablet, Take 50 mg by mouth daily., Disp: , Rfl:  .  Multiple Vitamins-Minerals (MULTIVITAMIN WITH MINERALS) tablet, Take 1 tablet by mouth daily., Disp: , Rfl:  .  NON FORMULARY, Take 1 tablet by mouth daily. [employer], Disp: , Rfl:  .  olmesartan-hydrochlorothiazide (BENICAR HCT) 40-12.5 MG tablet, Take 1 tablet by mouth daily., Disp: , Rfl:  .  omeprazole (PRILOSEC) 20 MG capsule, Take 20 mg by mouth every morning., Disp: , Rfl:  .  Potassium 99 MG TABS, Take 1 tablet by mouth daily., Disp: , Rfl:  .  pregabalin (LYRICA) 100 MG capsule, Take 100 mg by mouth 2 (two) times daily., Disp: , Rfl:  .  rOPINIRole (REQUIP) 1 MG tablet, Take 1 mg by mouth at bedtime., Disp: , Rfl:  .  rosuvastatin (CRESTOR) 10 MG tablet, Take 10 mg by mouth every evening., Disp: , Rfl:  .  tamsulosin (FLOMAX) 0.4 MG CAPS capsule, Take 0.4 mg by mouth daily., Disp: , Rfl:  .  temazepam (RESTORIL) 7.5 MG capsule, Take 7.5 mg by mouth at bedtime., Disp: , Rfl:  .  traMADol (ULTRAM) 50 MG tablet, Take 1-2 tablets (50-100 mg total) by mouth every 4 (four) hours as needed for moderate pain., Disp: 60 tablet, Rfl: 0 .  traZODone (DESYREL) 50 MG tablet, Take 50 mg by mouth daily., Disp: , Rfl:  .  vitamin C (ASCORBIC ACID) 500 MG tablet, Take 500 mg by mouth daily., Disp: , Rfl:  .  Azelastine-Fluticasone (DYMISTA) 137-50 MCG/ACT SUSP, Place 1 spray into the nose at bedtime., Disp: , Rfl:  .  enoxaparin (LOVENOX) 40 MG/0.4ML injection, Inject 0.4 mLs (40 mg total) into the skin daily. (Patient not taking: Reported on 03/09/2018), Disp: 14 Syringe, Rfl: 0 .  oxyCODONE (OXY IR/ROXICODONE) 5 MG immediate release tablet, Take 1 tablet (5 mg total) by mouth every 3 (three) hours as needed for moderate pain ((score 4 to 6)). (Patient not taking: Reported on 06/09/2018), Disp: 60 tablet, Rfl: 0 .   rOPINIRole (REQUIP) 1 MG tablet, Take 1 mg by mouth daily., Disp: , Rfl:  .  traMADol (ULTRAM) 50 MG tablet, Take 1-2 tablets (50-100 mg total) by mouth every 4 (four) hours as needed (mild pain). (Patient not taking: Reported on 03/09/2018), Disp: 30 tablet, Rfl: 0  ROS  Constitutional: Denies any fever or chills Gastrointestinal: No reported hemesis, hematochezia, vomiting, or acute GI distress Musculoskeletal: Denies any acute onset joint swelling, redness, loss of ROM, or weakness Neurological: No reported episodes of acute onset apraxia, aphasia, dysarthria, agnosia, amnesia, paralysis, loss of coordination, or loss of consciousness  Allergies  Mr. Yang has No  Known Allergies.  [place]  Drug: Mr. Kuennen  reports no history of drug use. Alcohol:  reports no history of alcohol use. Tobacco:  reports that he has never smoked. He has never used smokeless tobacco. Medical:  has a past medical history of Arthritis, Depression, Diabetes mellitus without complication ([place]), Difficult intubation, GERD (gastroesophageal reflux disease), Heart murmur, Hypertension, Macular degeneration, Neuropathy, OSA on CPAP, and Prostate cancer ([place]). Surgical: Mr. Tenbrink  has a past surgical history that includes Pain pump implantation; Cataract extraction w/ intraocular lens  implant, bilateral; Total shoulder replacement (Left); Rotator cuff repair (Left); Lumbar spine surgery; Mouth surgery; prostate cancer; Skin cancer excision; Replacement total knee (Right); and Total hip arthroplasty (Right, 04/14/2017). Family: family history is not on file.  Constitutional Exam  General appearance: Well nourished, well developed, and well hydrated. In no apparent acute distress Vitals:   06/09/18 0959  BP: (!) 152/55  Pulse: 84  Resp: 18  Temp: 98 F (36.7 C)  SpO2: 96%  Weight: 230 lb (104.3 kg)  Height: 6' (1.829 m)   BMI Assessment: Estimated body mass index is 31.19 kg/m as calculated from the  following:   Height as of this encounter: 6' (1.829 m).   Weight as of this encounter: 230 lb (104.3 kg).  BMI interpretation table: BMI level Category Range association with higher incidence of chronic pain  <18 kg/m2 Underweight   18.5-24.9 kg/m2 Ideal body weight   25-29.9 kg/m2 Overweight Increased incidence by 20%  30-34.9 kg/m2 Obese (Class I) Increased incidence by 68%  35-39.9 kg/m2 Severe obesity (Class II) Increased incidence by 136%  >40 kg/m2 Extreme obesity (Class III) Increased incidence by 254%   Patient's current BMI Ideal Body weight  Body mass index is 31.19 kg/m. Ideal body weight: 77.6 kg (171 lb 1.2 oz) Adjusted ideal body weight: 88.3 kg (194 lb 10.3 oz)   BMI Readings from Last 4 Encounters:  06/09/18 31.19 kg/m  05/06/18 30.79 kg/m  04/14/18 29.16 kg/m  03/09/18 31.19 kg/m   Wt Readings from Last 4 Encounters:  06/09/18 230 lb (104.3 kg)  05/06/18 227 lb (103 kg)  04/14/18 221 lb (100.2 kg)  03/09/18 230 lb (104.3 kg)  Psych/Mental status: Alert, oriented x 3 (person, place, & time)       Eyes: PERLA Respiratory: No evidence of acute respiratory distress  Cervical Spine Area Exam  Skin & Axial Inspection: No masses, redness, edema, swelling, or associated skin lesions Alignment: Symmetrical Functional ROM: Unrestricted ROM      Stability: No instability detected Muscle Tone/Strength: Functionally intact. No obvious neuro-muscular anomalies detected. Sensory (Neurological): Unimpaired Palpation: No palpable anomalies              Upper Extremity (UE) Exam    Side: Right upper extremity  Side: Left upper extremity  Skin & Extremity Inspection: Skin color, temperature, and hair growth are WNL. No peripheral edema or cyanosis. No masses, redness, swelling, asymmetry, or associated skin lesions. No contractures.  Skin & Extremity Inspection: Skin color, temperature, and hair growth are WNL. No peripheral edema or cyanosis. No masses, redness,  swelling, asymmetry, or associated skin lesions. No contractures.  Functional ROM: Unrestricted ROM          Functional ROM: Unrestricted ROM          Muscle Tone/Strength: Functionally intact. No obvious neuro-muscular anomalies detected.  Muscle Tone/Strength: Functionally intact. No obvious neuro-muscular anomalies detected.  Sensory (Neurological): Unimpaired          Sensory (  Neurological): Unimpaired          Palpation: No palpable anomalies              Palpation: No palpable anomalies              Provocative Test(s):  Phalen's test: deferred Tinel's test: deferred Apley's scratch test (touch opposite shoulder):  Action 1 (Across chest): deferred Action 2 (Overhead): deferred Action 3 (LB reach): deferred   Provocative Test(s):  Phalen's test: deferred Tinel's test: deferred Apley's scratch test (touch opposite shoulder):  Action 1 (Across chest): deferred Action 2 (Overhead): deferred Action 3 (LB reach): deferred    Thoracic Spine Area Exam  Skin & Axial Inspection: No masses, redness, or swelling Alignment: Symmetrical Functional ROM: Unrestricted ROM Stability: No instability detected Muscle Tone/Strength: Functionally intact. No obvious neuro-muscular anomalies detected. Sensory (Neurological): Unimpaired Muscle strength & Tone: No palpable anomalies  Lumbar Spine Area Exam  Skin & Axial Inspection: No masses, redness, or swelling Alignment: Symmetrical Functional ROM: Unrestricted ROM       Stability: No instability detected Muscle Tone/Strength: Functionally intact. No obvious neuro-muscular anomalies detected. Sensory (Neurological): Musculoskeletal pain pattern Palpation: Complains of area being tender to palpation lower sacral area       Provocative Tests: Hyperextension/rotation test: (+) due to pain. Lumbar quadrant test (Kemp's test): deferred today       Lateral bending test: (+) due to pain. Patrick's Maneuver: deferred today                   FABER*  test: deferred today                   S-I anterior distraction/compression test: deferred today         S-I lateral compression test: deferred today         S-I Thigh-thrust test: deferred today         S-I Gaenslen's test: deferred today         *(Flexion, ABduction and External Rotation)  Gait & Posture Assessment  Ambulation: Patient ambulates using a cane Gait: Limited. Using assistive device to ambulate Posture: Difficulty standing up straight, due to pain   Lower Extremity Exam    Side: Right lower extremity  Side: Left lower extremity  Stability: No instability observed          Stability: No instability observed          Skin & Extremity Inspection: Skin color, temperature, and hair growth are WNL. No peripheral edema or cyanosis. No masses, redness, swelling, asymmetry, or associated skin lesions. No contractures.  Skin & Extremity Inspection: Skin color, temperature, and hair growth are WNL. No peripheral edema or cyanosis. No masses, redness, swelling, asymmetry, or associated skin lesions. No contractures.  Functional ROM: Improved after treatment                  Functional ROM: Improved after treatment                  Muscle Tone/Strength: Functionally intact. No obvious neuro-muscular anomalies detected.  Muscle Tone/Strength: Functionally intact. No obvious neuro-muscular anomalies detected.  Sensory (Neurological): Improved        Sensory (Neurological): Improved        DTR: Patellar: deferred today Achilles: deferred today Plantar: deferred today  DTR: Patellar: deferred today Achilles: deferred today Plantar: deferred today  Palpation: No palpable anomalies  Palpation: No palpable anomalies   Assessment  Primary Diagnosis & Pertinent Problem  List: The primary encounter diagnosis was Fall, initial encounter. Diagnoses of Coccyx pain, Coccyodynia, Bilateral primary osteoarthritis of knee, and Chronic pain syndrome were also pertinent to this visit.  Status  Diagnosis  Not improving Persistent Persistent 1. Fall, initial encounter   2. Coccyx pain   3. Coccyodynia   4. Bilateral primary osteoarthritis of knee   5. Chronic pain syndrome      Patient follows up for postprocedural assessment status post bilateral genicular nerve RFA.  This was performed on 05/06/2018.  Patient endorses 100% pain relief of bilateral knees.  However last week the patient did sustain a fall on his lower back and sacral area while he was trying to get something in his basement.  He is endorsing severe pain in his sacrum and coccygeal area.  No lower extremity weakness.  Will obtain hip and pelvis x-rays to evaluate for fracture.  Given therapeutic success with bilateral genicular nerve RFA can set this procedure up as PRN for knee osteoarthritis.  Plan of Care   Lab-work, procedure(s), and/or referral(s): Orders Placed This Encounter  Procedures  . Radiofrequency,Genicular  . DG Si Joints  . DG HIP UNILAT W OR W/O PELVIS 2-3 VIEWS LEFT  . DG HIP UNILAT W OR W/O PELVIS 2-3 VIEWS RIGHT  . DG Pelvis 1-2 Views   PRN Procedures:   Bilateral genicular nerve radiofrequency ablation   Provider-requested follow-up: Return in about 3 months (around 09/08/2018) for Medication Management.  Time Note: Greater than 50% of the 25 minute(s) of face-to-face time spent with Mr. Brunty, was spent in counseling/coordination of care regarding: Mr. Delauder primary cause of pain, the treatment plan, the results, interpretation and significance of  his recent diagnostic interventional treatment(s) and realistic expectations.  No future appointments.  Primary Care Physician: Dion Body, MD Location: [facility] Outpatient Pain Management Facility Note by: Gillis Santa, M.D Date: 06/09/2018; Time: 10:29 AM  There are no Patient Instructions on file for this visit.

## 2018-06-09 NOTE — Progress Notes (Signed)
Nursing Pain Medication Assessment:  Safety precautions to be maintained throughout the outpatient stay will include: orient to surroundings, keep bed in low position, maintain call bell within reach at all times, provide assistance with transfer out of bed and ambulation.  Medication Inspection Compliance: Andrew Obrien did not comply with our request to bring his pills to be counted. He was reminded that bringing the medication bottles, even when empty, is a requirement.  Medication: None brought in. Pill/Patch Count: None available to be counted. Bottle Appearance: No container available. Did not bring bottle(s) to appointment. Filled Date: N/A Last Medication intake:  Ran out of medicine more than 48 hours ago

## 2018-07-01 ENCOUNTER — Emergency Department: Payer: No Typology Code available for payment source

## 2018-07-01 ENCOUNTER — Other Ambulatory Visit: Payer: Self-pay

## 2018-07-01 ENCOUNTER — Emergency Department
Admission: EM | Admit: 2018-07-01 | Discharge: 2018-07-01 | Disposition: A | Discharge status: Home / Self Care | Payer: No Typology Code available for payment source | Attending: Emergency Medicine | Admitting: Emergency Medicine

## 2018-07-01 DIAGNOSIS — Z7984 Long term (current) use of oral hypoglycemic drugs: Secondary | ICD-10-CM | POA: Diagnosis not present

## 2018-07-01 DIAGNOSIS — Y999 Unspecified external cause status: Secondary | ICD-10-CM | POA: Diagnosis not present

## 2018-07-01 DIAGNOSIS — I10 Essential (primary) hypertension: Secondary | ICD-10-CM | POA: Diagnosis not present

## 2018-07-01 DIAGNOSIS — Z8546 Personal history of malignant neoplasm of prostate: Secondary | ICD-10-CM | POA: Insufficient documentation

## 2018-07-01 DIAGNOSIS — Y9389 Activity, other specified: Secondary | ICD-10-CM | POA: Insufficient documentation

## 2018-07-01 DIAGNOSIS — M545 Low back pain: Secondary | ICD-10-CM | POA: Insufficient documentation

## 2018-07-01 DIAGNOSIS — M25552 Pain in left hip: Secondary | ICD-10-CM | POA: Diagnosis not present

## 2018-07-01 DIAGNOSIS — M5030 Other cervical disc degeneration, unspecified cervical region: Secondary | ICD-10-CM | POA: Diagnosis not present

## 2018-07-01 DIAGNOSIS — Y92410 Unspecified street and highway as the place of occurrence of the external cause: Secondary | ICD-10-CM | POA: Diagnosis not present

## 2018-07-01 DIAGNOSIS — Z79899 Other long term (current) drug therapy: Secondary | ICD-10-CM | POA: Diagnosis not present

## 2018-07-01 DIAGNOSIS — G319 Degenerative disease of nervous system, unspecified: Secondary | ICD-10-CM | POA: Insufficient documentation

## 2018-07-01 DIAGNOSIS — E119 Type 2 diabetes mellitus without complications: Secondary | ICD-10-CM | POA: Insufficient documentation

## 2018-07-01 DIAGNOSIS — Z96641 Presence of right artificial hip joint: Secondary | ICD-10-CM | POA: Insufficient documentation

## 2018-07-01 DIAGNOSIS — I6782 Cerebral ischemia: Secondary | ICD-10-CM | POA: Diagnosis not present

## 2018-07-01 DIAGNOSIS — S199XXA Unspecified injury of neck, initial encounter: Secondary | ICD-10-CM | POA: Diagnosis present

## 2018-07-01 DIAGNOSIS — S161XXA Strain of muscle, fascia and tendon at neck level, initial encounter: Secondary | ICD-10-CM | POA: Diagnosis not present

## 2018-07-01 DIAGNOSIS — Z96651 Presence of right artificial knee joint: Secondary | ICD-10-CM | POA: Insufficient documentation

## 2018-07-01 MED ORDER — OXYCODONE-ACETAMINOPHEN 5-325 MG PO TABS: 1.0000 | ORAL_TABLET | Freq: Four times a day (QID) | ORAL | 0 refills | Status: AC | PRN

## 2018-07-01 MED ORDER — ONDANSETRON 4 MG PO TBDP
4.0000 mg | ORAL_TABLET | Freq: Once | ORAL | Status: AC
  Administered 2018-07-01: 4 mg via ORAL
  Filled 2018-07-01: qty 1

## 2018-07-01 MED ORDER — TRAMADOL HCL 50 MG PO TABS
50.0000 mg | ORAL_TABLET | Freq: Once | ORAL | Status: AC
  Administered 2018-07-01: 50 mg via ORAL
  Filled 2018-07-01: qty 1

## 2018-07-01 NOTE — Discharge Instructions (Signed)
Return to the ER for new, worsening, or persistent pain, weakness or numbness, severe headaches, vomiting, or any other new or worsening symptoms that concern you.

## 2018-07-01 NOTE — ED Notes (Signed)
Patient report decrease in neck/ lower back pain. Sitting up in bed comfortably awaiting disposition. Wife at bedside. Safety maintained.
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
## 2018-08-25 DIAGNOSIS — L409 Psoriasis, unspecified: Secondary | ICD-10-CM | POA: Insufficient documentation

## 2018-09-08 ENCOUNTER — Encounter: Payer: Self-pay | Admitting: Student in an Organized Health Care Education/Training Program

## 2018-09-08 ENCOUNTER — Ambulatory Visit
Payer: Medicare Other | Attending: Student in an Organized Health Care Education/Training Program | Admitting: Student in an Organized Health Care Education/Training Program

## 2018-09-08 ENCOUNTER — Other Ambulatory Visit: Payer: Self-pay

## 2018-09-08 ENCOUNTER — Telehealth: Payer: Self-pay | Admitting: *Deleted

## 2018-09-08 VITALS — BP 126/62 | HR 105 | Temp 97.7°F | Resp 18 | Ht 69.0 in | Wt 230.0 lb

## 2018-09-08 DIAGNOSIS — Z96651 Presence of right artificial knee joint: Secondary | ICD-10-CM | POA: Insufficient documentation

## 2018-09-08 DIAGNOSIS — G8929 Other chronic pain: Secondary | ICD-10-CM | POA: Diagnosis present

## 2018-09-08 DIAGNOSIS — M170 Bilateral primary osteoarthritis of knee: Secondary | ICD-10-CM | POA: Diagnosis present

## 2018-09-08 DIAGNOSIS — Z9689 Presence of other specified functional implants: Secondary | ICD-10-CM | POA: Diagnosis present

## 2018-09-08 DIAGNOSIS — M792 Neuralgia and neuritis, unspecified: Secondary | ICD-10-CM | POA: Insufficient documentation

## 2018-09-08 DIAGNOSIS — M25562 Pain in left knee: Secondary | ICD-10-CM | POA: Insufficient documentation

## 2018-09-08 DIAGNOSIS — G894 Chronic pain syndrome: Secondary | ICD-10-CM | POA: Insufficient documentation

## 2018-09-08 DIAGNOSIS — M25561 Pain in right knee: Secondary | ICD-10-CM | POA: Diagnosis present

## 2018-09-08 DIAGNOSIS — M961 Postlaminectomy syndrome, not elsewhere classified: Secondary | ICD-10-CM

## 2018-09-08 NOTE — Progress Notes (Signed)
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 [place]  11/18/2018  8:45 AM Gillis Santa, MD [facility] None    Primary Care Physician: Dion Body, MD Location: [facility] Note by: Gillis Santa, M.D Date: 09/08/2018; Time: 2:35 PM  Patient Instructions   Follow up for bilateral genicular nerve RFA #2 with sedation after May 15   ____________________________________________________________________________________________  Preparing for Procedure with Sedation  Procedure appointments are limited to planned procedures: . No Prescription Refills. . No disability issues will be discussed. . No medication changes will be discussed.  Instructions: . Oral Intake: Do not eat or drink anything for at least 8 hours prior to your  procedure. . Transportation: Public transportation is not allowed. Bring an adult driver. The driver must be physically present in our waiting room before any procedure can be started. Marland Kitchen Physical Assistance: Bring an adult physically capable of assisting you, in the event you need help. This adult should keep you company at home for at least 6 hours after the procedure. . Blood Pressure Medicine: Take your blood pressure medicine with a sip of water the morning of the procedure. . Blood thinners: Notify our staff if you are taking any blood thinners. Depending on which one you take, there will be specific instructions on how and when to stop it. . Diabetics on insulin: Notify the staff so that you can be scheduled 1st case in the morning. If your diabetes requires high dose insulin, take only  of your normal insulin dose the morning of the procedure and notify the staff that you have done so. . Preventing infections: Shower with an antibacterial soap the morning of your procedure. . Build-up your immune system: Take 1000 mg of Vitamin C with every meal (3 times a day) the day prior to your procedure. Marland Kitchen Antibiotics: Inform the staff if you have a condition or reason that requires you to take antibiotics before dental procedures. . Pregnancy: If you are pregnant, call and cancel the procedure. . Sickness: If you have a cold, fever, or any active infections, call and cancel the procedure. . Arrival: You must be in the facility at least 30 minutes prior to your scheduled procedure. . Children: Do not bring children with you. . Dress appropriately: Bring dark clothing that you would not mind if they get stained. . Valuables: Do not bring any jewelry or valuables.  Reasons to call and reschedule or cancel your procedure: (Following these recommendations will minimize the risk of a serious complication.) . Surgeries: Avoid having procedures within 2 weeks of any surgery. (Avoid for 2 weeks before or after  any surgery). . Flu Shots: Avoid having procedures within 2 weeks of a flu  shots or . (Avoid for 2 weeks before or after immunizations). . Barium: Avoid having a procedure within 7-10 days after having had a radiological study involving the use of radiological contrast. (Myelograms, Barium swallow or enema study). . Heart attacks: Avoid any elective procedures or surgeries for the initial 6 months after a "Myocardial Infarction" (Heart Attack). . Blood thinners: It is imperative that you stop these medications before procedures. Let us know if you if you take any blood thinner.  . Infection: Avoid procedures during or within two weeks of an infection (including chest colds or gastrointestinal problems). Symptoms associated with infections include: Localized redness, fever, chills, night sweats or profuse sweating, burning sensation when voiding, cough, congestion, stuffiness, runny nose, sore throat, diarrhea, nausea, vomiting, cold or Flu symptoms, recent or current infections. It is specially important if the infection is over the area that we intend to treat. Marland Kitchen Heart and lung problems: Symptoms that may suggest an active cardiopulmonary problem include: cough, chest pain, breathing difficulties or shortness of breath, dizziness, ankle swelling, uncontrolled high or unusually low blood pressure, and/or palpitations. If you are experiencing any of these symptoms, cancel your procedure and contact your primary care physician for an evaluation.  Remember:  Regular Business hours are:  Monday to Thursday 8:00 AM to 4:00 PM  Provider's Schedule: Milinda Pointer, MD:  Procedure days: Tuesday and Thursday 7:30 AM to 4:00 PM  Gillis Santa, MD:  Procedure days: Monday and Wednesday 7:30 AM to 4:00 PM ____________________________________________________________________________________________  GENERAL RISKS AND COMPLICATIONS  What are the risk, side effects and possible complications? Generally  speaking, most procedures are safe.  However, with any procedure there are risks, side effects, and the possibility of complications.  The risks and complications are dependent upon the sites that are lesioned, or the type of nerve block to be performed.  The closer the procedure is to the spine, the more serious the risks are.  Great care is taken when placing the radio frequency needles, block needles or lesioning probes, but sometimes complications can occur. 1. Infection: Any time there is an injection through the skin, there is a risk of infection.  This is why sterile conditions are used for these blocks.  There are four possible types of infection. 1. Localized skin infection. 2. Central Nervous System Infection-This can be in the form of Meningitis, which can be deadly. 3. Epidural Infections-This can be in the form of an epidural abscess, which can cause pressure inside of the spine, causing compression of the spinal cord with subsequent paralysis. This would require an emergency surgery to decompress, and there are no guarantees that the patient would recover from the paralysis. 4. Discitis-This is an infection of the intervertebral discs.  It occurs in about 1% of discography procedures.  It is difficult to treat and it may lead to surgery.        2. Pain: the needles have to go through skin and soft tissues, will cause soreness.       3. Damage to internal structures:  The nerves to be lesioned may be near blood vessels or    other nerves which can be potentially damaged.       4. Bleeding: Bleeding is more common if the patient is taking blood thinners such as  aspirin, Coumadin, Ticiid, Plavix, etc., or if he/she have some genetic predisposition  such as hemophilia. Bleeding into the spinal canal can cause compression of the spinal  cord with subsequent paralysis.  This would  require an emergency surgery to  decompress and there are no guarantees that the patient would recover from the   paralysis.       5. Pneumothorax:  Puncturing of a lung is a possibility, every time a needle is introduced in  the area of the chest or upper back.  Pneumothorax refers to free air around the  collapsed lung(s), inside of the thoracic cavity (chest cavity).  Another two possible  complications related to a similar event would include: Hemothorax and Chylothorax.   These are variations of the Pneumothorax, where instead of air around the collapsed  lung(s), you may have blood or chyle, respectively.       6. Spinal headaches: They may occur with any procedures in the area of the spine.       7. Persistent CSF (Cerebro-Spinal Fluid) leakage: This is a rare problem, but may occur  with prolonged intrathecal or epidural catheters either due to the formation of a fistulous  track or a dural tear.       8. Nerve damage: By working so close to the spinal cord, there is always a possibility of  nerve damage, which could be as serious as a permanent spinal cord injury with  paralysis.       9. Death:  Although rare, severe deadly allergic reactions known as "Anaphylactic  reaction" can occur to any of the medications used.      10. Worsening of the symptoms:  We can always make thing worse.  What are the chances of something like this happening? Chances of any of this occuring are extremely low.  By statistics, you have more of a chance of getting killed in a motor vehicle accident: while driving to the hospital than any of the above occurring .  Nevertheless, you should be aware that they are possibilities.  In general, it is similar to taking a shower.  Everybody knows that you can slip, hit your head and get killed.  Does that mean that you should not shower again?  Nevertheless always keep in mind that statistics do not mean anything if you happen to be on the wrong side of them.  Even if a procedure has a 1 (one) in a 1,000,000 (million) chance of going wrong, it you happen to be that one..Also, keep in mind  that by statistics, you have more of a chance of having something go wrong when taking medications.  Who should not have this procedure? If you are on a blood thinning medication (e.g. Coumadin, Plavix, see list of "Blood Thinners"), or if you have an active infection going on, you should not have the procedure.  If you are taking any blood thinners, please inform your physician.  How should I prepare for this procedure?  Do not eat or drink anything at least six hours prior to the procedure.  Bring a driver with you .  It cannot be a taxi.  Come accompanied by an adult that can drive you back, and that is strong enough to help you if your legs get weak or numb from the local anesthetic.  Take all of your medicines the morning of the procedure with just enough water to swallow them.  If you have diabetes, make sure that you are scheduled to have your procedure done first thing in the morning, whenever possible.  If you have diabetes, take only half of your insulin dose and notify our nurse that you have done so as soon as you  arrive at the clinic.  If you are diabetic, but only take blood sugar pills (oral hypoglycemic), then do not take them on the morning of your procedure.  You may take them after you have had the procedure.  Do not take aspirin or any aspirin-containing medications, at least eleven (11) days prior to the procedure.  They may prolong bleeding.  Wear loose fitting clothing that may be easy to take off and that you would not mind if it got stained with Betadine or blood.  Do not wear any jewelry or perfume  Remove any nail coloring.  It will interfere with some of our monitoring equipment.  NOTE: Remember that this is not meant to be interpreted as a complete list of all possible complications.  Unforeseen problems may occur.  BLOOD THINNERS The following drugs contain aspirin or other products, which can cause increased bleeding during surgery and should not be  taken for 2 weeks prior to and 1 week after surgery.  If you should need take something for relief of minor pain, you may take acetaminophen which is found in Tylenol,m Datril, Anacin-3 and Panadol. It is not blood thinner. The products listed below are.  Do not take any of the products listed below in addition to any listed on your instruction sheet.  A.P.C or A.P.C with Codeine Codeine Phosphate Capsules #3 Ibuprofen Ridaura  ABC compound Congesprin Imuran rimadil  Advil Cope Indocin Robaxisal  Alka-Seltzer Effervescent Pain Reliever and Antacid Coricidin or Coricidin-D  Indomethacin Rufen  Alka-Seltzer plus Cold Medicine Cosprin Ketoprofen S-A-C Tablets  Anacin Analgesic Tablets or Capsules Coumadin Korlgesic Salflex  Anacin Extra Strength Analgesic tablets or capsules CP-2 Tablets Lanoril Salicylate  Anaprox Cuprimine Capsules Levenox Salocol  Anexsia-D Dalteparin Magan Salsalate  Anodynos Darvon compound Magnesium Salicylate Sine-off  Ansaid Dasin Capsules Magsal Sodium Salicylate  Anturane Depen Capsules Marnal Soma  APF Arthritis pain formula Dewitt's Pills Measurin Stanback  Argesic Dia-Gesic Meclofenamic Sulfinpyrazone  Arthritis Bayer Timed Release Aspirin Diclofenac Meclomen Sulindac  Arthritis pain formula Anacin Dicumarol Medipren Supac  Analgesic (Safety coated) Arthralgen Diffunasal Mefanamic Suprofen  Arthritis Strength Bufferin Dihydrocodeine Mepro Compound Suprol  Arthropan liquid Dopirydamole Methcarbomol with Aspirin Synalgos  ASA tablets/Enseals Disalcid Micrainin Tagament  Ascriptin Doan's Midol Talwin  Ascriptin A/D Dolene Mobidin Tanderil  Ascriptin Extra Strength Dolobid Moblgesic Ticlid  Ascriptin with Codeine Doloprin or Doloprin with Codeine Momentum Tolectin  Asperbuf Duoprin Mono-gesic Trendar  Aspergum Duradyne Motrin or Motrin IB Triminicin  Aspirin plain, buffered or enteric coated Durasal Myochrisine Trigesic  Aspirin Suppositories Easprin Nalfon  Trillsate  Aspirin with Codeine Ecotrin Regular or Extra Strength Naprosyn Uracel  Atromid-S Efficin Naproxen Ursinus  Auranofin Capsules Elmiron Neocylate Vanquish  Axotal Emagrin Norgesic Verin  Azathioprine Empirin or Empirin with Codeine Normiflo Vitamin E  Azolid Emprazil Nuprin Voltaren  Bayer Aspirin plain, buffered or children's or timed BC Tablets or powders Encaprin Orgaran Warfarin Sodium  Buff-a-Comp Enoxaparin Orudis Zorpin  Buff-a-Comp with Codeine Equegesic Os-Cal-Gesic   Buffaprin Excedrin plain, buffered or Extra Strength Oxalid   Bufferin Arthritis Strength Feldene Oxphenbutazone   Bufferin plain or Extra Strength Feldene Capsules Oxycodone with Aspirin   Bufferin with Codeine Fenoprofen Fenoprofen Pabalate or Pabalate-SF   Buffets II Flogesic Panagesic   Buffinol plain or Extra Strength Florinal or Florinal with Codeine Panwarfarin   Buf-Tabs Flurbiprofen Penicillamine   Butalbital Compound Four-way cold tablets Penicillin   Butazolidin Fragmin Pepto-Bismol   Carbenicillin Geminisyn Percodan   Carna Arthritis Reliever Geopen Persantine  Carprofen Gold's salt Persistin   Chloramphenicol Goody's Phenylbutazone   Chloromycetin Haltrain Piroxlcam   Clmetidine heparin Plaquenil   Cllnoril Hyco-pap Ponstel   Clofibrate Hydroxy chloroquine Propoxyphen         Before stopping any of these medications, be sure to consult the physician who ordered them.  Some, such as Coumadin (Warfarin) are ordered to prevent or treat serious conditions such as "deep thrombosis", "pumonary embolisms", and other heart problems.  The amount of time that you may need off of the medication may also vary with the medication and the reason for which you were taking it.  If you are taking any of these medications, please make sure you notify your pain physician before you undergo any procedures.         Radiofrequency Lesioning Radiofrequency lesioning is a procedure that is performed to  relieve pain. The procedure is often used for back, neck, or arm pain. Radiofrequency lesioning involves the use of a machine that creates radio waves to make heat. During the procedure, the heat is applied to the nerve that carries the pain signal. The heat damages the nerve and interferes with the pain signal. Pain relief usually starts about 2 weeks after the procedure and lasts for 6 months to 1 year. Tell a health care provider about:  Any allergies you have.  All medicines you are taking, including vitamins, herbs, eye drops, creams, and over-the-counter medicines.  Any problems you or family members have had with anesthetic medicines.  Any blood disorders you have.  Any surgeries you have had.  Any medical conditions you have.  Whether you are pregnant or may be pregnant. What are the risks? Generally, this is a safe procedure. However, problems may occur, including:  Pain or soreness at the injection site.  Infection at the injection site.  Damage to nerves or blood vessels. What happens before the procedure?  Ask your health care provider about: ? Changing or stopping your regular medicines. This is especially important if you are taking diabetes medicines or blood thinners. ? Taking medicines such as aspirin and ibuprofen. These medicines can thin your blood. Do not take these medicines before your procedure if your health care provider instructs you not to.  Follow instructions from your health care provider about eating or drinking restrictions.  Plan to have someone take you home after the procedure.  If you go home right after the procedure, plan to have someone with you for 24 hours. What happens during the procedure?  You will be given one or more of the following: ? A medicine to help you relax (sedative). ? A medicine to numb the area (local anesthetic).  You will be awake during the procedure. You will need to be able to talk with the health care provider  during the procedure.  With the help of a type of X-ray (fluoroscopy), the health care provider will insert a radiofrequency needle into the area to be treated.  Next, a wire that carries the radio waves (electrode) will be put through the radiofrequency needle. An electrical pulse will be sent through the electrode to verify the correct nerve. You will feel a tingling sensation, and you may have muscle twitching.  Then, the tissue that is around the needle tip will be heated by an electric current that is passed using the radiofrequency machine. This will numb the nerves.  A bandage (dressing) will be put on the insertion area after the procedure is done. The procedure may vary  among health care providers and hospitals. What happens after the procedure?  Your blood pressure, heart rate, breathing rate, and blood oxygen level will be monitored often until the medicines you were given have worn off.  Return to your normal activities as directed by your health care provider. This information is not intended to replace advice given to you by your health care provider. Make sure you discuss any questions you have with your health care provider. Document Released: 02/06/2011 Document Revised: 11/16/2015 Document Reviewed: 07/18/2014 Elsevier Interactive Patient Education  2019 [employer].

## 2018-09-08 NOTE — Patient Instructions (Addendum)
Follow up for bilateral genicular nerve RFA #2 with sedation after May 15   ____________________________________________________________________________________________  Preparing for Procedure with Sedation  Procedure appointments are limited to planned procedures: . No Prescription Refills. . No disability issues will be discussed. . No medication changes will be discussed.  Instructions: . Oral Intake: Do not eat or drink anything for at least 8 hours prior to your procedure. . Transportation: Public transportation is not allowed. Bring an adult driver. The driver must be physically present in our waiting room before any procedure can be started. Marland Kitchen Physical Assistance: Bring an adult physically capable of assisting you, in the event you need help. This adult should keep you company at home for at least 6 hours after the procedure. . Blood Pressure Medicine: Take your blood pressure medicine with a sip of water the morning of the procedure. . Blood thinners: Notify our staff if you are taking any blood thinners. Depending on which one you take, there will be specific instructions on how and when to stop it. . Diabetics on insulin: Notify the staff so that you can be scheduled 1st case in the morning. If your diabetes requires high dose insulin, take only  of your normal insulin dose the morning of the procedure and notify the staff that you have done so. . Preventing infections: Shower with an antibacterial soap the morning of your procedure. . Build-up your immune system: Take 1000 mg of Vitamin C with every meal (3 times a day) the day prior to your procedure. Marland Kitchen Antibiotics: Inform the staff if you have a condition or reason that requires you to take antibiotics before dental procedures. . Pregnancy: If you are pregnant, call and cancel the procedure. . Sickness: If you have a cold, fever, or any active infections, call and cancel the procedure. . Arrival: You must be in the facility at  least 30 minutes prior to your scheduled procedure. . Children: Do not bring children with you. . Dress appropriately: Bring dark clothing that you would not mind if they get stained. . Valuables: Do not bring any jewelry or valuables.  Reasons to call and reschedule or cancel your procedure: (Following these recommendations will minimize the risk of a serious complication.) . Surgeries: Avoid having procedures within 2 weeks of any surgery. (Avoid for 2 weeks before or after any surgery). . Flu Shots: Avoid having procedures within 2 weeks of a flu shots or . (Avoid for 2 weeks before or after immunizations). . Barium: Avoid having a procedure within 7-10 days after having had a radiological study involving the use of radiological contrast. (Myelograms, Barium swallow or enema study). . Heart attacks: Avoid any elective procedures or surgeries for the initial 6 months after a "Myocardial Infarction" (Heart Attack). . Blood thinners: It is imperative that you stop these medications before procedures. Let us know if you if you take any blood thinner.  . Infection: Avoid procedures during or within two weeks of an infection (including chest colds or gastrointestinal problems). Symptoms associated with infections include: Localized redness, fever, chills, night sweats or profuse sweating, burning sensation when voiding, cough, congestion, stuffiness, runny nose, sore throat, diarrhea, nausea, vomiting, cold or Flu symptoms, recent or current infections. It is specially important if the infection is over the area that we intend to treat. Marland Kitchen Heart and lung problems: Symptoms that may suggest an active cardiopulmonary problem include: cough, chest pain, breathing difficulties or shortness of breath, dizziness, ankle swelling, uncontrolled high or unusually low blood pressure,  and/or palpitations. If you are experiencing any of these symptoms, cancel your procedure and contact your primary care physician for an  evaluation.  Remember:  Regular Business hours are:  Monday to Thursday 8:00 AM to 4:00 PM  Provider's Schedule: Milinda Pointer, MD:  Procedure days: Tuesday and Thursday 7:30 AM to 4:00 PM  Gillis Santa, MD:  Procedure days: Monday and Wednesday 7:30 AM to 4:00 PM ____________________________________________________________________________________________  GENERAL RISKS AND COMPLICATIONS  What are the risk, side effects and possible complications? Generally speaking, most procedures are safe.  However, with any procedure there are risks, side effects, and the possibility of complications.  The risks and complications are dependent upon the sites that are lesioned, or the type of nerve block to be performed.  The closer the procedure is to the spine, the more serious the risks are.  Great care is taken when placing the radio frequency needles, block needles or lesioning probes, but sometimes complications can occur. 1. Infection: Any time there is an injection through the skin, there is a risk of infection.  This is why sterile conditions are used for these blocks.  There are four possible types of infection. 1. Localized skin infection. 2. Central Nervous System Infection-This can be in the form of Meningitis, which can be deadly. 3. Epidural Infections-This can be in the form of an epidural abscess, which can cause pressure inside of the spine, causing compression of the spinal cord with subsequent paralysis. This would require an emergency surgery to decompress, and there are no guarantees that the patient would recover from the paralysis. 4. Discitis-This is an infection of the intervertebral discs.  It occurs in about 1% of discography procedures.  It is difficult to treat and it may lead to surgery.        2. Pain: the needles have to go through skin and soft tissues, will cause soreness.       3. Damage to internal structures:  The nerves to be lesioned may be near blood vessels or     other nerves which can be potentially damaged.       4. Bleeding: Bleeding is more common if the patient is taking blood thinners such as  aspirin, Coumadin, Ticiid, Plavix, etc., or if he/she have some genetic predisposition  such as hemophilia. Bleeding into the spinal canal can cause compression of the spinal  cord with subsequent paralysis.  This would require an emergency surgery to  decompress and there are no guarantees that the patient would recover from the  paralysis.       5. Pneumothorax:  Puncturing of a lung is a possibility, every time a needle is introduced in  the area of the chest or upper back.  Pneumothorax refers to free air around the  collapsed lung(s), inside of the thoracic cavity (chest cavity).  Another two possible  complications related to a similar event would include: Hemothorax and Chylothorax.   These are variations of the Pneumothorax, where instead of air around the collapsed  lung(s), you may have blood or chyle, respectively.       6. Spinal headaches: They may occur with any procedures in the area of the spine.       7. Persistent CSF (Cerebro-Spinal Fluid) leakage: This is a rare problem, but may occur  with prolonged intrathecal or epidural catheters either due to the formation of a fistulous  track or a dural tear.       8. Nerve damage: By working so  close to the spinal cord, there is always a possibility of  nerve damage, which could be as serious as a permanent spinal cord injury with  paralysis.       9. Death:  Although rare, severe deadly allergic reactions known as "Anaphylactic  reaction" can occur to any of the medications used.      10. Worsening of the symptoms:  We can always make thing worse.  What are the chances of something like this happening? Chances of any of this occuring are extremely low.  By statistics, you have more of a chance of getting killed in a motor vehicle accident: while driving to the hospital than any of the above occurring .   Nevertheless, you should be aware that they are possibilities.  In general, it is similar to taking a shower.  Everybody knows that you can slip, hit your head and get killed.  Does that mean that you should not shower again?  Nevertheless always keep in mind that statistics do not mean anything if you happen to be on the wrong side of them.  Even if a procedure has a 1 (one) in a 1,000,000 (million) chance of going wrong, it you happen to be that one..Also, keep in mind that by statistics, you have more of a chance of having something go wrong when taking medications.  Who should not have this procedure? If you are on a blood thinning medication (e.g. Coumadin, Plavix, see list of "Blood Thinners"), or if you have an active infection going on, you should not have the procedure.  If you are taking any blood thinners, please inform your physician.  How should I prepare for this procedure?  Do not eat or drink anything at least six hours prior to the procedure.  Bring a driver with you .  It cannot be a taxi.  Come accompanied by an adult that can drive you back, and that is strong enough to help you if your legs get weak or numb from the local anesthetic.  Take all of your medicines the morning of the procedure with just enough water to swallow them.  If you have diabetes, make sure that you are scheduled to have your procedure done first thing in the morning, whenever possible.  If you have diabetes, take only half of your insulin dose and notify our nurse that you have done so as soon as you arrive at the clinic.  If you are diabetic, but only take blood sugar pills (oral hypoglycemic), then do not take them on the morning of your procedure.  You may take them after you have had the procedure.  Do not take aspirin or any aspirin-containing medications, at least eleven (11) days prior to the procedure.  They may prolong bleeding.  Wear loose fitting clothing that may be easy to take off and  that you would not mind if it got stained with Betadine or blood.  Do not wear any jewelry or perfume  Remove any nail coloring.  It will interfere with some of our monitoring equipment.  NOTE: Remember that this is not meant to be interpreted as a complete list of all possible complications.  Unforeseen problems may occur.  BLOOD THINNERS The following drugs contain aspirin or other products, which can cause increased bleeding during surgery and should not be taken for 2 weeks prior to and 1 week after surgery.  If you should need take something for relief of minor pain, you may take acetaminophen which  is found in Tylenol,m Datril, Anacin-3 and Panadol. It is not blood thinner. The products listed below are.  Do not take any of the products listed below in addition to any listed on your instruction sheet.  A.P.C or A.P.C with Codeine Codeine Phosphate Capsules #3 Ibuprofen Ridaura  ABC compound Congesprin Imuran rimadil  Advil Cope Indocin Robaxisal  Alka-Seltzer Effervescent Pain Reliever and Antacid Coricidin or Coricidin-D  Indomethacin Rufen  Alka-Seltzer plus Cold Medicine Cosprin Ketoprofen S-A-C Tablets  Anacin Analgesic Tablets or Capsules Coumadin Korlgesic Salflex  Anacin Extra Strength Analgesic tablets or capsules CP-2 Tablets Lanoril Salicylate  Anaprox Cuprimine Capsules Levenox Salocol  Anexsia-D Dalteparin Magan Salsalate  Anodynos Darvon compound Magnesium Salicylate Sine-off  Ansaid Dasin Capsules Magsal Sodium Salicylate  Anturane Depen Capsules Marnal Soma  APF Arthritis pain formula Dewitt's Pills Measurin Stanback  Argesic Dia-Gesic Meclofenamic Sulfinpyrazone  Arthritis Bayer Timed Release Aspirin Diclofenac Meclomen Sulindac  Arthritis pain formula Anacin Dicumarol Medipren Supac  Analgesic (Safety coated) Arthralgen Diffunasal Mefanamic Suprofen  Arthritis Strength Bufferin Dihydrocodeine Mepro Compound Suprol  Arthropan liquid Dopirydamole Methcarbomol with  Aspirin Synalgos  ASA tablets/Enseals Disalcid Micrainin Tagament  Ascriptin Doan's Midol Talwin  Ascriptin A/D Dolene Mobidin Tanderil  Ascriptin Extra Strength Dolobid Moblgesic Ticlid  Ascriptin with Codeine Doloprin or Doloprin with Codeine Momentum Tolectin  Asperbuf Duoprin Mono-gesic Trendar  Aspergum Duradyne Motrin or Motrin IB Triminicin  Aspirin plain, buffered or enteric coated Durasal Myochrisine Trigesic  Aspirin Suppositories Easprin Nalfon Trillsate  Aspirin with Codeine Ecotrin Regular or Extra Strength Naprosyn Uracel  Atromid-S Efficin Naproxen Ursinus  Auranofin Capsules Elmiron Neocylate Vanquish  Axotal Emagrin Norgesic Verin  Azathioprine Empirin or Empirin with Codeine Normiflo Vitamin E  Azolid Emprazil Nuprin Voltaren  Bayer Aspirin plain, buffered or children's or timed BC Tablets or powders Encaprin Orgaran Warfarin Sodium  Buff-a-Comp Enoxaparin Orudis Zorpin  Buff-a-Comp with Codeine Equegesic Os-Cal-Gesic   Buffaprin Excedrin plain, buffered or Extra Strength Oxalid   Bufferin Arthritis Strength Feldene Oxphenbutazone   Bufferin plain or Extra Strength Feldene Capsules Oxycodone with Aspirin   Bufferin with Codeine Fenoprofen Fenoprofen Pabalate or Pabalate-SF   Buffets II Flogesic Panagesic   Buffinol plain or Extra Strength Florinal or Florinal with Codeine Panwarfarin   Buf-Tabs Flurbiprofen Penicillamine   Butalbital Compound Four-way cold tablets Penicillin   Butazolidin Fragmin Pepto-Bismol   Carbenicillin Geminisyn Percodan   Carna Arthritis Reliever Geopen Persantine   Carprofen Gold's salt Persistin   Chloramphenicol Goody's Phenylbutazone   Chloromycetin Haltrain Piroxlcam   Clmetidine heparin Plaquenil   Cllnoril Hyco-pap Ponstel   Clofibrate Hydroxy chloroquine Propoxyphen         Before stopping any of these medications, be sure to consult the physician who ordered them.  Some, such as Coumadin (Warfarin) are ordered to prevent or  treat serious conditions such as "deep thrombosis", "pumonary embolisms", and other heart problems.  The amount of time that you may need off of the medication may also vary with the medication and the reason for which you were taking it.  If you are taking any of these medications, please make sure you notify your pain physician before you undergo any procedures.         Radiofrequency Lesioning Radiofrequency lesioning is a procedure that is performed to relieve pain. The procedure is often used for back, neck, or arm pain. Radiofrequency lesioning involves the use of a machine that creates radio waves to make heat. During the procedure, the heat is applied to the nerve that  carries the pain signal. The heat damages the nerve and interferes with the pain signal. Pain relief usually starts about 2 weeks after the procedure and lasts for 6 months to 1 year. Tell a health care provider about:  Any allergies you have.  All medicines you are taking, including vitamins, herbs, eye drops, creams, and over-the-counter medicines.  Any problems you or family members have had with anesthetic medicines.  Any blood disorders you have.  Any surgeries you have had.  Any medical conditions you have.  Whether you are pregnant or may be pregnant. What are the risks? Generally, this is a safe procedure. However, problems may occur, including:  Pain or soreness at the injection site.  Infection at the injection site.  Damage to nerves or blood vessels. What happens before the procedure?  Ask your health care provider about: ? Changing or stopping your regular medicines. This is especially important if you are taking diabetes medicines or blood thinners. ? Taking medicines such as aspirin and ibuprofen. These medicines can thin your blood. Do not take these medicines before your procedure if your health care provider instructs you not to.  Follow instructions from your health care provider about  eating or drinking restrictions.  Plan to have someone take you home after the procedure.  If you go home right after the procedure, plan to have someone with you for 24 hours. What happens during the procedure?  You will be given one or more of the following: ? A medicine to help you relax (sedative). ? A medicine to numb the area (local anesthetic).  You will be awake during the procedure. You will need to be able to talk with the health care provider during the procedure.  With the help of a type of X-ray (fluoroscopy), the health care provider will insert a radiofrequency needle into the area to be treated.  Next, a wire that carries the radio waves (electrode) will be put through the radiofrequency needle. An electrical pulse will be sent through the electrode to verify the correct nerve. You will feel a tingling sensation, and you may have muscle twitching.  Then, the tissue that is around the needle tip will be heated by an electric current that is passed using the radiofrequency machine. This will numb the nerves.  A bandage (dressing) will be put on the insertion area after the procedure is done. The procedure may vary among health care providers and hospitals. What happens after the procedure?  Your blood pressure, heart rate, breathing rate, and blood oxygen level will be monitored often until the medicines you were given have worn off.  Return to your normal activities as directed by your health care provider. This information is not intended to replace advice given to you by your health care provider. Make sure you discuss any questions you have with your health care provider. Document Released: 02/06/2011 Document Revised: 11/16/2015 Document Reviewed: 07/18/2014 [employer] Interactive Patient Education  2019 [employer].

## 2018-09-08 NOTE — Progress Notes (Signed)
Safety precautions to be maintained throughout the outpatient stay will include: orient to surroundings, keep bed in low position, maintain call bell within reach at all times, provide assistance with transfer out of bed and ambulation.  

## 2018-11-18 ENCOUNTER — Ambulatory Visit: Payer: Medicare Other | Admitting: Student in an Organized Health Care Education/Training Program

## 2018-12-08 ENCOUNTER — Other Ambulatory Visit: Payer: Self-pay | Admitting: Pain Medicine

## 2018-12-18 ENCOUNTER — Other Ambulatory Visit
Admission: RE | Admit: 2018-12-18 | Discharge: 2018-12-18 | Disposition: A | Discharge status: Home / Self Care | Payer: Medicare Other | Source: Ambulatory Visit | Attending: Student in an Organized Health Care Education/Training Program | Admitting: Student in an Organized Health Care Education/Training Program

## 2018-12-18 ENCOUNTER — Other Ambulatory Visit: Payer: Self-pay

## 2018-12-18 DIAGNOSIS — Z1159 Encounter for screening for other viral diseases: Secondary | ICD-10-CM | POA: Diagnosis present

## 2018-12-19 LAB — NOVEL CORONAVIRUS, NAA (HOSP ORDER, SEND-OUT TO REF LAB; TAT 18-24 HRS): SARS-CoV-2, NAA: NOT DETECTED

## 2018-12-23 ENCOUNTER — Encounter: Payer: Self-pay | Admitting: Student in an Organized Health Care Education/Training Program

## 2018-12-23 ENCOUNTER — Other Ambulatory Visit: Payer: Self-pay

## 2018-12-23 ENCOUNTER — Ambulatory Visit
Admission: RE | Admit: 2018-12-23 | Discharge: 2018-12-23 | Disposition: A | Discharge status: Home / Self Care | Payer: Medicare Other | Source: Ambulatory Visit | Attending: Student in an Organized Health Care Education/Training Program | Admitting: Student in an Organized Health Care Education/Training Program

## 2018-12-23 ENCOUNTER — Ambulatory Visit (HOSPITAL_BASED_OUTPATIENT_CLINIC_OR_DEPARTMENT_OTHER): Payer: Medicare Other | Admitting: Student in an Organized Health Care Education/Training Program

## 2018-12-23 VITALS — BP 124/64 | Temp 98.4°F | Resp 16 | Ht 72.0 in | Wt 230.0 lb

## 2018-12-23 DIAGNOSIS — M25562 Pain in left knee: Secondary | ICD-10-CM | POA: Diagnosis present

## 2018-12-23 DIAGNOSIS — G8929 Other chronic pain: Secondary | ICD-10-CM | POA: Diagnosis present

## 2018-12-23 DIAGNOSIS — M25561 Pain in right knee: Secondary | ICD-10-CM | POA: Diagnosis present

## 2018-12-23 DIAGNOSIS — Z96651 Presence of right artificial knee joint: Secondary | ICD-10-CM | POA: Diagnosis present

## 2018-12-23 DIAGNOSIS — M170 Bilateral primary osteoarthritis of knee: Secondary | ICD-10-CM | POA: Diagnosis present

## 2018-12-23 MED ORDER — ROPIVACAINE HCL 2 MG/ML IJ SOLN
1.0000 mL | Freq: Once | INTRAMUSCULAR | Status: AC
  Administered 2018-12-23: 1 mL via EPIDURAL
  Filled 2018-12-23: qty 10

## 2018-12-23 MED ORDER — LIDOCAINE HCL 2 % IJ SOLN
20.0000 mL | Freq: Once | INTRAMUSCULAR | Status: AC
  Administered 2018-12-23: 400 mg
  Filled 2018-12-23: qty 40

## 2018-12-23 MED ORDER — FENTANYL CITRATE (PF) 100 MCG/2ML IJ SOLN
INTRAMUSCULAR | Status: AC
  Filled 2018-12-23: qty 2

## 2018-12-23 MED ORDER — DEXAMETHASONE SODIUM PHOSPHATE 10 MG/ML IJ SOLN
10.0000 mg | Freq: Once | INTRAMUSCULAR | Status: AC
  Administered 2018-12-23: 10 mg
  Filled 2018-12-23: qty 1

## 2018-12-23 NOTE — Progress Notes (Signed)
Patient is patient's Name: Andrew Obrien  MRN: [id number]  Referring Provider: Dion Body, MD  DOB: Jun 04, 1935  PCP: Dion Body, MD  DOS: 12/23/2018  Note by: Gillis Santa, MD  Service setting: Ambulatory outpatient  Specialty: Interventional Pain Management  Patient type: Established  Location: [facility] (AMB) Pain Management Facility  Visit type: Interventional Procedure   Primary Reason for Visit: Interventional Pain Management Treatment. CC: Knee Pain (bilateral) and Back Pain (lower)  Procedure:          Anesthesia, Analgesia, Anxiolysis:  Type: Therapeutic Superior-lateral, Superior-medial, and Inferior-medial, Genicular Nerve Radiofrequency Ablation.  #2  (#1 done 04/2018) Region: Lateral, Anterior, and Medial aspects of the knee joint, above and below the knee joint proper. Level: Superior and inferior to the knee joint. Laterality: Bilateral  Type: Moderate (Conscious) Sedation combined with Local Anesthesia Indication(s): Analgesia and Anxiety Route: Intravenous (IV) IV Access: Secured Sedation: Meaningful verbal contact was maintained at all times during the procedure  Local Anesthetic: Lidocaine 1-2%  Position: Supine   Indications: 1. Bilateral primary osteoarthritis of knee   2. History of total right knee replacement (x2)   3. Chronic pain of both knees    Mr. Duran has been dealing with the above chronic pain for longer than three months and has either failed to respond, was unable to tolerate, or simply did not get enough benefit from other more conservative therapies including, but not limited to: 1. Over-the-counter medications 2. Anti-inflammatory medications 3. Muscle relaxants 4. Membrane stabilizers 5. Opioids 6. Physical therapy and/or chiropractic manipulation 7. Modalities (Heat, ice, etc.) 8. Invasive techniques such as nerve blocks. Mr. Klaus has attained more than 50% relief of the pain from a series of diagnostic injections conducted  in separate occasions.  Pain Score: Pre-procedure: 9 /10 Post-procedure: 0-No pain/10  Pre-op Assessment:  Mr. Magallon is a 84 y.o. (year old), male patient, seen today for interventional treatment. 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 Mr. Reaume has a current medication list which includes the following prescription(s): acetaminophen, allopurinol, allopurinol, azelastine, azelastine-fluticasone, azelastine-fluticasone, calcipotriene, calcium carb-cholecalciferol, calcium-vitamin d, cholecalciferol, duloxetine, enoxaparin, fluocinolone, fluticasone, glimepiride, glucosamine-chondroit-vit c-mn, melatonin, metformin, mirabegron er, multivitamin with minerals, NON FORMULARY, olmesartan-hydrochlorothiazide, omeprazole, potassium, pregabalin, ropinirole, ropinirole, rosuvastatin, tamsulosin, temazepam, trazodone, and vitamin c. His primarily concern today is the Knee Pain (bilateral) and Back Pain (lower)  Initial Vital Signs:  Pulse/HCG Rate:  ECG Heart Rate: 70 Temp: 98.4 F (36.9 C) Resp: 16 BP: (!) 142/61 SpO2: 96 %  BMI: Estimated body mass index is 31.19 kg/m as calculated from the following:   Height as of this encounter: 6' (1.829 m).   Weight as of this encounter: 230 lb (104.3 kg).  Risk Assessment: Allergies: Reviewed. He has No Known Allergies.  Allergy Precautions: None required Coagulopathies: Reviewed. None identified.  Blood-thinner therapy: None at this time Active Infection(s): Reviewed. None identified. Mr. Pryde is afebrile  Site Confirmation: Mr. Dancy was asked to confirm the procedure and laterality before marking the site Procedure checklist:  Completed Consent: Before the procedure and under the influence of no sedative(s), amnesic(s), or anxiolytics, the patient was informed of the treatment options, risks and possible complications. To fulfill our ethical and legal obligations, as recommended by the American Medical Association's Code of Ethics,  I have informed the patient of my clinical impression; the nature and purpose of the treatment or procedure; the risks, benefits, and possible complications of the intervention; the alternatives, including doing nothing; the risk(s) and benefit(s) of the alternative treatment(s) or procedure(s); and the risk(s) and benefit(s) of doing nothing. The patient was provided information about the general risks and possible complications associated with the procedure. These may include, but are not limited to: failure to achieve desired goals, infection, bleeding, organ or nerve damage, allergic reactions, paralysis, and death. In addition, the patient was informed of those risks and complications associated to the procedure, such as failure to decrease pain; infection; bleeding; organ or nerve damage with subsequent damage to sensory, motor, and/or autonomic systems, resulting in permanent pain, numbness, and/or weakness of one or several areas of the body; allergic reactions; (i.e.: anaphylactic reaction); and/or death. Furthermore, the patient was informed of those risks and complications associated with the medications. These include, but are not limited to: allergic reactions (i.e.: anaphylactic or anaphylactoid reaction(s)); adrenal axis suppression; blood sugar elevation that in diabetics may result in ketoacidosis or comma; water retention that in patients with history of congestive heart failure may result in shortness of breath, pulmonary edema, and decompensation with resultant heart failure; weight gain; swelling or edema; medication-induced neural toxicity; particulate matter embolism and blood vessel  occlusion with resultant organ, and/or nervous system infarction; and/or aseptic necrosis of one or more joints. Finally, the patient was informed that Medicine is not an exact science; therefore, there is also the possibility of unforeseen or unpredictable risks and/or possible complications that may result in a catastrophic outcome. The patient indicated having understood very clearly. We have given the patient no guarantees and we have made no promises. Enough time was given to the patient to ask questions, all of which were answered to the patient's satisfaction. Mr. Maker has indicated that he wanted to continue with the procedure. Attestation: I, the ordering provider, attest that I have discussed with the patient the benefits, risks, side-effects, alternatives, likelihood of achieving goals, and potential problems during recovery for the procedure that I have provided informed consent. Date  Time: 12/23/2018  9:19 AM  Pre-Procedure Preparation:  Monitoring: As per clinic protocol. Respiration, ETCO2, SpO2, BP, heart rate and rhythm monitor placed and checked for adequate function Safety Precautions: Patient was assessed for positional comfort and pressure points before starting the procedure. Time-out: I initiated and conducted the "Time-out" before starting the procedure, as per protocol. The patient was asked to participate by confirming the accuracy of the "Time Out" information. Verification of the correct person, site, and procedure were performed and confirmed by me, the nursing staff, and the patient. "Time-out" conducted as per Joint Commission's Universal Protocol (UP.01.01.01). Time: 1019  Description of Procedure:          Target Area: For Genicular Nerve block(s), the targets are: the superior-lateral genicular nerve, located in the lateral distal portion of the femoral shaft as it curves to form the lateral epicondyle, in the region of the distal femoral metaphysis; the  superior-medial genicular nerve, located in the medial distal portion of the femoral shaft as it curves to form the medial epicondyle; and the inferior-medial genicular nerve, located in the medial, proximal portion of the tibial shaft, as it curves to form the medial epicondyle, in the region of the proximal tibial metaphysis. Approach: Anterior, ipsilateral approach. Area Prepped: Entire knee area, from mid-thigh to mid-shin, lateral, anterior, and medial aspects. Prepping solution: Hibiclens (  4.0% Chlorhexidine gluconate solution) Safety Precautions: Aspiration looking for blood return was conducted prior to all injections. At no point did we inject any substances, as a needle was being advanced. No attempts were made at seeking any paresthesias. Safe injection practices and needle disposal techniques used. Medications properly checked for expiration dates. SDV (single dose vial) medications used. Description of the Procedure: Protocol guidelines were followed. The patient was placed in position over the procedure table. The target area was identified and the area prepped in the usual manner. The skin and muscle were infiltrated with local anesthetic. Appropriate amount of time allowed to pass for local anesthetics to take effect. Radiofrequency needles were introduced to the target area using fluoroscopic guidance. Using the NeuroTherm NT1100 Radiofrequency Generator, sensory stimulation using 50 Hz was used to locate & identify the nerve, making sure that the needle was positioned such that there was no sensory stimulation below 0.3 V or above 0.7 V. Stimulation using 2 Hz was used to evaluate the motor component. Care was taken not to lesion any nerves that demonstrated motor stimulation of the lower extremities at an output of less than 2.5 times that of the sensory threshold, or a maximum of 2.0 V. Once satisfactory placement of the needles was achieved, the numbing solution was slowly injected after  negative aspiration. After waiting for at least 2 minutes, the ablation was performed at 80 degrees C for 60 seconds, using regular Radiofrequency settings. Once the procedure was completed, the needles were then removed and the area cleansed, making sure to leave some of the prepping solution back to take advantage of its long term bactericidal properties. Intra-operative Compliance: Compliant Vitals:   12/23/18 1104 12/23/18 1114 12/23/18 1124 12/23/18 1133  BP: (!) 121/59 (!) 125/58 (!) 140/56 124/64  Resp: (!) 21 17 15 16   Temp:      SpO2: 95% 99% 96% 95%  Weight:      Height:        Start Time: 1019 hrs. End Time: 1100 hrs. Materials & Medications:  Needle(s) Type: Teflon-coated, curved tip, Radiofrequency needle(s) Gauge: 22G Length: 10cm Medication(s): Please see orders for medications and dosing details. 5 cc solution consisting of 4 cc of 0.2% ropivacaine, 1 cc of Decadron 10 mg/cc.  1.5 cc injected at each level for the left knee prior to lesioning 5 cc solution consisting of 4 cc of 0.2% ropivacaine, 1 cc of Decadron 10 mg/cc.  1.5 cc injected at each level for the right knee prior to lesioning Total steroid dose: 20 mg Decadron Imaging Guidance (Non-Spinal):          Type of Imaging Technique: Fluoroscopy Guidance (Non-Spinal) Indication(s): Assistance in needle guidance and placement for procedures requiring needle placement in or near specific anatomical locations not easily accessible without such assistance. Exposure Time: Please see nurses notes. Contrast: None used. Fluoroscopic Guidance: I was personally present during the use of fluoroscopy. "Tunnel Vision Technique" used to obtain the best possible view of the target area. Parallax error corrected before commencing the procedure. "Direction-depth-direction" technique used to introduce the needle under continuous pulsed fluoroscopy. Once target was reached, antero-posterior, oblique, and lateral fluoroscopic projection  used confirm needle placement in all planes. Images permanently stored in EMR. Interpretation: No contrast injected.  Antibiotic Prophylaxis:   Anti-infectives (From admission, onward)   None     Indication(s): None identified  Post-operative Assessment:  Post-procedure Vital Signs:  Pulse/HCG Rate:  71 Temp: 98.4 F (36.9 C) Resp: 16 BP: 124/64 SpO2:  95 %  EBL: None  Complications: No immediate post-treatment complications observed by team, or reported by patient.  Note: The patient tolerated the entire procedure well. A repeat set of vitals were taken after the procedure and the patient was kept under observation following institutional policy, for this type of procedure. Post-procedural neurological assessment was performed, showing return to baseline, prior to discharge. The patient was provided with post-procedure discharge instructions, including a section on how to identify potential problems. Should any problems arise concerning this procedure, the patient was given instructions to immediately contact us, at any time, without hesitation. In any case, we plan to contact the patient by telephone for a follow-up status report regarding this interventional procedure.  Comments:  No additional relevant information.  Plan of Care    Imaging Orders     DG PAIN CLINIC C-ARM 1-60 MIN NO REPORT Procedure Orders    No procedure(s) ordered today    Medications ordered for procedure: Meds ordered this encounter  Medications  . lidocaine (XYLOCAINE) 2 % (with pres) injection 400 mg  . dexamethasone (DECADRON) injection 10 mg  . ropivacaine (PF) 2 mg/mL (0.2%) (NAROPIN) injection 1 mL  . ropivacaine (PF) 2 mg/mL (0.2%) (NAROPIN) injection 1 mL  . dexamethasone (DECADRON) injection 10 mg   Medications administered: We administered lidocaine, dexamethasone, ropivacaine (PF) 2 mg/mL (0.2%), ropivacaine (PF) 2 mg/mL (0.2%), and dexamethasone.  See the medical record for exact  dosing, route, and time of administration.  Disposition: Discharge home  Discharge Date & Time: 12/23/2018; 1135 hrs.   Physician-requested Follow-up: Return in about 6 weeks (around 02/03/2019) for Post Procedure Evaluation, virtual.  Future Appointments  Date Time Provider [place]  02/04/2019 10:00 AM Gillis Santa, MD [facility] None   Primary Care Physician: Dion Body, MD Location: [facility] Note by: Gillis Santa, MD Date: 12/23/2018; Time: 3:39 PM  Disclaimer:  Medicine is not an exact science. The only guarantee in medicine is that nothing is guaranteed. It is important to note that the decision to proceed with this intervention was based on the information collected from the patient. The Data and conclusions were drawn from the patient's questionnaire, the interview, and the physical examination. Because the information was provided in large part by the patient, it cannot be guaranteed that it has not been purposely or unconsciously manipulated. Every effort has been made to obtain as much relevant data as possible for this evaluation. It is important to note that the conclusions that lead to this procedure are derived in large part from the available data. Always take into account that the treatment will also be dependent on availability of resources and existing treatment guidelines, considered by other Pain Management Practitioners as being common knowledge and practice, at the time of the intervention. For Medico-Legal purposes, it is also important to point out that variation in procedural techniques and pharmacological choices are the acceptable norm. The indications, contraindications, technique, and results of the above procedure should only be interpreted and judged by a Board-Certified Interventional Pain Specialist with extensive familiarity and expertise in the same exact procedure and technique.

## 2018-12-23 NOTE — Patient Instructions (Signed)

## 2018-12-23 NOTE — Progress Notes (Signed)
Safety precautions to be maintained throughout the outpatient stay will include: orient to surroundings, keep bed in low position, maintain call bell within reach at all times, provide assistance with transfer out of bed and ambulation.  

## 2018-12-24 ENCOUNTER — Telehealth: Payer: Self-pay

## 2018-12-24 NOTE — Telephone Encounter (Signed)
Patient was sleeping, called and spoke to his wife, he had a sleepless night but is doing well.

## 2018-12-28 DIAGNOSIS — N183 Chronic kidney disease, stage 3 unspecified: Secondary | ICD-10-CM | POA: Insufficient documentation

## 2019-02-04 ENCOUNTER — Encounter: Payer: Self-pay | Admitting: Student in an Organized Health Care Education/Training Program

## 2019-02-04 ENCOUNTER — Other Ambulatory Visit: Payer: Self-pay

## 2019-02-04 ENCOUNTER — Ambulatory Visit
Payer: Medicare Other | Attending: Student in an Organized Health Care Education/Training Program | Admitting: Student in an Organized Health Care Education/Training Program

## 2019-02-04 DIAGNOSIS — M792 Neuralgia and neuritis, unspecified: Secondary | ICD-10-CM | POA: Diagnosis not present

## 2019-02-04 DIAGNOSIS — G8929 Other chronic pain: Secondary | ICD-10-CM

## 2019-02-04 DIAGNOSIS — M170 Bilateral primary osteoarthritis of knee: Secondary | ICD-10-CM

## 2019-02-04 DIAGNOSIS — M25561 Pain in right knee: Secondary | ICD-10-CM

## 2019-02-04 DIAGNOSIS — M25562 Pain in left knee: Secondary | ICD-10-CM

## 2019-02-04 DIAGNOSIS — Z9689 Presence of other specified functional implants: Secondary | ICD-10-CM

## 2019-02-04 DIAGNOSIS — Z96651 Presence of right artificial knee joint: Secondary | ICD-10-CM | POA: Diagnosis not present

## 2019-02-04 DIAGNOSIS — M961 Postlaminectomy syndrome, not elsewhere classified: Secondary | ICD-10-CM

## 2019-02-04 DIAGNOSIS — G894 Chronic pain syndrome: Secondary | ICD-10-CM

## 2019-02-04 NOTE — Progress Notes (Signed)
Pain Management Virtual Encounter Note - Virtual Visit via Telephone Telehealth (real-time audio visits between healthcare provider and patient).   Patient's Phone No. & Preferred Pharmacy:  [phone] (home); There is no such number on file (mobile).; (Preferred) [phone] No e-mail address on record  [place], [place] [address] [place] [address] Phone: [phone] Fax: [phone]    Pre-screening note:  Our staff contacted Mr. Fabiano and offered him an "in person", "face-to-face" appointment versus a telephone encounter. He indicated preferring the telephone encounter, at this time.   Reason for Virtual Visit: COVID-19*  Social distancing based on CDC and AMA recommendations.   I contacted Gilford Raid on 02/04/2019 via telephone.      I clearly identified myself as Gillis Santa, MD. I verified that I was speaking with the correct person using two identifiers (Name: Kedrick Mcnamee, and date of birth: 20-Jan-1935).  Advanced Informed Consent I sought verbal advanced consent from Gilford Raid for virtual visit interactions. I informed Mr. Doren of possible security and privacy concerns, risks, and limitations associated with providing "not-in-person" medical evaluation and management services. I also informed Mr. Mian of the availability of "in-person" appointments. Finally, I informed him that there would be a charge for the virtual visit and that he could be  personally, fully or partially, financially responsible for it. Mr. Cubit expressed understanding and agreed to proceed.   Historic Elements   Mr. Ibrohim Simmers is a 84 y.o. year old, male patient evaluated today after his last encounter by our practice on 12/24/2018. Mr. Piet  has a past medical history of Arthritis, Depression, Diabetes mellitus without complication ([place]), Difficult intubation, GERD (gastroesophageal reflux disease),  Heart murmur, Hypertension, Macular degeneration, Neuropathy, OSA on CPAP, and Prostate cancer ([place]). He also  has a past surgical history that includes Pain pump implantation; Cataract extraction w/ intraocular lens  implant, bilateral; Total shoulder replacement (Left); Rotator cuff repair (Left); Lumbar spine surgery; Mouth surgery; prostate cancer; Skin cancer excision; Replacement total knee (Right); and Total hip arthroplasty (Right, 04/14/2017). Mr. Doughten has a current medication list which includes the following prescription(s): acetaminophen, allopurinol, allopurinol, azelastine, azelastine-fluticasone, azelastine-fluticasone, calcipotriene, calcium carb-cholecalciferol, calcium-vitamin d, cholecalciferol, duloxetine, enoxaparin, fluocinolone, fluticasone, glimepiride, glucosamine-chondroit-vit c-mn, melatonin, metformin, mirabegron er, multivitamin with minerals, NON FORMULARY, olmesartan-hydrochlorothiazide, omeprazole, potassium, pregabalin, ropinirole, ropinirole, rosuvastatin, tamsulosin, temazepam, trazodone, and vitamin c. He  reports that he has never smoked. He has never used smokeless tobacco. He reports that he does not drink alcohol or use drugs. Mr. Krogh has No Known Allergies.   HPI  Today, he is being contacted for a post-procedure assessment.  Evaluation of last interventional procedure  12/23/2018 Procedure: Bilateral Genicular Nerve RFA #2 (#1 done 04/2018) Pre-procedure pain score:  9/10 Post-procedure pain score: 0/10         Influential Factors: Intra-procedural challenges: None observed.         Reported side-effects: None.        Post-procedural adverse reactions or complications: None reported         Sedation: Please see nurses note for DOS. When no sedatives are used, the analgesic levels obtained are directly associated to the effectiveness of the local anesthetics. However, when sedation is provided, the level of analgesia obtained during the initial 1 hour  following the intervention, is believed to be the result of a combination of factors. These factors may include, but are not limited to: 1. The effectiveness of the local  anesthetics used. 2. The effects of the analgesic(s) and/or anxiolytic(s) used. 3. The degree of discomfort experienced by the patient at the time of the procedure. 4. The patients ability and reliability in recalling and recording the events. 5. The presence and influence of possible secondary gains and/or psychosocial factors. Reported result: Relief experienced during the 1st hour after the procedure: 100%   (Ultra-Short Term Relief)            Interpretative annotation: Clinically appropriate result. Analgesia during this period is likely to be Local Anesthetic and/or IV Sedative (Analgesic/Anxiolytic) related.          Effects of local anesthetic: The analgesic effects attained during this period are directly associated to the localized infiltration of local anesthetics and therefore cary significant diagnostic value as to the etiological location, or anatomical origin, of the pain. Expected duration of relief is directly dependent on the pharmacodynamics of the local anesthetic used. Long-acting (4-6 hours) anesthetics used.  Reported result: Relief during the next 4 to 6 hour after the procedure:100%   (Short-Term Relief)            Interpretative annotation: Clinically appropriate result. Analgesia during this period is likely to be Local Anesthetic-related.          Long-term benefit: Defined as the period of time past the expected duration of local anesthetics (1 hour for short-acting and 4-6 hours for long-acting). With the possible exception of prolonged sympathetic blockade from the local anesthetics, benefits during this period are typically attributed to, or associated with, other factors such as analgesic sensory neuropraxia, antiinflammatory effects, or beneficial biochemical changes provided by agents other than the  local anesthetics.  Reported result: Extended relief following procedure: 95%   (Long-Term Relief)            Interpretative annotation: Clinically appropriate result. Good relief. No permanent benefit expected. Inflammation plays a part in the etiology to the pain.           Laboratory Chemistry Profile (12 mo)  Renal: No results found for requested labs within last 8760 hours.  Lab Results  Component Value Date   GFRAA >60 04/16/2017   GFRNONAA 58 (L) 04/16/2017   Hepatic: No results found for requested labs within last 8760 hours. Lab Results  Component Value Date   AST 21 04/02/2017   ALT 22 04/02/2017   Other: No results found for requested labs within last 8760 hours. Note: Above Lab results reviewed.    Assessment  The primary encounter diagnosis was Bilateral primary osteoarthritis of knee. Diagnoses of History of total right knee replacement (x2), Chronic pain of both knees, Neuropathic pain, Failed back surgical syndrome, Spinal cord stimulator status, and Chronic pain syndrome were also pertinent to this visit.  Plan of Care  I am having Gilford Raid maintain his allopurinol, azelastine, calcipotriene, calcium-vitamin D, cholecalciferol, fluocinolone, fluticasone, Glucosamine-Chondroit-Vit C-Mn (GLUCOSAMINE CHONDR 1500 COMPLX PO), Melatonin, metFORMIN, multivitamin with minerals, mirabegron ER, olmesartan-hydrochlorothiazide, omeprazole, Potassium, pregabalin, rosuvastatin, temazepam, Calcium Carb-Cholecalciferol, vitamin C, NON FORMULARY, Azelastine-Fluticasone, tamsulosin, DULoxetine, rOPINIRole, enoxaparin, acetaminophen, glimepiride, traZODone, allopurinol, Azelastine-Fluticasone, and rOPINIRole.  Patient endorses significant pain relief after bilateral genicular nerve radiofrequency ablation of 2 performed on 12/23/2018.  He endorses improvement in his ability to walk and bear weight.  Endorsing approximately 90% pain relief.  Overall he is pleased with the results.   Patient will follow-up PRN.  Follow-up plan:   Return if symptoms worsen or fail to improve.   Bilateral Genicular Nerve RFA #2 on  12/23/2018,  (#1 done 04/2018) both very helpful for bilateral knee pain, can repeat in 6 months if return of pain then, otherwise follow up PRN Recent Visits Date Type Provider Dept  12/23/18 Procedure visit Gillis Santa, MD [facility]  Showing recent visits within past 90 days and meeting all other requirements   Today's Visits Date Type Provider Dept  02/04/19 Office Visit Gillis Santa, MD [facility]  Showing today's visits and meeting all other requirements   Future Appointments No visits were found meeting these conditions.  Showing future appointments within next 90 days and meeting all other requirements   I discussed the assessment and treatment plan with the patient. The patient was provided an opportunity to ask questions and all were answered. The patient agreed with the plan and demonstrated an understanding of the instructions.  Patient advised to call back or seek an in-person evaluation if the symptoms or condition worsens.  Total duration of non-face-to-face encounter: 15 minutes.  Note by: Gillis Santa, MD Date: 02/04/2019; Time: 11:59 AM  Note: This dictation was prepared with Dragon dictation. Any transcriptional errors that may result from this process are unintentional.  Disclaimer:  * Given the special circumstances of the COVID-19 pandemic, the federal government has announced that the Office for Civil Rights (OCR) will exercise its enforcement discretion and will not impose penalties on physicians using telehealth in the event of noncompliance with regulatory requirements under the [place] (HIPAA) in connection with the good faith provision of telehealth during the ZOXWR-60 national public health emergency. ([place])

## 2019-04-30 DIAGNOSIS — D638 Anemia in other chronic diseases classified elsewhere: Secondary | ICD-10-CM | POA: Insufficient documentation

## 2019-08-23 ENCOUNTER — Other Ambulatory Visit: Payer: Self-pay | Admitting: Physician Assistant

## 2019-08-23 ENCOUNTER — Other Ambulatory Visit: Payer: Self-pay

## 2019-08-23 ENCOUNTER — Ambulatory Visit
Admission: RE | Admit: 2019-08-23 | Discharge: 2019-08-23 | Disposition: A | Discharge status: Home / Self Care | Payer: Medicare Other | Source: Ambulatory Visit | Attending: Physician Assistant | Admitting: Physician Assistant

## 2019-08-23 DIAGNOSIS — R1032 Left lower quadrant pain: Secondary | ICD-10-CM | POA: Diagnosis not present

## 2019-08-23 MED ORDER — IOHEXOL 300 MG/ML  SOLN
100.0000 mL | Freq: Once | INTRAMUSCULAR | Status: AC | PRN
  Administered 2019-08-23: 100 mL via INTRAVENOUS

## 2019-08-30 DIAGNOSIS — R011 Cardiac murmur, unspecified: Secondary | ICD-10-CM | POA: Insufficient documentation

## 2019-09-16 ENCOUNTER — Ambulatory Visit: Payer: Medicare Other | Admitting: Dermatology

## 2019-10-18 ENCOUNTER — Other Ambulatory Visit: Payer: Self-pay | Admitting: *Deleted

## 2019-10-18 ENCOUNTER — Encounter: Payer: Self-pay | Admitting: *Deleted

## 2019-10-18 ENCOUNTER — Encounter: Payer: Self-pay | Admitting: Podiatry

## 2019-10-18 ENCOUNTER — Other Ambulatory Visit: Payer: Self-pay

## 2019-10-18 ENCOUNTER — Ambulatory Visit (INDEPENDENT_AMBULATORY_CARE_PROVIDER_SITE_OTHER): Payer: Medicare Other | Admitting: Podiatry

## 2019-10-18 DIAGNOSIS — M79674 Pain in right toe(s): Secondary | ICD-10-CM

## 2019-10-18 DIAGNOSIS — M79675 Pain in left toe(s): Secondary | ICD-10-CM | POA: Diagnosis not present

## 2019-10-18 DIAGNOSIS — E1142 Type 2 diabetes mellitus with diabetic polyneuropathy: Secondary | ICD-10-CM | POA: Diagnosis not present

## 2019-10-18 DIAGNOSIS — E1140 Type 2 diabetes mellitus with diabetic neuropathy, unspecified: Secondary | ICD-10-CM | POA: Insufficient documentation

## 2019-10-18 DIAGNOSIS — B351 Tinea unguium: Secondary | ICD-10-CM

## 2019-10-18 NOTE — Progress Notes (Signed)
This patient returns to my office for at risk foot care.  This patient requires this care by a professional since this patient will be at risk due to having diabetic neuropathy.  He has history of nerve surgery both feet which has left his feet numb. This patient is unable to cut nails himself since the patient cannot reach his nails.These nails are painful walking and wearing shoes.  This patient presents for at risk foot care today.  General Appearance  Alert, conversant and in no acute stress.  Vascular  Dorsalis pedis and posterior tibial  pulses are palpable  bilaterally.  Capillary return is within normal limits  bilaterally. Temperature is within normal limits  bilaterally.  Neurologic  Senn-Weinstein monofilament wire test absent  bilaterally. Muscle power within normal limits bilaterally.  Nails Thick disfigured discolored nails with subungual debris  Hallux nails  B/L. No evidence of bacterial infection or drainage bilaterally.  Orthopedic  No limitations of motion  feet .  No crepitus or effusions noted.  No bony pathology or digital deformities noted.  Skin  normotropic skin with no porokeratosis noted bilaterally.  No signs of infections or ulcers noted.     Onychomycosis  Pain in right toes  Pain in left toes  Consent was obtained for treatment procedures.   Mechanical debridement of nails 1-5  bilaterally performed with a nail nipper.  Filed with dremel without incident. Discuused his numb feet with this patient and he says he is interested im MBB.  He is scheduled to see Liliane Channel.   Return office visit  3 months.                   Told patient to return for periodic foot care and evaluation due to potential at risk complications.   Gardiner Barefoot DPM

## 2019-11-03 ENCOUNTER — Encounter: Payer: Self-pay | Admitting: Dermatology

## 2019-11-03 ENCOUNTER — Other Ambulatory Visit: Payer: Self-pay

## 2019-11-03 ENCOUNTER — Ambulatory Visit (INDEPENDENT_AMBULATORY_CARE_PROVIDER_SITE_OTHER): Payer: Medicare Other | Admitting: Dermatology

## 2019-11-03 DIAGNOSIS — L409 Psoriasis, unspecified: Secondary | ICD-10-CM

## 2019-11-03 DIAGNOSIS — L219 Seborrheic dermatitis, unspecified: Secondary | ICD-10-CM | POA: Diagnosis not present

## 2019-11-03 MED ORDER — CALCIPOTRIENE 0.005 % EX CREA: TOPICAL_CREAM | Freq: Every day | CUTANEOUS | 3 refills | Status: DC

## 2019-11-03 MED ORDER — CLOBETASOL PROP EMOLLIENT BASE 0.05 % EX CREA: 1.0000 "application " | TOPICAL_CREAM | CUTANEOUS | 2 refills | Status: DC

## 2019-11-03 NOTE — Patient Instructions (Signed)
Start [place] Nail   You will titrate up to 30mg  1 pill a day  Take 10mg  1 pill a day for 4 days, then Take 20mg  1 pill a day for 4 days, then Take 30mg  1 pill a day until you come back for follow up.

## 2019-11-03 NOTE — Progress Notes (Signed)
   Follow-Up Visit   Subjective  Andrew Obrien is a 85 y.o. 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   The following portions of the chart were reviewed this encounter and updated as appropriate:  Tobacco  Allergies  Meds  Problems  Med Hx  Surg Hx  Fam Hx      Review of Systems:  No other skin or systemic complaints except as noted in HPI or Assessment and Plan.  Objective  Well appearing patient in no apparent distress; mood and affect are within normal limits.  A focused examination was performed including hands, face. Relevant physical exam findings are noted in the Assessment and Plan.  Objective  bil hands and nails: Well-demarcated erythematous papules/plaques with silvery scale, guttate pink scaly papules, fingernail dystrophy  Images                 Assessment & Plan  Psoriasis-severe of the hands making it difficult to do activities of daily living bil hands and nails  Restart Otezla titrating up to 30mg  1 po qd, directions and samples x 2 given Lot [id number], exp 05/2021  Cont Clobetasol cr qd up to 5d/wk aa hands prn flares, avoid f/g/a Cont Calcipotriene cr qd aa hands prn flares  Clobetasol Prop Emollient Base (CLOBETASOL PROPIONATE E) 0.05 % emollient cream - bil hands and nails  calcipotriene (DOVONOX) 0.005 % cream - bil hands and nails  Seborrheic dermatitis/SEBO psoriasis face  Cont Ketoconazole 2% cr hs Monday, Wednesday, and Friday prn flares. Cont HC 2.5% lotion hs Tuesday, Thursday and Saturday prn flares  Return in about 1 month (around 12/04/2019) for TBSE, hx Melanoma R cheek, hx of multiple skin ca.   I, Othelia Pulling, RMA, am acting as scribe for Sarina Ser, MD .  Documentation: I have reviewed the above documentation for accuracy and completeness,  and I agree with the above.  Sarina Ser, MD

## 2019-11-11 ENCOUNTER — Encounter: Payer: Self-pay | Admitting: Dermatology

## 2019-11-15 ENCOUNTER — Other Ambulatory Visit: Payer: Self-pay

## 2019-11-15 DIAGNOSIS — L409 Psoriasis, unspecified: Secondary | ICD-10-CM

## 2019-11-15 MED ORDER — CLOBETASOL PROP EMOLLIENT BASE 0.05 % EX CREA: 1.0000 "application " | TOPICAL_CREAM | CUTANEOUS | 2 refills | Status: DC

## 2019-11-15 MED ORDER — HYDROCORTISONE 2.5 % EX LOTN: TOPICAL_LOTION | CUTANEOUS | 3 refills | Status: DC

## 2019-11-15 NOTE — Progress Notes (Signed)
Prescriptions changed from [facility] to [facility]

## 2019-11-24 ENCOUNTER — Other Ambulatory Visit: Payer: Self-pay

## 2019-11-24 ENCOUNTER — Ambulatory Visit: Payer: Medicare Other | Admitting: Orthotics

## 2019-11-24 DIAGNOSIS — M2041 Other hammer toe(s) (acquired), right foot: Secondary | ICD-10-CM

## 2019-11-24 NOTE — Progress Notes (Signed)
Patient has MBB from previous practice; we will send back to [place] for refurbishing, bill Medicare   Patient also measured for new DBS Apis 728 12 $E  Docs only,  Richy to fab

## 2019-12-16 ENCOUNTER — Other Ambulatory Visit: Payer: Self-pay

## 2019-12-16 ENCOUNTER — Ambulatory Visit (INDEPENDENT_AMBULATORY_CARE_PROVIDER_SITE_OTHER): Payer: Medicare Other | Admitting: Dermatology

## 2019-12-16 DIAGNOSIS — Z85820 Personal history of malignant melanoma of skin: Secondary | ICD-10-CM

## 2019-12-16 DIAGNOSIS — L219 Seborrheic dermatitis, unspecified: Secondary | ICD-10-CM | POA: Diagnosis not present

## 2019-12-16 DIAGNOSIS — Z1283 Encounter for screening for malignant neoplasm of skin: Secondary | ICD-10-CM

## 2019-12-16 DIAGNOSIS — D692 Other nonthrombocytopenic purpura: Secondary | ICD-10-CM

## 2019-12-16 DIAGNOSIS — L918 Other hypertrophic disorders of the skin: Secondary | ICD-10-CM

## 2019-12-16 DIAGNOSIS — L578 Other skin changes due to chronic exposure to nonionizing radiation: Secondary | ICD-10-CM

## 2019-12-16 DIAGNOSIS — Z85828 Personal history of other malignant neoplasm of skin: Secondary | ICD-10-CM

## 2019-12-16 DIAGNOSIS — D229 Melanocytic nevi, unspecified: Secondary | ICD-10-CM

## 2019-12-16 DIAGNOSIS — L820 Inflamed seborrheic keratosis: Secondary | ICD-10-CM | POA: Diagnosis not present

## 2019-12-16 DIAGNOSIS — L409 Psoriasis, unspecified: Secondary | ICD-10-CM

## 2019-12-16 DIAGNOSIS — L570 Actinic keratosis: Secondary | ICD-10-CM

## 2019-12-16 DIAGNOSIS — D1800 Hemangioma unspecified site: Secondary | ICD-10-CM

## 2019-12-16 DIAGNOSIS — L814 Other melanin hyperpigmentation: Secondary | ICD-10-CM

## 2019-12-16 DIAGNOSIS — L821 Other seborrheic keratosis: Secondary | ICD-10-CM

## 2019-12-16 MED ORDER — OTEZLA 30 MG PO TABS: 30.0000 mg | ORAL_TABLET | Freq: Two times a day (BID) | ORAL | 3 refills | Status: DC

## 2019-12-16 MED ORDER — OTEZLA 30 MG PO TABS: 30.0000 mg | ORAL_TABLET | Freq: Two times a day (BID) | ORAL | 12 refills | Status: DC

## 2019-12-16 NOTE — Progress Notes (Signed)
Follow-Up Visit   Subjective  Andrew Obrien is a 85 y.o. male who presents for the following: Annual Exam (History of Melanoma of right cheek and multiple skin cancers - TBSE today.) and Follow-up (Psoriasis follow up of hands. Not doing much better. He is taking [place] 30mg  1 po qd with no side effects.). The patient presents for Total-Body Skin Exam (TBSE) for skin cancer screening and mole check.  The following portions of the chart were reviewed this encounter and updated as appropriate:  Tobacco  Allergies  Meds  Problems  Med Hx  Surg Hx  Fam Hx      Review of Systems:  No other skin or systemic complaints except as noted in HPI or Assessment and Plan.  Objective  Well appearing patient in no apparent distress; mood and affect are within normal limits.  A full examination was performed including scalp, head, eyes, ears, nose, lips, neck, chest, axillae, abdomen, back, buttocks, bilateral upper extremities, bilateral lower extremities, hands, feet, fingers, toes, fingernails, and toenails. All findings within normal limits unless otherwise noted below.  Objective  Glabella: Pinkness  Objective  Left Glabella x 1, right ear x 1 (2): Erythematous thin papules/macules with gritty scale.   Objective  Scalp, hands (4): Erythematous keratotic or waxy stuck-on papule or plaque.   Objective  Bilateral hands: Pinkness, scale, peeling and fissures.  Objective  Right cheek: Well healed excision site.   Assessment & Plan    Lentigines - Scattered tan macules - Discussed due to sun exposure - Benign, observe - Call for any changes  Seborrheic Keratoses - Stuck-on, waxy, tan-brown papules and plaques  - Discussed benign etiology and prognosis. - Observe - Call for any changes  Melanocytic Nevi - Tan-brown and/or pink-flesh-colored symmetric macules and papules - Benign appearing on exam today - Observation - Call clinic for new or changing moles -  Recommend daily use of broad spectrum spf 30+ sunscreen to sun-exposed areas.   Hemangiomas - Red papules - Discussed benign nature - Observe - Call for any changes  Actinic Damage - diffuse scaly erythematous macules with underlying dyspigmentation - Recommend daily broad spectrum sunscreen SPF 30+ to sun-exposed areas, reapply every 2 hours as needed.  - Call for new or changing lesions.  Skin cancer screening performed today.  Acrochordons (Skin Tags) - Fleshy, skin-colored pedunculated papules - Benign appearing.  - Observe. - If desired, they can be removed with an in office procedure that is not covered by insurance. - Please call the clinic if you notice any new or changing lesions.  Purpura - Violaceous macules and patches - Benign - Related to age, sun damage and/or use of blood thinners - Observe - Can use OTC arnica containing moisturizer such as Dermend Bruise Formula if desired - Call for worsening or other concerns  History of Skin Cancer   Multiple - Clear. Observe for recurrence. Call clinic for new or changing lesions.  Recommend regular skin exams, daily broad-spectrum spf 30+ sunscreen use, and photoprotection.     Seborrheic dermatitis Glabella  Continue Ketoconazole cream qhs Monday, Wednesday, Friday and 2.5% HC lotion qhs Tuesday, Thursday, Saturday.  AK (actinic keratosis) (2) Left Glabella x 1, right ear x 1  Destruction of lesion - Left Glabella x 1, right ear x 1 Complexity: simple   Destruction method: cryotherapy   Informed consent: discussed and consent obtained   Timeout:  patient name, date of birth, surgical site, and procedure verified Lesion destroyed using liquid nitrogen:  Yes   Region frozen until ice ball extended beyond lesion: Yes   Outcome: patient tolerated procedure well with no complications   Post-procedure details: wound care instructions given    Inflamed seborrheic keratosis (4) Scalp, hands  Destruction of lesion  - Scalp, hands Complexity: simple   Destruction method: cryotherapy   Informed consent: discussed and consent obtained   Timeout:  patient name, date of birth, surgical site, and procedure verified Lesion destroyed using liquid nitrogen: Yes   Region frozen until ice ball extended beyond lesion: Yes   Outcome: patient tolerated procedure well with no complications   Post-procedure details: wound care instructions given    Psoriasis Bilateral hands  Increase Otezla 30mg  bid - samples packs given and patient instructed to titrate dose up over the next week.  calcipotriene (DOVONOX) 0.005 % cream - Bilateral hands  Clobetasol Prop Emollient Base (CLOBETASOL PROPIONATE E) 0.05 % emollient cream - Bilateral hands  Apremilast (OTEZLA) 30 MG TABS - Bilateral hands  Apremilast (OTEZLA) 30 MG TABS - Bilateral hands  History of melanoma Right cheek  Clear today. Observe   Skin cancer screening  Return in about 6 weeks (around 01/27/2020).   I, Ashok Cordia, CMA, am acting as scribe for Sarina Ser, MD .  Documentation: I have reviewed the above documentation for accuracy and completeness, and I agree with the above.  Sarina Ser, MD

## 2019-12-16 NOTE — Patient Instructions (Signed)

## 2019-12-23 ENCOUNTER — Encounter: Payer: Self-pay | Admitting: Dermatology

## 2020-01-05 ENCOUNTER — Other Ambulatory Visit: Payer: Self-pay

## 2020-01-05 ENCOUNTER — Ambulatory Visit (INDEPENDENT_AMBULATORY_CARE_PROVIDER_SITE_OTHER): Payer: Medicare Other | Admitting: Orthotics

## 2020-01-05 DIAGNOSIS — M2041 Other hammer toe(s) (acquired), right foot: Secondary | ICD-10-CM | POA: Diagnosis not present

## 2020-01-05 DIAGNOSIS — M2042 Other hammer toe(s) (acquired), left foot: Secondary | ICD-10-CM

## 2020-01-05 DIAGNOSIS — E1142 Type 2 diabetes mellitus with diabetic polyneuropathy: Secondary | ICD-10-CM | POA: Diagnosis not present

## 2020-01-05 NOTE — Progress Notes (Signed)
Patient picked up refurbished AFO brace as well as [id number] diabetic shoes.

## 2020-01-31 ENCOUNTER — Ambulatory Visit (INDEPENDENT_AMBULATORY_CARE_PROVIDER_SITE_OTHER): Payer: Medicare Other | Admitting: Dermatology

## 2020-01-31 ENCOUNTER — Other Ambulatory Visit: Payer: Self-pay

## 2020-01-31 DIAGNOSIS — L820 Inflamed seborrheic keratosis: Secondary | ICD-10-CM | POA: Diagnosis not present

## 2020-01-31 DIAGNOSIS — L570 Actinic keratosis: Secondary | ICD-10-CM

## 2020-01-31 DIAGNOSIS — L409 Psoriasis, unspecified: Secondary | ICD-10-CM

## 2020-01-31 DIAGNOSIS — Z85820 Personal history of malignant melanoma of skin: Secondary | ICD-10-CM | POA: Diagnosis not present

## 2020-01-31 NOTE — Progress Notes (Signed)
   Follow-Up Visit   Subjective  Andrew Obrien is a 85 y.o. male who presents for the following: Follow-up (AK follow up - left glabella, right ear LN2 x 2). Patient also with history of psoriasis not doing well on topicals.  He ran out of [place] and is currently not on it.  Also history of melanoma.  The following portions of the chart were reviewed this encounter and updated as appropriate:  Tobacco  Allergies  Meds  Problems  Med Hx  Surg Hx  Fam Hx      Review of Systems:  No other skin or systemic complaints except as noted in HPI or Assessment and Plan.  Objective  Well appearing patient in no apparent distress; mood and affect are within normal limits.  A focused examination was performed including scalp, face, hands. Relevant physical exam findings are noted in the Assessment and Plan.  Objective  Scalp x 1, right wrist x 3 (4): Erythematous keratotic or waxy stuck-on papule or plaque.   Objective  Right Ear: Erythematous thin papules/macules with gritty scale.   Objective  Hands: Erythema and scale of bilateral hands.  Images        Objective  Right Cheek: Well healed excision site   Assessment & Plan  Inflamed seborrheic keratosis (4) Scalp x 1, right wrist x 3  Destruction of lesion - Scalp x 1, right wrist x 3 Complexity: simple   Destruction method: cryotherapy   Informed consent: discussed and consent obtained   Timeout:  patient name, date of birth, surgical site, and procedure verified Lesion destroyed using liquid nitrogen: Yes   Region frozen until ice ball extended beyond lesion: Yes   Outcome: patient tolerated procedure well with no complications   Post-procedure details: wound care instructions given    AK (actinic keratosis) Right Ear  Destruction of lesion - Right Ear Complexity: simple   Destruction method: cryotherapy   Informed consent: discussed and consent obtained   Timeout:  patient name, date of birth, surgical  site, and procedure verified Lesion destroyed using liquid nitrogen: Yes   Region frozen until ice ball extended beyond lesion: Yes   Outcome: patient tolerated procedure well with no complications   Post-procedure details: wound care instructions given    Psoriasis, severe on systemic Otezla orally.  But flared after he ran out of [employer] calcipotriene (DOVONOX) 0.005 % cream - Hands  Apremilast (OTEZLA) 30 MG TABS - Hands  Apremilast (OTEZLA) 30 MG TABS - Hands  Other Related Medications Clobetasol Prop Emollient Base (CLOBETASOL PROPIONATE E) 0.05 % emollient cream  History of melanoma Right Cheek  Clear today. Observe.  Return in about 2 months (around 04/01/2020).  I, Ashok Cordia, CMA, am acting as scribe for Sarina Ser, MD .  Documentation: I have reviewed the above documentation for accuracy and completeness, and I agree with the above.  Sarina Ser, MD

## 2020-01-31 NOTE — Patient Instructions (Signed)

## 2020-02-03 ENCOUNTER — Encounter: Payer: Self-pay | Admitting: Dermatology

## 2020-04-03 ENCOUNTER — Ambulatory Visit: Payer: Medicare Other | Admitting: Dermatology

## 2020-04-17 ENCOUNTER — Encounter: Payer: Self-pay | Admitting: Student in an Organized Health Care Education/Training Program

## 2020-04-17 ENCOUNTER — Other Ambulatory Visit: Payer: Self-pay

## 2020-04-17 ENCOUNTER — Ambulatory Visit
Payer: Medicare Other | Attending: Student in an Organized Health Care Education/Training Program | Admitting: Student in an Organized Health Care Education/Training Program

## 2020-04-17 VITALS — BP 116/59 | HR 98 | Temp 98.1°F | Resp 16 | Ht 72.0 in | Wt 213.0 lb

## 2020-04-17 DIAGNOSIS — M79605 Pain in left leg: Secondary | ICD-10-CM | POA: Insufficient documentation

## 2020-04-17 DIAGNOSIS — I700 Atherosclerosis of aorta: Secondary | ICD-10-CM | POA: Insufficient documentation

## 2020-04-17 DIAGNOSIS — M25562 Pain in left knee: Secondary | ICD-10-CM | POA: Insufficient documentation

## 2020-04-17 DIAGNOSIS — M79604 Pain in right leg: Secondary | ICD-10-CM | POA: Diagnosis not present

## 2020-04-17 DIAGNOSIS — M792 Neuralgia and neuritis, unspecified: Secondary | ICD-10-CM | POA: Diagnosis not present

## 2020-04-17 DIAGNOSIS — M961 Postlaminectomy syndrome, not elsewhere classified: Secondary | ICD-10-CM

## 2020-04-17 DIAGNOSIS — Z96651 Presence of right artificial knee joint: Secondary | ICD-10-CM | POA: Diagnosis not present

## 2020-04-17 DIAGNOSIS — Y939 Activity, unspecified: Secondary | ICD-10-CM | POA: Diagnosis not present

## 2020-04-17 DIAGNOSIS — K5730 Diverticulosis of large intestine without perforation or abscess without bleeding: Secondary | ICD-10-CM | POA: Insufficient documentation

## 2020-04-17 DIAGNOSIS — Z9689 Presence of other specified functional implants: Secondary | ICD-10-CM | POA: Diagnosis not present

## 2020-04-17 DIAGNOSIS — G8929 Other chronic pain: Secondary | ICD-10-CM

## 2020-04-17 DIAGNOSIS — M170 Bilateral primary osteoarthritis of knee: Secondary | ICD-10-CM | POA: Diagnosis not present

## 2020-04-17 DIAGNOSIS — G894 Chronic pain syndrome: Secondary | ICD-10-CM | POA: Insufficient documentation

## 2020-04-17 DIAGNOSIS — W19XXXA Unspecified fall, initial encounter: Secondary | ICD-10-CM | POA: Insufficient documentation

## 2020-04-17 DIAGNOSIS — Y929 Unspecified place or not applicable: Secondary | ICD-10-CM | POA: Diagnosis not present

## 2020-04-17 DIAGNOSIS — Z79899 Other long term (current) drug therapy: Secondary | ICD-10-CM | POA: Insufficient documentation

## 2020-04-17 DIAGNOSIS — M25561 Pain in right knee: Secondary | ICD-10-CM | POA: Diagnosis not present

## 2020-04-17 DIAGNOSIS — K760 Fatty (change of) liver, not elsewhere classified: Secondary | ICD-10-CM | POA: Diagnosis not present

## 2020-04-17 MED ORDER — PREGABALIN 50 MG PO CAPS: ORAL_CAPSULE | ORAL | 0 refills | Status: DC

## 2020-04-17 MED ORDER — ALPHA-LIPOIC ACID 600 MG PO CAPS: 600.0000 mg | ORAL_CAPSULE | Freq: Every day | ORAL | 2 refills | Status: AC

## 2020-04-17 NOTE — Progress Notes (Signed)
Safety precautions to be maintained throughout the outpatient stay will include: orient to surroundings, keep bed in low position, maintain call bell within reach at all times, provide assistance with transfer out of bed and ambulation.  

## 2020-04-17 NOTE — Progress Notes (Signed)
PROVIDER NOTE: Information contained herein reflects review and annotations entered in association with encounter. Interpretation of such information and data should be left to medically-trained personnel. Information provided to patient can be located elsewhere in the medical record under "Patient Instructions". Document created using STT-dictation technology, any transcriptional errors that may result from process are unintentional.    Patient: Andrew Obrien  Service Category: E/M  Provider: Gillis Santa, MD  DOB: [birth year]  DOS: 04/17/2020  Specialty: Interventional Pain Management  MRN: [id number]  Setting: Ambulatory outpatient  PCP: Dion Body, MD  Type: Established Patient    Referring Provider: Dion Body, MD  Location: Office  Delivery: Face-to-face     HPI  Andrew Obrien, is here today because of his Neuropathic pain [M79.2]. Andrew Obrien primary complain today is Leg Pain (bilateral )  Pertinent problems: Andrew Obrien has Status post total replacement of hip; History of total right knee replacement (x2); Failed back surgical syndrome; Bilateral primary osteoarthritis of knee; Neuropathic pain; Spinal cord stimulator status; Pain syndrome, chronic; CKD (chronic kidney disease) stage 3, GFR 30-59 ml/min (HCC); and Diabetic neuropathy (HCC) on their pertinent problem list. Pain Assessment: Severity of Chronic pain is reported as a 8 /10. Location: Leg Right, Left/both legs and feet and possibly the hands, unsure if that is arthritis.. Onset: More than a month ago. Quality: Burning, Constant, Discomfort. Timing: Constant. Modifying factor(s): nothing currently. Vitals:  height is 6' (1.829 m) and weight is 213 lb (96.6 kg). His temporal temperature is 98.1 F (36.7 C). His blood pressure is 116/59 (abnormal) and his pulse is 98. His respiration is 16 and oxygen saturation is 98%.   Reason for encounter: worsening of previously known  (established) problem  Patient follows up for worsening pain in bilateral legs related to diabetic neuropathy as well as chronic lumbar radicular pain.  Of note patient was on Lyrica previously at a dose of 100 mg twice daily but he states that his primary care provider discontinued the medication.  Patient is unclear as to why this medication was discontinued as it was helping to manage his symptoms.  Recommend restarting Lyrica however at a lower dose and will titrate up slowly.  Also recommend starting alpha lipoic acid for diabetic neuropathy.  Patient states that his [place] spinal cord stimulator is not helping with his lower extremity neuropathic pain.  I will reach out to [place] representative to see if she can contact patient to optimize stimulator settings.  ROS  Constitutional: Denies any fever or chills Gastrointestinal: No reported hemesis, hematochezia, vomiting, or acute GI distress Musculoskeletal: Denies any acute onset joint swelling, redness, loss of ROM, or weakness Neurological: Numbness and tingling of bilateral lower extremity  Medication Review  Alpha-Lipoic Acid, Apremilast, Glucosamine-Chondroit-Vit C-Mn, NON FORMULARY, allopurinol, calcium carbonate, cholecalciferol, fenofibrate, glimepiride, ketoconazole, melatonin, metFORMIN, mirabegron ER, multivitamin with minerals, olmesartan-hydrochlorothiazide, omeprazole, pregabalin, rOPINIRole, rosuvastatin, tamsulosin, temazepam, traZODone, and vitamin C  History Review  Allergy: Andrew Obrien has No Known Allergies. Drug: Andrew Obrien  reports no history of drug use. Alcohol:  reports no history of alcohol use. Tobacco:  reports that he has never smoked. He has never used smokeless tobacco. Social: Andrew Obrien  reports that he has never smoked. He has never used smokeless tobacco. He reports that he does not drink alcohol and does not use drugs. Medical:  has a past medical history of Arthritis,  Depression, Diabetes mellitus without complication ([place]), Difficult intubation, GERD (gastroesophageal reflux disease),  Heart murmur, basal cell carcinoma (2001, 2002), basal cell carcinoma (2004), basal cell carcinoma (2005), basal cell carcinoma (2006), basal cell carcinoma (2008), basal cell carcinoma (2009), basal cell carcinoma (2015), basal cell carcinoma (2016), basal cell carcinoma (2017), basal cell carcinoma (2020), Hypertension, Macular degeneration, Melanoma ([place]) (1973), Neuropathy, OSA on CPAP, Prostate cancer ([place]), Psoriasis, and Squamous cell carcinoma of skin (2006). Surgical: Andrew Obrien  has a past surgical history that includes Pain pump implantation; Cataract extraction w/ intraocular lens  implant, bilateral; Total shoulder replacement (Left); Rotator cuff repair (Left); Lumbar spine surgery; Mouth surgery; prostate cancer; Skin cancer excision; Replacement total knee (Right); and Total hip arthroplasty (Right, 04/14/2017). Family: family history is not on file.  Laboratory Chemistry Profile   Renal Lab Results  Component Value Date   BUN 21 (H) 04/16/2017   CREATININE 1.15 04/16/2017   GFRAA >60 04/16/2017   GFRNONAA 58 (L) 04/16/2017     Hepatic Lab Results  Component Value Date   AST 21 04/02/2017   ALT 22 04/02/2017   ALBUMIN 4.0 04/02/2017   ALKPHOS 81 04/02/2017     Electrolytes Lab Results  Component Value Date   NA 141 04/16/2017   K 4.2 04/16/2017   CL 104 04/16/2017   CALCIUM 8.9 04/16/2017     Bone No results found for: VD25OH, VD125OH2TOT, ZO1096EA5, [id number], 25OHVITD1, 25OHVITD2, 25OHVITD3, TESTOFREE, TESTOSTERONE   Inflammation (CRP: Acute Phase) (ESR: Chronic Phase) Lab Results  Component Value Date   CRP 1.1 (H) 04/02/2017   ESRSEDRATE 25 (H) 04/02/2017       Note: Above Lab results reviewed.  Recent Imaging Review  CT ABDOMEN PELVIS W CONTRAST CLINICAL DATA:  85 year old Obrien with diffuse abdominal pain.  EXAM: CT ABDOMEN AND  PELVIS WITH CONTRAST  TECHNIQUE: Multidetector CT imaging of the abdomen and pelvis was performed using the standard protocol following bolus administration of intravenous contrast.  CONTRAST:  144m OMNIPAQUE IOHEXOL 300 MG/ML  SOLN  COMPARISON:  None.  FINDINGS: Lower chest: There are diffuse interstitial coarsening. The visualized lung bases are otherwise clear. There is coronary vascular calcification as well as calcification of the mitral annulus.  No intra-abdominal free air or free fluid.  Hepatobiliary: There is diffuse fatty infiltration of the liver. No intrahepatic biliary ductal dilatation. The gallbladder is unremarkable.  Pancreas: Unremarkable. No pancreatic ductal dilatation or surrounding inflammatory changes.  Spleen: Normal in size without focal abnormality.  Adrenals/Urinary Tract: The adrenal glands are unremarkable. The kidneys, visualized ureters, and urinary bladder appear unremarkable.  Stomach/Bowel: There is severe sigmoid diverticulosis without active inflammatory changes. There is moderate stool throughout the colon. There is no bowel obstruction or active inflammation. The appendix is normal.  Vascular/Lymphatic: There is advanced aortoiliac atherosclerotic disease. The IVC is unremarkable. No portal venous gas. There is no adenopathy.  Reproductive: The prostate and seminal vesicles are grossly unremarkable.  Other: None  Musculoskeletal: Osteopenia with severe degenerative changes of the spine. There is a total right hip arthroplasty. No acute osseous pathology. Stimulator with generator in the subcutaneous soft tissues of the lower back and wire extending into the lower thoracic spine.  IMPRESSION: 1. No acute intra-abdominal or pelvic pathology. 2. Sigmoid diverticulosis. No bowel obstruction. Normal appendix. 3. Fatty liver. 4. Aortic Atherosclerosis (ICD10-I70.0).  Electronically Signed   By: AAnner CreteM.D.   On:  08/23/2019 16:36 Note: Reviewed        Physical Exam  General appearance: Well nourished, well developed, and well hydrated. In no apparent acute distress  Mental status: Alert, oriented x 3 (person, place, & time)       Respiratory: No evidence of acute respiratory distress Eyes: PERLA Vitals: BP (!) 116/59 (BP Location: Right Arm, Patient Position: Sitting, Cuff Size: Normal)   Pulse 98   Temp 98.1 F (36.7 C) (Temporal)   Resp 16   Ht 6' (1.829 m)   Wt 213 lb (96.6 kg)   SpO2 98%   BMI 28.89 kg/m  BMI: Estimated body mass index is 28.89 kg/m as calculated from the following:   Height as of this encounter: 6' (1.829 m).   Weight as of this encounter: 213 lb (96.6 kg). Ideal: Ideal body weight: 77.6 kg (171 lb 1.2 oz) Adjusted ideal body weight: 85.2 kg (187 lb 13.5 oz)    Lumbar Spine Area Exam  Skin & Axial Inspection: No masses, redness, or swelling, spinal cord stimulator IPG present Alignment: Symmetrical Functional ROM: Unrestricted ROM       Stability: No instability detected Muscle Tone/Strength: Functionally intact. No obvious neuro-muscular anomalies detected. Sensory (Neurological): Musculoskeletal pain pattern Palpation: Complains of area being tender to palpation lower sacral area       Provocative Tests: Hyperextension/rotation test: (+) due to pain. Lumbar quadrant test (Kemp's test): deferred today       Lateral bending test: (+) due to pain. Patrick's Maneuver: deferred today                   FABER* test: deferred today                   S-I anterior distraction/compression test: deferred today         S-I lateral compression test: deferred today         S-I Thigh-thrust test: deferred today         S-I Gaenslen's test: deferred today         *(Flexion, ABduction and External Rotation)  Gait & Posture Assessment  Ambulation: Patient ambulates using a cane Gait: Limited. Using assistive device to ambulate Posture: Difficulty standing up straight,  due to pain   Lower Extremity Exam    Side: Right lower extremity  Side: Left lower extremity  Stability: No instability observed          Stability: No instability observed          Skin & Extremity Inspection: Skin color, temperature, and hair growth are WNL. No peripheral edema or cyanosis. No masses, redness, swelling, asymmetry, or associated skin lesions. No contractures.  Skin & Extremity Inspection: Skin color, temperature, and hair growth are WNL. No peripheral edema or cyanosis. No masses, redness, swelling, asymmetry, or associated skin lesions. No contractures.  Functional ROM: Improved after treatment                  Functional ROM: Improved after treatment                  Muscle Tone/Strength: Functionally intact. No obvious neuro-muscular anomalies detected.  Muscle Tone/Strength: Functionally intact. No obvious neuro-muscular anomalies detected.  Sensory (Neurological):  Neuropathic pain      Sensory (Neurological):  Neuropathic pain    DTR: Patellar: deferred today Achilles: deferred today Plantar: deferred today  DTR: Patellar: deferred today Achilles: deferred today Plantar: deferred today  Palpation: No palpable anomalies  Palpation: No palpable anomalies     Assessment   Status Diagnosis  Having a Flare-up Controlled Controlled 1. Neuropathic pain   2. Bilateral primary osteoarthritis of  knee   3. History of total right knee replacement (x2)   4. Chronic pain of both knees   5. Spinal cord stimulator status   6. Failed back surgical syndrome   7. Chronic pain syndrome   8. Fall, initial encounter      Updated Problems: Problem  Diabetic Neuropathy (Hcc)  CKD (chronic kidney disease) stage 3, GFR 30-59 ml/min (HCC)  History of total right knee replacement (x2)  Failed Back Surgical Syndrome  Bilateral Primary Osteoarthritis of Knee  Neuropathic Pain  Spinal Cord Stimulator Status  Pain Syndrome, Chronic  Status Post Total Replacement of  Hip    Plan of Care  Andrew Obrien has a current medication list which includes the following long-term medication(s): mirabegron er, temazepam, allopurinol, calcium carbonate, fenofibrate, glimepiride, metformin, olmesartan-hydrochlorothiazide, omeprazole, pregabalin, ropinirole, rosuvastatin, and trazodone.  Pharmacotherapy (Medications Ordered): Meds ordered this encounter  Medications  . pregabalin (LYRICA) 50 MG capsule    Sig: Take 1 capsule (50 mg total) by mouth at bedtime for 10 days, THEN 1 capsule (50 mg total) 2 (two) times daily.    Dispense:  70 capsule    Refill:  0    Fill one day early if pharmacy is closed on scheduled refill date. May substitute for generic if available.  . Alpha-Lipoic Acid 600 MG CAPS    Sig: Take 1 capsule (600 mg total) by mouth daily.    Dispense:  30 capsule    Refill:  2    Do not place medication on "Automatic Refill". Fill one day early if pharmacy is closed on scheduled refill date.    Follow-up plan:   Return in about 5 weeks (around 05/22/2020) for Medication Management, in person.    Recent Visits No visits were found meeting these conditions. Showing recent visits within past 90 days and meeting all other requirements Today's Visits Date Type Provider Dept  04/17/20 Office Visit Gillis Santa, MD [facility]  Showing today's visits and meeting all other requirements Future Appointments Date Type Provider Dept  05/22/20 Appointment Gillis Santa, MD [facility]  Showing future appointments within next 90 days and meeting all other requirements  I discussed the assessment and treatment plan with the patient. The patient was provided an opportunity to ask questions and all were answered. The patient agreed with the plan and demonstrated an understanding of the instructions.  Patient advised to call back or seek an in-person evaluation if the symptoms or condition worsens.  Duration of encounter:  65mnutes.  Note by: BGillis Santa MD Date: 04/17/2020; Time: 2:29 PM

## 2020-05-08 ENCOUNTER — Other Ambulatory Visit: Payer: Self-pay

## 2020-05-08 DIAGNOSIS — L409 Psoriasis, unspecified: Secondary | ICD-10-CM

## 2020-05-08 MED ORDER — OTEZLA 30 MG PO TABS: 30.0000 mg | ORAL_TABLET | Freq: Two times a day (BID) | ORAL | 6 refills | Status: DC

## 2020-05-08 NOTE — Progress Notes (Signed)
rx refill

## 2020-05-21 ENCOUNTER — Other Ambulatory Visit: Payer: Self-pay

## 2020-05-21 ENCOUNTER — Emergency Department: Payer: Medicare Other

## 2020-05-21 ENCOUNTER — Emergency Department
Admission: EM | Admit: 2020-05-21 | Discharge: 2020-05-21 | Disposition: A | Discharge status: Home / Self Care | Payer: Medicare Other | Source: Home / Self Care | Attending: Emergency Medicine | Admitting: Emergency Medicine

## 2020-05-21 DIAGNOSIS — I129 Hypertensive chronic kidney disease with stage 1 through stage 4 chronic kidney disease, or unspecified chronic kidney disease: Secondary | ICD-10-CM | POA: Insufficient documentation

## 2020-05-21 DIAGNOSIS — Z79899 Other long term (current) drug therapy: Secondary | ICD-10-CM | POA: Insufficient documentation

## 2020-05-21 DIAGNOSIS — Z85828 Personal history of other malignant neoplasm of skin: Secondary | ICD-10-CM | POA: Insufficient documentation

## 2020-05-21 DIAGNOSIS — R531 Weakness: Secondary | ICD-10-CM

## 2020-05-21 DIAGNOSIS — Z96659 Presence of unspecified artificial knee joint: Secondary | ICD-10-CM | POA: Insufficient documentation

## 2020-05-21 DIAGNOSIS — E860 Dehydration: Secondary | ICD-10-CM | POA: Insufficient documentation

## 2020-05-21 DIAGNOSIS — Y9301 Activity, walking, marching and hiking: Secondary | ICD-10-CM | POA: Insufficient documentation

## 2020-05-21 DIAGNOSIS — Z7984 Long term (current) use of oral hypoglycemic drugs: Secondary | ICD-10-CM | POA: Insufficient documentation

## 2020-05-21 DIAGNOSIS — N179 Acute kidney failure, unspecified: Secondary | ICD-10-CM | POA: Insufficient documentation

## 2020-05-21 DIAGNOSIS — N183 Chronic kidney disease, stage 3 unspecified: Secondary | ICD-10-CM | POA: Insufficient documentation

## 2020-05-21 DIAGNOSIS — E1140 Type 2 diabetes mellitus with diabetic neuropathy, unspecified: Secondary | ICD-10-CM | POA: Insufficient documentation

## 2020-05-21 DIAGNOSIS — W1839XA Other fall on same level, initial encounter: Secondary | ICD-10-CM | POA: Insufficient documentation

## 2020-05-21 DIAGNOSIS — E11649 Type 2 diabetes mellitus with hypoglycemia without coma: Secondary | ICD-10-CM | POA: Diagnosis not present

## 2020-05-21 DIAGNOSIS — W19XXXA Unspecified fall, initial encounter: Secondary | ICD-10-CM

## 2020-05-21 LAB — COMPREHENSIVE METABOLIC PANEL
ALT: 20 U/L (ref 0–44)
AST: 23 U/L (ref 15–41)
Albumin: 3.7 g/dL (ref 3.5–5.0)
Alkaline Phosphatase: 34 U/L — ABNORMAL LOW (ref 38–126)
Anion gap: 8 (ref 5–15)
BUN: 35 mg/dL — ABNORMAL HIGH (ref 8–23)
CO2: 23 mmol/L (ref 22–32)
Calcium: 9.1 mg/dL (ref 8.9–10.3)
Chloride: 109 mmol/L (ref 98–111)
Creatinine, Ser: 1.71 mg/dL — ABNORMAL HIGH (ref 0.61–1.24)
GFR, Estimated: 39 mL/min — ABNORMAL LOW (ref >60)
Glucose, Bld: 52 mg/dL — ABNORMAL LOW (ref 70–99)
Potassium: 4 mmol/L (ref 3.5–5.1)
Sodium: 140 mmol/L (ref 135–145)
Total Bilirubin: 0.6 mg/dL (ref 0.3–1.2)
Total Protein: 6.6 g/dL (ref 6.5–8.1)

## 2020-05-21 LAB — CBC
HCT: 34.3 % — ABNORMAL LOW (ref 39.0–52.0)
Hemoglobin: 11.1 g/dL — ABNORMAL LOW (ref 13.0–17.0)
MCH: 30.9 pg (ref 26.0–34.0)
MCHC: 32.4 g/dL (ref 30.0–36.0)
MCV: 95.5 fL (ref 80.0–100.0)
Platelets: 279 10*3/uL (ref 150–400)
RBC: 3.59 MIL/uL — ABNORMAL LOW (ref 4.22–5.81)
RDW: 13.2 % (ref 11.5–15.5)
WBC: 8.3 10*3/uL (ref 4.0–10.5)
nRBC: 0 % (ref 0.0–0.2)

## 2020-05-21 LAB — CBG MONITORING, ED: Glucose-Capillary: 119 mg/dL — ABNORMAL HIGH (ref 70–99)

## 2020-05-21 MED ORDER — LACTATED RINGERS IV BOLUS
1000.0000 mL | Freq: Once | INTRAVENOUS | Status: AC
  Administered 2020-05-21: 1000 mL via INTRAVENOUS

## 2020-05-21 MED ORDER — LIDOCAINE 5 % EX PTCH
1.0000 | MEDICATED_PATCH | CUTANEOUS | Status: DC
  Administered 2020-05-21: 1 via TRANSDERMAL
  Filled 2020-05-21: qty 1

## 2020-05-21 MED ORDER — ACETAMINOPHEN 500 MG PO TABS
1000.0000 mg | ORAL_TABLET | Freq: Once | ORAL | Status: AC
  Administered 2020-05-21: 1000 mg via ORAL
  Filled 2020-05-21: qty 2

## 2020-05-21 NOTE — ED Provider Notes (Signed)
[facility] Emergency Department Provider Note ____________________________________________   First MD Initiated Contact with Patient 05/21/20 1509     (approximate)  I have reviewed the triage vital signs and the nursing notes.  HISTORY  Chief Complaint Fall and Irregular Heart Beat   HPI Andrew Obrien is a 85 y.o. malewho presents to the ED for evaluation of a fall.   Chart review indicates history of HTN, HLD, DM on multiple oral agents.  OSA on CPAP.  Obesity.  History of pulmonary fibrosis with chronic DOE.  Patient reports ambulating with a cane at his baseline.  He denies recent illnesses.  He reports typically taking melatonin and another sleep aid regularly at night.  He reports getting another sleep medication over-the-counter that he bought at a local pharmacy to assist with his sleep, use this medication for the first time last night in addition to his other 2 medications.  Reports sleeping really well, but waking up groggy this morning and feeling a sensation of generalized weakness.  He reports eating breakfast with his wife and tolerating this without difficulty.  He reports then going to walk with his dog, which is typical for him, but reports his dog "was moving faster than I could."  He reports being pulled forward and catching himself on the ground without discrete injury.  He reports feeling some vague aching pain to his left neck after the injury, he denies any discrete head or neck injury.  He reports having difficulty getting himself up, so he called 911 for assistance who transports him here.  Of note, CBG noted to be 65 with EMS and he was provided a tube of oral glucose.  Patient reports taking his typical daily medications this morning.  He reports a sensation of dry mouth and thirst, but currently has no complaints otherwise.   Past Medical History:  Diagnosis Date  . Arthritis   . Depression   . Diabetes mellitus without  complication ([place])   . Difficult intubation   . GERD (gastroesophageal reflux disease)   . Heart murmur   . Hx of basal cell carcinoma 2001, 2002   R cheek txted in [place]  . Hx of basal cell carcinoma 2004   L cheek txted in [place]  . Hx of basal cell carcinoma 2005   Forehead txted in [place]  . Hx of basal cell carcinoma 2006   Back, txted in [place]  . Hx of basal cell carcinoma 2008   R post shoulder txted in [place]  . Hx of basal cell carcinoma 2009   Face and Back, L neck txted in [place]  . Hx of basal cell carcinoma 2015   L temple, R ear x 2 txted by Dr. Blane Ohara [place]  . Hx of basal cell carcinoma 2016   Back txted by Dr. Rayburn Felt [place]  . Hx of basal cell carcinoma 2017   L face x 2 txted by Dr. Blane Ohara [place]  . Hx of basal cell carcinoma 2020   Multiple face, back, shoulders txted by Dr. Evorn Gong  . Hypertension   . Macular degeneration    Right  . Melanoma ([place]) 1973   R cheek txted in [place]  . Neuropathy   . OSA on CPAP   . Prostate cancer ([place])   . Psoriasis   . Squamous cell carcinoma of skin 2006   SCCIS back txted in [place]    Patient Active Problem List   Diagnosis Date Noted  .  Pain due to onychomycosis of toenails of both feet 10/18/2019  . Diabetic neuropathy ([place]) 10/18/2019  . Systolic murmur 64/68/0321  . Anemia of chronic disease 04/30/2019  . CKD (chronic kidney disease) stage 3, GFR 30-59 ml/min (HCC) 12/28/2018  . Psoriasis 08/25/2018  . Personal history of gout 04/13/2018  . History of total right knee replacement (x2) 01/06/2018  . 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 Bilateral primary osteoarthritis of knee 01/06/2018  . Neuropathic pain 01/06/2018  . Spinal cord stimulator status 01/06/2018  . Pain syndrome, chronic 01/06/2018  . Right leg numbness 12/10/2017  . Right leg weakness 12/10/2017  . DNR (do not resuscitate) 11/28/2017  . Encounter for general adult medical examination without abnormal findings 11/28/2017  . Acquired trigger  finger of left ring finger 10/28/2017  . Status post total replacement of hip 04/14/2017  . Mixed hyperlipidemia 03/03/2017    Past Surgical History:  Procedure Laterality Date  . CATARACT EXTRACTION W/ INTRAOCULAR LENS  IMPLANT, BILATERAL    . LUMBAR SPINE SURGERY    . MOUTH SURGERY    . PAIN PUMP IMPLANTATION    . prostate cancer    . REPLACEMENT TOTAL KNEE Right    x 2  . ROTATOR CUFF REPAIR Left   . SKIN CANCER EXCISION    . TOTAL HIP ARTHROPLASTY Right 04/14/2017   Procedure: TOTAL HIP ARTHROPLASTY;  Surgeon: Dereck Leep, MD;  Location: [facility] ORS;  Service: Orthopedics;  Laterality: Right;  . TOTAL SHOULDER REPLACEMENT Left     Prior to Admission medications   Medication Sig Start Date End Date Taking? Authorizing Provider  allopurinol (ZYLOPRIM) 300 MG tablet TAKE 1 TABLET BY MOUTH ONCE DAILY 08/05/19   [provider]  Alpha-Lipoic Acid 600 MG CAPS Take 1 capsule (600 mg total) by mouth daily. 04/17/20 07/16/20  Gillis Santa, MD  Apremilast (OTEZLA) 30 MG TABS Take 1 tablet (30 mg total) by mouth 2 (two) times daily. 12/16/19   Ralene Bathe, MD  Apremilast (OTEZLA) 30 MG TABS Take 1 tablet (30 mg total) by mouth 2 (two) times daily. 05/08/20   Ralene Bathe, MD  calcium carbonate (OS-CAL) 1250 (500 Ca) MG chewable tablet Chew 1 tablet by mouth daily.    [provider]  cholecalciferol (VITAMIN D) 1000 units tablet Take 1,000 Units by mouth 2 (two) times daily.    [provider]  fenofibrate 160 MG tablet Take 1 tablet by mouth daily. 10/19/19   [provider]  glimepiride (AMARYL) 2 MG tablet Take by mouth. 06/16/19   [provider]  Glucosamine-Chondroit-Vit C-Mn (GLUCOSAMINE CHONDR 1500 COMPLX PO) Take 1 tablet by mouth 2 (two) times daily.    [provider]  ketoconazole (NIZORAL) 2 % cream Apply  a small amount to affected area as directed  apply to scaly areas between eyes, and around nose at bedtime on  monday, wednesday and friday Patient not taking: Reported on 04/17/2020 08/11/19   [provider]  Melatonin 5 MG TABS Take 1 tablet by mouth at bedtime.    [provider]  metFORMIN (GLUCOPHAGE) 1000 MG tablet TAKE 1 TABLET BY MOUTH  TWICE DAILY WITH MEALS 09/01/19   [provider]  mirabegron ER (MYRBETRIQ) 50 MG TB24 tablet Take 50 mg by mouth daily.    [provider]  Multiple Vitamins-Minerals (MULTIVITAMIN WITH MINERALS) tablet Take 1 tablet by mouth daily.    [provider]  NON FORMULARY Take 1 tablet by mouth  daily. [place]    [provider]  olmesartan-hydrochlorothiazide (BENICAR HCT) 40-12.5 MG tablet Take by mouth. 08/05/19   [provider]  omeprazole (PRILOSEC) 20 MG capsule TAKE 1 CAPSULE BY MOUTH  ONCE DAILY 05/25/19   [provider]  pregabalin (LYRICA) 50 MG capsule Take 1 capsule (50 mg total) by mouth at bedtime for 10 days, THEN 1 capsule (50 mg total) 2 (two) times daily. 04/17/20 05/27/20  Gillis Santa, MD  rOPINIRole (REQUIP) 1 MG tablet TAKE 1 TABLET BY MOUTH  TWICE DAILY 05/25/19   [provider]  rosuvastatin (CRESTOR) 10 MG tablet TAKE 1 TABLET BY MOUTH ONCE DAILY 09/01/19   [provider]  tamsulosin (FLOMAX) 0.4 MG CAPS capsule Take 0.4 mg by mouth daily.    [provider]  temazepam (RESTORIL) 7.5 MG capsule Take 7.5 mg by mouth at bedtime. Patient not taking: Reported on 04/17/2020    [provider]  traZODone (DESYREL) 50 MG tablet TAKE 2 TABLETS BY MOUTH AT  NIGHT AS NEEDED FOR SLEEP 08/05/19   [provider]  vitamin C (ASCORBIC ACID) 500 MG tablet Take 500 mg by mouth daily.    [provider]    Allergies Patient has no known allergies.  No family history on file.  Social History Social History   Tobacco Use  . Smoking status: Never Smoker  . Smokeless tobacco: Never Used  Vaping Use  . Vaping Use: Never used    Substance Use Topics  . Alcohol use: No  . Drug use: No    Review of Systems  Constitutional: No fever/chills.  Positive for left-sided aching neck pain.  Positive for generalized weakness. Eyes: No visual changes. ENT: No sore throat. Cardiovascular: Denies chest pain. Respiratory: Denies shortness of breath. Gastrointestinal: No abdominal pain.  No nausea, no vomiting.  No diarrhea.  No constipation. Genitourinary: Negative for dysuria. Musculoskeletal: Negative for back pain. Skin: Negative for rash. Neurological: Negative for headaches, focal weakness or numbness.  ____________________________________________   PHYSICAL EXAM:  VITAL SIGNS: Vitals:   05/21/20 1722 05/21/20 1730  BP: (!) 122/53 (!) 122/57  Pulse: 83 85  Resp: (!) 21 (!) 21  Temp:    SpO2: 97% 97%     Constitutional: Alert and oriented. Well appearing and in no acute distress.  Hard of hearing.  But pleasant and conversational in full sentences. Eyes: Conjunctivae are normal. PERRL. EOMI. Head: Atraumatic. Nose: No congestion/rhinnorhea. Mouth/Throat: Mucous membranes are dry.  Oropharynx non-erythematous. Neck: No stridor. No cervical spine tenderness to palpation.  Left-sided paraspinal cervical tenderness to palpation that reproduces his symptoms.  No midline tenderness or pain.  No overlying signs of trauma or skin changes. Cardiovascular: Normal rate, regular rhythm. Grossly normal heart sounds.  Good peripheral circulation. Respiratory: Normal respiratory effort.  No retractions. Lungs CTAB. Gastrointestinal: Soft , nondistended, nontender to palpation. No CVA tenderness. Musculoskeletal: No lower extremity tenderness nor edema.  No joint effusions. No signs of acute trauma. Neurologic:  Normal speech and language. No gross focal neurologic deficits are appreciated. No gait instability noted. Cranial nerves II through XII intact 5/5 strength and sensation in all 4 extremities Skin:  Skin is  warm, dry and intact. No rash noted. Psychiatric: Mood and affect are normal. Speech and behavior are normal.  ____________________________________________   LABS (all labs ordered are listed, but only abnormal results are displayed)  Labs Reviewed  CBC - Abnormal; Notable for the following components:      Result Value  RBC 3.59 (*)    Hemoglobin 11.1 (*)    HCT 34.3 (*)    All other components within normal limits  COMPREHENSIVE METABOLIC PANEL - Abnormal; Notable for the following components:   Glucose, Bld 52 (*)    BUN 35 (*)    Creatinine, Ser 1.71 (*)    Alkaline Phosphatase 34 (*)    GFR, Estimated 39 (*)    All other components within normal limits  CBG MONITORING, ED - Abnormal; Notable for the following components:   Glucose-Capillary 119 (*)    All other components within normal limits  URINALYSIS, COMPLETE (UACMP) WITH MICROSCOPIC   ____________________________________________  12 Lead EKG Sinus rhythm, rate of 91 bpm.  Normal axis.  Left anterior fascicular block, otherwise normal intervals.  Single PVC.  Abnormal R wave progression suggestive of lead reversal.  No evidence of acute ischemia.  T wave inversions noted to leads I and aVL at baseline when compared to EKG from 2018.  ____________________________________________  RADIOLOGY  ED MD interpretation: CT head reviewed by me without evidence of acute intracranial pathology.  Official radiology report(s): CT Head Wo Contrast  Result Date: 05/21/2020 CLINICAL DATA:  Dizziness and fall. EXAM: CT HEAD WITHOUT CONTRAST TECHNIQUE: Contiguous axial images were obtained from the base of the skull through the vertex without intravenous contrast. COMPARISON:  None. FINDINGS: Brain: There is mild cerebral atrophy with widening of the extra-axial spaces and ventricular dilatation. There are areas of decreased attenuation within the white matter tracts of the supratentorial brain, consistent with microvascular disease  changes. Vascular: No hyperdense vessel or unexpected calcification. Skull: Normal. Negative for fracture or focal lesion. Sinuses/Orbits: No acute finding. Other: None. IMPRESSION: No acute intracranial pathology. Electronically Signed   By: Virgina Norfolk M.D.   On: 05/21/2020 16:08   CT Cervical Spine Wo Contrast  Result Date: 05/21/2020 CLINICAL DATA:  Dizziness and fall. EXAM: CT CERVICAL SPINE WITHOUT CONTRAST TECHNIQUE: Multidetector CT imaging of the cervical spine was performed without intravenous contrast. Multiplanar CT image reconstructions were also generated. COMPARISON:  July 01, 2018 FINDINGS: Alignment: Normal. Skull base and vertebrae: No acute fracture. No primary bone lesion or focal pathologic process. Soft tissues and spinal canal: No prevertebral fluid or swelling. No visible canal hematoma. Disc levels: Moderate to marked severity endplate sclerosis is seen at the levels of C4-C5 and C6-C7. Moderate to marked severity intervertebral disc space narrowing is also seen at these levels. Moderate severity bilateral multilevel facet joint hypertrophy is noted. Upper chest: Negative. Other: None. IMPRESSION: 1. Moderate to marked severity degenerative changes of the cervical spine, as described above. 2. No evidence of an acute fracture or subluxation. Electronically Signed   By: Virgina Norfolk M.D.   On: 05/21/2020 16:30   ____________________________________________   PROCEDURES and INTERVENTIONS  Procedure(s) performed (including Critical Care):  .1-3 Lead EKG Interpretation Performed by: Vladimir Crofts, MD Authorized by: Vladimir Crofts, MD     Interpretation: normal     ECG rate:  80   ECG rate assessment: normal     Rhythm: sinus rhythm     Ectopy: none     Conduction: normal      Medications  lidocaine (LIDODERM) 5 % 1 patch (1 patch Transdermal Patch Applied 05/21/20 1600)  acetaminophen (TYLENOL) tablet 1,000 mg (1,000 mg Oral Given 05/21/20 1600)  lactated  ringers bolus 1,000 mL (0 mLs Intravenous Stopped 05/21/20 1733)    ____________________________________________   MDM / ED COURSE    Rather functional 85 year old  man presents from home after feeling weak and falling while walking his dog this morning after taking a new sleeping medication, with evidence of dehydration and ultimately amenable to outpatient management.  Normal vitals on room air.  Exam is reassuring without evidence of significant acute pathology.  He has no evidence of traumatic pathology from falling forward and catching himself, has no evidence of distress or neurovascular deficits.  He does have stigmata of dehydration and is requesting water to drink.  His blood work shows evidence of dehydration with mild prerenal AKI, for which he received a liter of LR as well as oral rehydration.  Blood work further with mild hyperglycemia, which was treated with a meal tray.  Repeat CBG normalizes at 120.  Patient ambulatory at his baseline without symptoms.  He is requesting discharge and I think this is reasonable.  We discussed return precautions for the ED.  We discussed follow-up with PCP for a renal function recheck.  Patient medically stable for discharge.  Clinical Course as of May 21 1817  Sun May 21, 2020  1558 Low glucose noted on serum blood work.  Nurse is bringing food to the patient.  He continues to look well.   [DS]  1610 [id number].  Patient sleeping comfortably when I enter the room.  He reports improving neck pain with Tylenol and lidocaine patch.  He has tolerated a full meal tray.  We discussed rechecking of his blood glucose and ambulation trial for likely outpatient management.   [DS]  1914 Nurse informs me of ambulation trial without dizziness, weakness or falls.  Patient reports he feels well.  Repeat glucose 119.  We again discussed outpatient management and return precautions for the ED.   [DS]    Clinical Course User Index [DS] Vladimir Crofts, MD     ____________________________________________   FINAL CLINICAL IMPRESSION(S) / ED DIAGNOSES  Final diagnoses:  Fall, initial encounter  Generalized weakness  AKI (acute kidney injury) ([place])  Dehydration     ED Discharge Orders    None       Keyvin Rison   Note:  This document was prepared using Dragon voice recognition software and may include unintentional dictation errors.   Vladimir Crofts, MD 05/21/20 [phone]

## 2020-05-21 NOTE — ED Notes (Signed)
Called pt's wife and gave update per pt's request

## 2020-05-21 NOTE — ED Notes (Signed)
Pt taken for CT 

## 2020-05-21 NOTE — ED Notes (Signed)
Pt ambulated in hallway well with cane and denies any dizziness

## 2020-05-21 NOTE — ED Triage Notes (Signed)
Pt arrives via EMS from home after waking up this morning with dizziness- pt states he took a new sleeping pill last night and thinks that did it- pt was waking dog and was not feeling well- dog drug him down to ground and pt called 911 to get hep up- however EMS discovered and irregular heart rate with frequent PVC's- pt cbg was 65- EMS gave 572ml fluids and 1 tube oral glucose- per EMS pt PVCs were less frequent after fluids and that pt's mucous membranes were dry despite pt stating he has been eating and drinking okay- VSS per EMS

## 2020-05-21 NOTE — Discharge Instructions (Signed)
As we discussed, I would recommend that you do not take that additional sleep aid again.  Please stick to your original regimen.  Continue your normal medication regimen otherwise.  Return to the ED with any worsening symptoms or recurrent falls.

## 2020-05-22 ENCOUNTER — Encounter: Payer: Self-pay | Admitting: Internal Medicine

## 2020-05-22 ENCOUNTER — Encounter: Payer: Self-pay | Admitting: Student in an Organized Health Care Education/Training Program

## 2020-05-22 ENCOUNTER — Ambulatory Visit (HOSPITAL_BASED_OUTPATIENT_CLINIC_OR_DEPARTMENT_OTHER): Payer: Medicare Other | Admitting: Student in an Organized Health Care Education/Training Program

## 2020-05-22 ENCOUNTER — Inpatient Hospital Stay
Admission: EM | Admit: 2020-05-22 | Discharge: 2020-05-24 | DRG: 639 | Disposition: A | Discharge status: Home / Self Care | Payer: Medicare Other | Attending: Internal Medicine | Admitting: Internal Medicine

## 2020-05-22 ENCOUNTER — Other Ambulatory Visit: Payer: Self-pay

## 2020-05-22 VITALS — BP 126/71 | HR 81 | Temp 97.1°F | Resp 16 | Ht 72.0 in | Wt 212.0 lb

## 2020-05-22 DIAGNOSIS — I493 Ventricular premature depolarization: Secondary | ICD-10-CM | POA: Diagnosis present

## 2020-05-22 DIAGNOSIS — M792 Neuralgia and neuritis, unspecified: Secondary | ICD-10-CM | POA: Insufficient documentation

## 2020-05-22 DIAGNOSIS — Z9689 Presence of other specified functional implants: Secondary | ICD-10-CM | POA: Diagnosis not present

## 2020-05-22 DIAGNOSIS — Z79899 Other long term (current) drug therapy: Secondary | ICD-10-CM

## 2020-05-22 DIAGNOSIS — Z8546 Personal history of malignant neoplasm of prostate: Secondary | ICD-10-CM

## 2020-05-22 DIAGNOSIS — G894 Chronic pain syndrome: Secondary | ICD-10-CM | POA: Insufficient documentation

## 2020-05-22 DIAGNOSIS — E669 Obesity, unspecified: Secondary | ICD-10-CM | POA: Diagnosis present

## 2020-05-22 DIAGNOSIS — E11649 Type 2 diabetes mellitus with hypoglycemia without coma: Principal | ICD-10-CM

## 2020-05-22 DIAGNOSIS — E860 Dehydration: Secondary | ICD-10-CM | POA: Diagnosis present

## 2020-05-22 DIAGNOSIS — K219 Gastro-esophageal reflux disease without esophagitis: Secondary | ICD-10-CM | POA: Diagnosis present

## 2020-05-22 DIAGNOSIS — G8929 Other chronic pain: Secondary | ICD-10-CM | POA: Insufficient documentation

## 2020-05-22 DIAGNOSIS — N183 Chronic kidney disease, stage 3 unspecified: Secondary | ICD-10-CM | POA: Diagnosis not present

## 2020-05-22 DIAGNOSIS — Z96651 Presence of right artificial knee joint: Secondary | ICD-10-CM | POA: Insufficient documentation

## 2020-05-22 DIAGNOSIS — Z20822 Contact with and (suspected) exposure to covid-19: Secondary | ICD-10-CM | POA: Diagnosis present

## 2020-05-22 DIAGNOSIS — Z96612 Presence of left artificial shoulder joint: Secondary | ICD-10-CM | POA: Insufficient documentation

## 2020-05-22 DIAGNOSIS — E1122 Type 2 diabetes mellitus with diabetic chronic kidney disease: Secondary | ICD-10-CM | POA: Diagnosis present

## 2020-05-22 DIAGNOSIS — E782 Mixed hyperlipidemia: Secondary | ICD-10-CM | POA: Diagnosis present

## 2020-05-22 DIAGNOSIS — M25561 Pain in right knee: Secondary | ICD-10-CM

## 2020-05-22 DIAGNOSIS — J8410 Pulmonary fibrosis, unspecified: Secondary | ICD-10-CM | POA: Diagnosis present

## 2020-05-22 DIAGNOSIS — Z66 Do not resuscitate: Secondary | ICD-10-CM | POA: Diagnosis present

## 2020-05-22 DIAGNOSIS — Z96641 Presence of right artificial hip joint: Secondary | ICD-10-CM | POA: Insufficient documentation

## 2020-05-22 DIAGNOSIS — Z6828 Body mass index (BMI) 28.0-28.9, adult: Secondary | ICD-10-CM

## 2020-05-22 DIAGNOSIS — M961 Postlaminectomy syndrome, not elsewhere classified: Secondary | ICD-10-CM

## 2020-05-22 DIAGNOSIS — M25562 Pain in left knee: Secondary | ICD-10-CM | POA: Insufficient documentation

## 2020-05-22 DIAGNOSIS — N179 Acute kidney failure, unspecified: Secondary | ICD-10-CM

## 2020-05-22 DIAGNOSIS — N1831 Chronic kidney disease, stage 3a: Secondary | ICD-10-CM | POA: Diagnosis present

## 2020-05-22 DIAGNOSIS — W19XXXA Unspecified fall, initial encounter: Secondary | ICD-10-CM

## 2020-05-22 DIAGNOSIS — E1165 Type 2 diabetes mellitus with hyperglycemia: Secondary | ICD-10-CM | POA: Diagnosis present

## 2020-05-22 DIAGNOSIS — E1140 Type 2 diabetes mellitus with diabetic neuropathy, unspecified: Secondary | ICD-10-CM | POA: Diagnosis present

## 2020-05-22 DIAGNOSIS — I129 Hypertensive chronic kidney disease with stage 1 through stage 4 chronic kidney disease, or unspecified chronic kidney disease: Secondary | ICD-10-CM | POA: Diagnosis present

## 2020-05-22 DIAGNOSIS — D631 Anemia in chronic kidney disease: Secondary | ICD-10-CM | POA: Diagnosis present

## 2020-05-22 DIAGNOSIS — M109 Gout, unspecified: Secondary | ICD-10-CM | POA: Diagnosis present

## 2020-05-22 DIAGNOSIS — E162 Hypoglycemia, unspecified: Secondary | ICD-10-CM

## 2020-05-22 DIAGNOSIS — Z85820 Personal history of malignant melanoma of skin: Secondary | ICD-10-CM

## 2020-05-22 DIAGNOSIS — G4733 Obstructive sleep apnea (adult) (pediatric): Secondary | ICD-10-CM | POA: Diagnosis present

## 2020-05-22 DIAGNOSIS — Z7984 Long term (current) use of oral hypoglycemic drugs: Secondary | ICD-10-CM

## 2020-05-22 LAB — CBC
HCT: 32.5 % — ABNORMAL LOW (ref 39.0–52.0)
Hemoglobin: 10.4 g/dL — ABNORMAL LOW (ref 13.0–17.0)
MCH: 31 pg (ref 26.0–34.0)
MCHC: 32 g/dL (ref 30.0–36.0)
MCV: 97 fL (ref 80.0–100.0)
Platelets: 270 10*3/uL (ref 150–400)
RBC: 3.35 MIL/uL — ABNORMAL LOW (ref 4.22–5.81)
RDW: 13.2 % (ref 11.5–15.5)
WBC: 9.1 10*3/uL (ref 4.0–10.5)
nRBC: 0 % (ref 0.0–0.2)

## 2020-05-22 LAB — URINALYSIS, COMPLETE (UACMP) WITH MICROSCOPIC
Bacteria, UA: NONE SEEN
Bilirubin Urine: NEGATIVE
Glucose, UA: NEGATIVE mg/dL
Hgb urine dipstick: NEGATIVE
Ketones, ur: NEGATIVE mg/dL
Leukocytes,Ua: NEGATIVE
Nitrite: NEGATIVE
Protein, ur: NEGATIVE mg/dL
Specific Gravity, Urine: 1.009 (ref 1.005–1.030)
pH: 6 (ref 5.0–8.0)

## 2020-05-22 LAB — CBG MONITORING, ED
Glucose-Capillary: 42 mg/dL — CL (ref 70–99)
Glucose-Capillary: 56 mg/dL — ABNORMAL LOW (ref 70–99)
Glucose-Capillary: 77 mg/dL (ref 70–99)

## 2020-05-22 LAB — BASIC METABOLIC PANEL
Anion gap: 9 (ref 5–15)
BUN: 27 mg/dL — ABNORMAL HIGH (ref 8–23)
CO2: 24 mmol/L (ref 22–32)
Calcium: 9.3 mg/dL (ref 8.9–10.3)
Chloride: 105 mmol/L (ref 98–111)
Creatinine, Ser: 1.47 mg/dL — ABNORMAL HIGH (ref 0.61–1.24)
GFR, Estimated: 47 mL/min — ABNORMAL LOW (ref >60)
Glucose, Bld: 38 mg/dL — CL (ref 70–99)
Potassium: 4.3 mmol/L (ref 3.5–5.1)
Sodium: 138 mmol/L (ref 135–145)

## 2020-05-22 LAB — RESP PANEL BY RT-PCR (FLU A&B, COVID) ARPGX2
Influenza A by PCR: NEGATIVE
Influenza B by PCR: NEGATIVE
SARS Coronavirus 2 by RT PCR: NEGATIVE

## 2020-05-22 LAB — GLUCOSE, CAPILLARY: Glucose-Capillary: 107 mg/dL — ABNORMAL HIGH (ref 70–99)

## 2020-05-22 MED ORDER — ENOXAPARIN SODIUM 40 MG/0.4ML ~~LOC~~ SOLN
40.0000 mg | SUBCUTANEOUS | Status: DC
  Administered 2020-05-22 – 2020-05-23 (×2): 40 mg via SUBCUTANEOUS
  Filled 2020-05-22: qty 0.4

## 2020-05-22 MED ORDER — ONDANSETRON HCL 4 MG/2ML IJ SOLN: 4.0000 mg | Freq: Four times a day (QID) | INTRAMUSCULAR | Status: DC | PRN

## 2020-05-22 MED ORDER — ONDANSETRON HCL 4 MG PO TABS: 4.0000 mg | ORAL_TABLET | Freq: Four times a day (QID) | ORAL | Status: DC | PRN

## 2020-05-22 MED ORDER — ROPINIROLE HCL 1 MG PO TABS
1.0000 mg | ORAL_TABLET | Freq: Two times a day (BID) | ORAL | Status: DC
  Administered 2020-05-22 – 2020-05-24 (×4): 1 mg via ORAL
  Filled 2020-05-22 (×4): qty 1

## 2020-05-22 MED ORDER — DEXTROSE-NACL 5-0.9 % IV SOLN: INTRAVENOUS | Status: AC

## 2020-05-22 MED ORDER — PANTOPRAZOLE SODIUM 40 MG PO TBEC
40.0000 mg | DELAYED_RELEASE_TABLET | Freq: Every day | ORAL | Status: DC
  Administered 2020-05-23 – 2020-05-24 (×2): 40 mg via ORAL
  Filled 2020-05-22 (×3): qty 1

## 2020-05-22 MED ORDER — PREGABALIN 50 MG PO CAPS
50.0000 mg | ORAL_CAPSULE | Freq: Two times a day (BID) | ORAL | Status: DC
  Administered 2020-05-22 – 2020-05-24 (×4): 50 mg via ORAL
  Filled 2020-05-22 (×4): qty 1

## 2020-05-22 MED ORDER — TAMSULOSIN HCL 0.4 MG PO CAPS
0.4000 mg | ORAL_CAPSULE | Freq: Every day | ORAL | Status: DC
  Administered 2020-05-23 – 2020-05-24 (×2): 0.4 mg via ORAL
  Filled 2020-05-22 (×3): qty 1

## 2020-05-22 MED ORDER — PREGABALIN 50 MG PO CAPS: 50.0000 mg | ORAL_CAPSULE | Freq: Two times a day (BID) | ORAL | 3 refills | Status: DC

## 2020-05-22 MED ORDER — ACETAMINOPHEN 325 MG PO TABS: 650.0000 mg | ORAL_TABLET | Freq: Four times a day (QID) | ORAL | Status: DC | PRN

## 2020-05-22 MED ORDER — ACETAMINOPHEN 650 MG RE SUPP: 650.0000 mg | Freq: Four times a day (QID) | RECTAL | Status: DC | PRN

## 2020-05-22 MED ORDER — DEXTROSE 10 % IV SOLN: INTRAVENOUS | Status: DC

## 2020-05-22 MED ORDER — TRAZODONE HCL 50 MG PO TABS: 50.0000 mg | ORAL_TABLET | Freq: Every evening | ORAL | Status: DC | PRN

## 2020-05-22 MED ORDER — MIRABEGRON ER 50 MG PO TB24
50.0000 mg | ORAL_TABLET | Freq: Every day | ORAL | Status: DC
  Administered 2020-05-23 – 2020-05-24 (×2): 50 mg via ORAL
  Filled 2020-05-22 (×3): qty 1

## 2020-05-22 NOTE — ED Notes (Signed)
ED Provider at bedside. 

## 2020-05-22 NOTE — Progress Notes (Signed)
Safety precautions to be maintained throughout the outpatient stay will include: orient to surroundings, keep bed in low position, maintain call bell within reach at all times, provide assistance with transfer out of bed and ambulation.  

## 2020-05-22 NOTE — Assessment & Plan Note (Signed)
Acute worse. Baseline Scr 1.1

## 2020-05-22 NOTE — Assessment & Plan Note (Signed)
Verified with pt that he will remain DNR/DNI.

## 2020-05-22 NOTE — Assessment & Plan Note (Signed)
Admit to observation. Continue with dextrose IVF. Hold amaryl and metformin. When he is discharged, please discontinue amaryl. May continue metformin. Pt will need PCP followup prior to restarting amaryl. Check A1c.

## 2020-05-22 NOTE — Subjective & Objective (Signed)
CC: hypoglycemia HPI: 85 yo WM with hx of type 2 DM, HTN, diabetic neuropathy, presents to ER for the 2nd time in the last 2 days for hypoglycemia. Pt seen yesterday for FSBS in the 42s. Pt given IVF and observed in the ER. FSBS stabilized and pt discharged to home. Pt states he was not told to stop his amaryl or metformin. Pt presented again today to ER for headache, dizziness and fall. FSBS in the ER today was 52.  This dropped again about 1 hour later to 42 and then to 38. No seizure activity. Pt requiring D10W IVF to keep FSBS from dropping.  Pt without any chest pain or SOB. No abd pain. No N/V. Pt states he eats normally at home. Pt lives at home with his wife.  Due to persistent hypoglycemia requiring IVF dextrose fluids, [facility] hospitalist contacted for admission.

## 2020-05-22 NOTE — Assessment & Plan Note (Signed)
Continue lyrica 

## 2020-05-22 NOTE — ED Triage Notes (Signed)
Pt from home AOx4. C/C Headache ongoing for 3days, associated with dizzyness and falls  ( No injuries from fall) . Pt was seen yesterday for the same. Initial blood sugar with EMS 47, was given some candy and blood sugar increased to 70. Per EMS Pt was negative on stroke scale also has pacemaker

## 2020-05-22 NOTE — H&P (Signed)
History and Physical    Andrew Obrien PZW:[id number] DOB: 16-Dec-1934 DOA: 05/22/2020  PCP: Dion Body, MD   Patient coming from: Home  I have personally briefly reviewed patient's old medical records in [place]  CC: hypoglycemia HPI: 85 yo WM with hx of type 2 DM, HTN, diabetic neuropathy, presents to ER for the 2nd time in the last 2 days for hypoglycemia. Pt seen yesterday for FSBS in the 44s. Pt given IVF and observed in the ER. FSBS stabilized and pt discharged to home. Pt states he was not told to stop his amaryl or metformin. Pt presented again today to ER for headache, dizziness and fall. FSBS in the ER today was 52.  This dropped again about 1 hour later to 42 and then to 38. No seizure activity. Pt requiring D10W IVF to keep FSBS from dropping.  Pt without any chest pain or SOB. No abd pain. No N/V. Pt states he eats normally at home. Pt lives at home with his wife.  Due to persistent hypoglycemia requiring IVF dextrose fluids, [facility] hospitalist contacted for admission.    ED Course: pt given IVF with dextrose due to persistent hypoglycemia.  Review of Systems:  Review of Systems  Constitutional: Negative for chills and fever.  HENT: Negative.   Eyes: Negative.   Respiratory: Negative.   Cardiovascular: Negative.   Gastrointestinal: Negative.   Genitourinary: Negative.   Musculoskeletal: Negative.   Skin: Negative.   Neurological: Positive for tingling and weakness.       Chronic neuropathy in his feet  Endo/Heme/Allergies:       Low blood sugar  Psychiatric/Behavioral: Negative.   All other systems reviewed and are negative.   Past Medical History:  Diagnosis Date  . Arthritis   . Depression   . Diabetes mellitus without complication ([place])   . Difficult intubation   . GERD (gastroesophageal reflux disease)   . Heart murmur   . Hx of basal cell carcinoma 2001, 2002   R cheek txted in [place]  . Hx of basal cell carcinoma 2004   L cheek txted in  [place]  . Hx of basal cell carcinoma 2005   Forehead txted in [place]  . Hx of basal cell carcinoma 2006   Back, txted in [place]  . Hx of basal cell carcinoma 2008   R post shoulder txted in [place]  . Hx of basal cell carcinoma 2009   Face and Back, L neck txted in [place]  . Hx of basal cell carcinoma 2015   L temple, R ear x 2 txted by Dr. Blane Ohara [place]  . Hx of basal cell carcinoma 2016   Back txted by Dr. Rayburn Felt [place]  . Hx of basal cell carcinoma 2017   L face x 2 txted by Dr. Blane Ohara [place]  . Hx of basal cell carcinoma 2020   Multiple face, back, shoulders txted by Dr. Evorn Gong  . Hypertension   . Macular degeneration    Right  . Melanoma ([place]) 1973   R cheek txted in [place]  . Neuropathy   . OSA on CPAP   . Prostate cancer ([place])   . Psoriasis   . Squamous cell carcinoma of skin 2006   SCCIS back txted in [place]    Past Surgical History:  Procedure Laterality Date  . CATARACT EXTRACTION W/ INTRAOCULAR LENS  IMPLANT, BILATERAL    . LUMBAR SPINE SURGERY    . MOUTH SURGERY    . PAIN PUMP  IMPLANTATION    . prostate cancer    . REPLACEMENT TOTAL KNEE Right    x 2  . ROTATOR CUFF REPAIR Left   . SKIN CANCER EXCISION    . TOTAL HIP ARTHROPLASTY Right 04/14/2017   Procedure: TOTAL HIP ARTHROPLASTY;  Surgeon: Dereck Leep, MD;  Location: [facility] ORS;  Service: Orthopedics;  Laterality: Right;  . TOTAL SHOULDER REPLACEMENT Left      reports that he has never smoked. He has never used smokeless tobacco. He reports that he does not drink alcohol and does not use drugs.  No Known Allergies  No family history on file.  Prior to Admission medications   Medication Sig Start Date End Date Taking? Authorizing Provider  allopurinol (ZYLOPRIM) 300 MG tablet TAKE 1 TABLET BY MOUTH ONCE DAILY 08/05/19   [provider]  Alpha-Lipoic Acid 600 MG CAPS Take 1 capsule (600 mg total) by mouth daily. 04/17/20 07/16/20  Gillis Santa, MD  Apremilast (OTEZLA) 30 MG TABS Take 1 tablet (30  mg total) by mouth 2 (two) times daily. 12/16/19   Ralene Bathe, MD  Apremilast (OTEZLA) 30 MG TABS Take 1 tablet (30 mg total) by mouth 2 (two) times daily. Patient not taking: Reported on 05/22/2020 05/08/20   Ralene Bathe, MD  calcium carbonate (OS-CAL) 1250 (500 Ca) MG chewable tablet Chew 1 tablet by mouth daily.    [provider]  cholecalciferol (VITAMIN D) 1000 units tablet Take 1,000 Units by mouth 2 (two) times daily.    [provider]  fenofibrate 160 MG tablet Take 1 tablet by mouth daily. 10/19/19   [provider]  glimepiride (AMARYL) 2 MG tablet Take by mouth. 06/16/19   [provider]  Glucosamine-Chondroit-Vit C-Mn (GLUCOSAMINE CHONDR 1500 COMPLX PO) Take 1 tablet by mouth 2 (two) times daily.    [provider]  ketoconazole (NIZORAL) 2 % cream Apply  a small amount to affected area as directed  apply to scaly areas between eyes, and around nose at bedtime on monday, wednesday and friday Patient not taking: Reported on 04/17/2020 08/11/19   [provider]  Melatonin 5 MG TABS Take 1 tablet by mouth at bedtime.    [provider]  metFORMIN (GLUCOPHAGE) 1000 MG tablet TAKE 1 TABLET BY MOUTH  TWICE DAILY WITH MEALS 09/01/19   [provider]  mirabegron ER (MYRBETRIQ) 50 MG TB24 tablet Take 50 mg by mouth daily.    [provider]  Multiple Vitamins-Minerals (MULTIVITAMIN WITH MINERALS) tablet Take 1 tablet by mouth daily.    [provider]  NON FORMULARY Take 1 tablet by mouth daily. [place]    [provider]  olmesartan-hydrochlorothiazide (BENICAR HCT) 40-12.5 MG tablet Take by mouth. 08/05/19   [provider]  omeprazole (PRILOSEC) 20 MG capsule TAKE 1 CAPSULE BY MOUTH  ONCE DAILY 05/25/19   [provider]  pregabalin (LYRICA) 50 MG capsule Take 1 capsule (50 mg total) by mouth 2 (two) times daily. 05/22/20 09/19/20  Gillis Santa, MD  rOPINIRole  (REQUIP) 1 MG tablet TAKE 1 TABLET BY MOUTH  TWICE DAILY 05/25/19   [provider]  rosuvastatin (CRESTOR) 10 MG tablet TAKE 1 TABLET BY MOUTH ONCE DAILY 09/01/19   [provider]  tamsulosin (FLOMAX) 0.4 MG CAPS capsule Take 0.4 mg by mouth daily.    [provider]  temazepam (RESTORIL) 7.5 MG capsule Take 7.5 mg by mouth at bedtime. Patient not taking: Reported on 04/17/2020  [provider]  traZODone (DESYREL) 50 MG tablet TAKE 2 TABLETS BY MOUTH AT  NIGHT AS NEEDED FOR SLEEP 08/05/19   [provider]  vitamin C (ASCORBIC ACID) 500 MG tablet Take 500 mg by mouth daily.    [provider]    Physical Exam: Vitals:   05/22/20 1615 05/22/20 1616 05/22/20 1617  BP:  (!) 113/49   Pulse:  76   Resp:  18   Temp:  98 F (36.7 C)   SpO2: 98% 99%   Weight:   96.2 kg  Height:   6' (1.829 m)    Physical Exam Vitals and nursing note reviewed.  Constitutional:      General: He is not in acute distress.    Appearance: Normal appearance. He is normal weight. He is not ill-appearing, toxic-appearing or diaphoretic.  HENT:     Head: Normocephalic and atraumatic.     Nose: Nose normal. No rhinorrhea.  Eyes:     General: No scleral icterus.       Right eye: No discharge.        Left eye: No discharge.  Cardiovascular:     Rate and Rhythm: Normal rate.     Heart sounds: Murmur heard.  Systolic murmur is present with a grade of 2/6.   Pulmonary:     Effort: Pulmonary effort is normal. No respiratory distress.     Breath sounds: No wheezing or rales.  Abdominal:     General: Bowel sounds are normal. There is no distension.     Palpations: Abdomen is soft.     Tenderness: There is no abdominal tenderness. There is no guarding.  Musculoskeletal:     Right lower leg: No edema.     Left lower leg: No edema.  Skin:    General: Skin is warm and dry.     Capillary Refill: Capillary refill takes less than 2 seconds.  Neurological:      General: No focal deficit present.     Mental Status: He is alert and oriented to person, place, and time.      Labs on Admission: I have personally reviewed following labs and imaging studies  CBC: Recent Labs  Lab 05/21/20 1514 05/22/20 1639  WBC 8.3 9.1  HGB 11.1* 10.4*  HCT 34.3* 32.5*  MCV 95.5 97.0  PLT 279 419   Basic Metabolic Panel: Recent Labs  Lab 05/21/20 1514 05/22/20 1639  NA 140 138  K 4.0 4.3  CL 109 105  CO2 23 24  GLUCOSE 52* 38*  BUN 35* 27*  CREATININE 1.71* 1.47*  CALCIUM 9.1 9.3   GFR: Estimated Creatinine Clearance: 45.8 mL/min (A) (by C-G formula based on SCr of 1.47 mg/dL (H)). Liver Function Tests: Recent Labs  Lab 05/21/20 1514  AST 23  ALT 20  ALKPHOS 34*  BILITOT 0.6  PROT 6.6  ALBUMIN 3.7   No results for input(s): LIPASE, AMYLASE in the last 168 hours. No results for input(s): AMMONIA in the last 168 hours. Coagulation Profile: No results for input(s): INR, PROTIME in the last 168 hours. Cardiac Enzymes: No results for input(s): CKTOTAL, CKMB, CKMBINDEX, TROPONINI in the last 168 hours. BNP (last 3 results) No results for input(s): PROBNP in the last 8760 hours. HbA1C: No results for input(s): HGBA1C in the last 72 hours. CBG: Recent Labs  Lab 05/21/20 1720 05/22/20 1631 05/22/20 1731 05/22/20 1837  GLUCAP 119* 42* 56* 77   Lipid Profile: No results for input(s): CHOL, HDL,  LDLCALC, TRIG, CHOLHDL, LDLDIRECT in the last 72 hours. Thyroid Function Tests: No results for input(s): TSH, T4TOTAL, FREET4, T3FREE, THYROIDAB in the last 72 hours. Anemia Panel: No results for input(s): VITAMINB12, FOLATE, FERRITIN, TIBC, IRON, RETICCTPCT in the last 72 hours. Urine analysis:    Component Value Date/Time   COLORURINE YELLOW (A) 05/22/2020 1855   APPEARANCEUR CLEAR (A) 05/22/2020 1855   LABSPEC 1.009 05/22/2020 1855   PHURINE 6.0 05/22/2020 1855   GLUCOSEU NEGATIVE 05/22/2020 1855   HGBUR NEGATIVE 05/22/2020 1855    BILIRUBINUR NEGATIVE 05/22/2020 1855   KETONESUR NEGATIVE 05/22/2020 1855   PROTEINUR NEGATIVE 05/22/2020 1855   NITRITE NEGATIVE 05/22/2020 1855   LEUKOCYTESUR NEGATIVE 05/22/2020 1855    Radiological Exams on Admission: I have personally reviewed images CT Head Wo Contrast  Result Date: 05/21/2020 CLINICAL DATA:  Dizziness and fall. EXAM: CT HEAD WITHOUT CONTRAST TECHNIQUE: Contiguous axial images were obtained from the base of the skull through the vertex without intravenous contrast. COMPARISON:  None. FINDINGS: Brain: There is mild cerebral atrophy with widening of the extra-axial spaces and ventricular dilatation. There are areas of decreased attenuation within the white matter tracts of the supratentorial brain, consistent with microvascular disease changes. Vascular: No hyperdense vessel or unexpected calcification. Skull: Normal. Negative for fracture or focal lesion. Sinuses/Orbits: No acute finding. Other: None. IMPRESSION: No acute intracranial pathology. Electronically Signed   By: Virgina Norfolk M.D.   On: 05/21/2020 16:08   CT Cervical Spine Wo Contrast  Result Date: 05/21/2020 CLINICAL DATA:  Dizziness and fall. EXAM: CT CERVICAL SPINE WITHOUT CONTRAST TECHNIQUE: Multidetector CT imaging of the cervical spine was performed without intravenous contrast. Multiplanar CT image reconstructions were also generated. COMPARISON:  July 01, 2018 FINDINGS: Alignment: Normal. Skull base and vertebrae: No acute fracture. No primary bone lesion or focal pathologic process. Soft tissues and spinal canal: No prevertebral fluid or swelling. No visible canal hematoma. Disc levels: Moderate to marked severity endplate sclerosis is seen at the levels of C4-C5 and C6-C7. Moderate to marked severity intervertebral disc space narrowing is also seen at these levels. Moderate severity bilateral multilevel facet joint hypertrophy is noted. Upper chest: Negative. Other: None. IMPRESSION: 1. Moderate to  marked severity degenerative changes of the cervical spine, as described above. 2. No evidence of an acute fracture or subluxation. Electronically Signed   By: Virgina Norfolk M.D.   On: 05/21/2020 16:30    EKG: I have personally reviewed EKG: NSR  85 yo WM admitted for diabetic hypoglycemia and AKI.  Assessment/Plan Principal Problem:   Diabetic hypoglycemia (HCC) Active Problems:   AKI (acute kidney injury) ([place])   Neuropathic pain   CKD (chronic kidney disease) stage 3, GFR 30-59 ml/min (HCC)   DNR (do not resuscitate)/DNI(Do Not Intubate)    Diabetic hypoglycemia ([place]) Admit to observation. Continue with dextrose IVF. Hold amaryl and metformin. When he is discharged, please discontinue amaryl. May continue metformin. Pt will need PCP followup prior to restarting amaryl. Check A1c.  AKI (acute kidney injury) ([place]) Continue with IVF. Repeat BMP in AM. Hold HCTZ/ARB.  Neuropathic pain Continue lyrica.  CKD (chronic kidney disease) stage 3, GFR 30-59 ml/min (HCC) Acute worse. Baseline Scr 1.1  DNR (do not resuscitate)/DNI(Do Not Intubate) Verified with pt that he will remain DNR/DNI.   DVT prophylaxis: Lovenox Code Status: DNR/DNI(Do NOT Intubate) Family Communication: no family at bedside  Disposition Plan: DC to home  Consults called: none  Admission status: Observation, Med-Surg   Kristopher Oppenheim, DO [facility] 05/22/2020, 7:35 PM

## 2020-05-22 NOTE — Progress Notes (Signed)
PROVIDER NOTE: Information contained herein reflects review and annotations entered in association with encounter. Interpretation of such information and data should be left to medically-trained personnel. Information provided to patient can be located elsewhere in the medical record under "Patient Instructions". Document created using STT-dictation technology, any transcriptional errors that may result from process are unintentional.    Patient: Andrew Obrien  Service Category: E/M  Provider: Gillis Santa, MD  DOB: April 21, 1935  DOS: 05/22/2020  Specialty: Interventional Pain Management  MRN: [id number]  Setting: Ambulatory outpatient  PCP: Dion Body, MD  Type: Established Patient    Referring Provider: Dion Body, MD  Location: Office  Delivery: Face-to-face     HPI  [name], a 85 y.o. year old male, is here today because of his Neuropathic pain [M79.2]. [name] primary complain today is Leg Pain (bilateral, neuropathy), Foot Pain (bilateral), and Hand Pain (bilateral ) Last encounter: My last encounter with him was on 04/17/2020. Pertinent problems: [name] has Status post total replacement of hip; History of total right knee replacement (x2); Failed back surgical syndrome; Bilateral primary osteoarthritis of knee; Neuropathic pain; Spinal cord stimulator status; Pain syndrome, chronic; CKD (chronic kidney disease) stage 3, GFR 30-59 ml/min (HCC); and Diabetic neuropathy (HCC) on their pertinent problem list. Pain Assessment: Severity of Chronic pain is reported as a 6 /10. Location: Leg (see visit info) Left, Right/denies. Onset:  . Quality:  . Timing:  . Modifying factor(s):  Marland Kitchen Vitals:  height is 6' (1.829 m) and weight is 212 lb (96.2 kg). His temporal temperature is 97.1 F (36.2 C) (abnormal). His blood pressure is 126/71 and his pulse is 81. His respiration is 16 and oxygen saturation is 97%.   Reason for encounter: medication management.   Patient  presents today for medication management.  Unfortunately he sustained a fall yesterday that prompted him to go to the emergency department.  He states that he took a sleep aid over-the-counter the night before which probably contributed to his fall.  He states that he has done that medication away.  Patient had CT of his head and CT of his cervical spine done in the emergency department which was negative for any acute fractures or acute changes.  He has chronic cervical degenerative disc disease which was evident on his cervical spine CT.  He presents today for refill of his Lyrica which he takes 50 mg twice a day.  The medications helps with his lower extremity paresthesias and neuropathic pain.  He denies any side effects with Lyrica.  Continue as prescribed.  Patient is requesting to be connected with [facility] to optimize spinal cord stimulator trial settings.  He was unable to do that in the past.  I obtained the patient's consent to contact [employer] and have representative reach out to patient to schedule SCS programming/optimization visit.   ROS  Constitutional: Denies any fever or chills Gastrointestinal: No reported hemesis, hematochezia, vomiting, or acute GI distress Musculoskeletal: Bilateral neck pain, low back pain. Neurological: No reported episodes of acute onset apraxia, aphasia, dysarthria, agnosia, amnesia, paralysis, loss of coordination, or loss of consciousness  Medication Review  Alpha-Lipoic Acid, Apremilast, Glucosamine-Chondroit-Vit C-Mn, NON FORMULARY, allopurinol, calcium carbonate, cholecalciferol, fenofibrate, glimepiride, ketoconazole, melatonin, metFORMIN, mirabegron ER, multivitamin with minerals, olmesartan-hydrochlorothiazide, omeprazole, pregabalin, rOPINIRole, rosuvastatin, tamsulosin, temazepam, traZODone, and vitamin C  History Review  Allergy: [name] has No Known Allergies. Drug: [name]  reports no history of drug use. Alcohol:   reports no history of alcohol  use. Tobacco:  reports that he has never smoked. He has never used smokeless tobacco. Social: [name]  reports that he has never smoked. He has never used smokeless tobacco. He reports that he does not drink alcohol and does not use drugs. Medical:  has a past medical history of Arthritis, Depression, Diabetes mellitus without complication ([place]), Difficult intubation, GERD (gastroesophageal reflux disease), Heart murmur, basal cell carcinoma (2001, 2002), basal cell carcinoma (2004), basal cell carcinoma (2005), basal cell carcinoma (2006), basal cell carcinoma (2008), basal cell carcinoma (2009), basal cell carcinoma (2015), basal cell carcinoma (2016), basal cell carcinoma (2017), basal cell carcinoma (2020), Hypertension, Macular degeneration, Melanoma ([place]) (1973), Neuropathy, OSA on CPAP, Prostate cancer ([place]), Psoriasis, and Squamous cell carcinoma of skin (2006). Surgical: [name]  has a past surgical history that includes Pain pump implantation; Cataract extraction w/ intraocular lens  implant, bilateral; Total shoulder replacement (Left); Rotator cuff repair (Left); Lumbar spine surgery; Mouth surgery; prostate cancer; Skin cancer excision; Replacement total knee (Right); and Total hip arthroplasty (Right, 04/14/2017). Family: family history is not on file.  Laboratory Chemistry Profile   Renal Lab Results  Component Value Date   BUN 35 (H) 05/21/2020   CREATININE 1.71 (H) 05/21/2020   GFRAA >60 04/16/2017   GFRNONAA 39 (L) 05/21/2020     Hepatic Lab Results  Component Value Date   AST 23 05/21/2020   ALT 20 05/21/2020   ALBUMIN 3.7 05/21/2020   ALKPHOS 34 (L) 05/21/2020     Electrolytes Lab Results  Component Value Date   NA 140 05/21/2020   K 4.0 05/21/2020   CL 109 05/21/2020   CALCIUM 9.1 05/21/2020     Bone No results found for: VD25OH, VD125OH2TOT, NL8921JH4, RD4081KG8, 25OHVITD1, 25OHVITD2, 25OHVITD3, TESTOFREE, TESTOSTERONE    Inflammation (CRP: Acute Phase) (ESR: Chronic Phase) Lab Results  Component Value Date   CRP 1.1 (H) 04/02/2017   ESRSEDRATE 25 (H) 04/02/2017       Note: Above Lab results reviewed.  Recent Imaging Review  CT Cervical Spine Wo Contrast CLINICAL DATA:  Dizziness and fall.  EXAM: CT CERVICAL SPINE WITHOUT CONTRAST  TECHNIQUE: Multidetector CT imaging of the cervical spine was performed without intravenous contrast. Multiplanar CT image reconstructions were also generated.  COMPARISON:  July 01, 2018  FINDINGS: Alignment: Normal.  Skull base and vertebrae: No acute fracture. No primary bone lesion or focal pathologic process.  Soft tissues and spinal canal: No prevertebral fluid or swelling. No visible canal hematoma.  Disc levels: Moderate to marked severity endplate sclerosis is seen at the levels of C4-C5 and C6-C7. Moderate to marked severity intervertebral disc space narrowing is also seen at these levels.  Moderate severity bilateral multilevel facet joint hypertrophy is noted.  Upper chest: Negative.  Other: None.  IMPRESSION: 1. Moderate to marked severity degenerative changes of the cervical spine, as described above. 2. No evidence of an acute fracture or subluxation.  Electronically Signed   By: Virgina Norfolk M.D.   On: 05/21/2020 16:30 CT Head Wo Contrast CLINICAL DATA:  Dizziness and fall.  EXAM: CT HEAD WITHOUT CONTRAST  TECHNIQUE: Contiguous axial images were obtained from the base of the skull through the vertex without intravenous contrast.  COMPARISON:  None.  FINDINGS: Brain: There is mild cerebral atrophy with widening of the extra-axial spaces and ventricular dilatation. There are areas of decreased attenuation within the white matter tracts of the supratentorial brain, consistent with microvascular disease changes.  Vascular: No hyperdense vessel or unexpected calcification.  Skull: Normal. Negative for fracture or  focal lesion.  Sinuses/Orbits: No acute finding.  Other: None.  IMPRESSION: No acute intracranial pathology.  Electronically Signed   By: Virgina Norfolk M.D.   On: 05/21/2020 16:08 Note: Reviewed         Physical Exam  General appearance: Well nourished, well developed, and well hydrated. In no apparent acute distress Mental status: Alert, oriented x 3 (person, place, & time)       Respiratory: No evidence of acute respiratory distress Eyes: PERLA Vitals: BP 126/71 (BP Location: Right Arm, Patient Position: Sitting, Cuff Size: Normal)   Pulse 81   Temp (!) 97.1 F (36.2 C) (Temporal)   Resp 16   Ht 6' (1.829 m)   Wt 212 lb (96.2 kg)   SpO2 97%   BMI 28.75 kg/m  BMI: Estimated body mass index is 28.75 kg/m as calculated from the following:   Height as of this encounter: 6' (1.829 m).   Weight as of this encounter: 212 lb (96.2 kg). Ideal: Ideal body weight: 77.6 kg (171 lb 1.2 oz) Adjusted ideal body weight: 85 kg (187 lb 7.1 oz)    Lumbar Spine Area Exam  Skin & Axial Inspection:No masses, redness, or swelling, spinal cord stimulator IPG present Alignment:Symmetrical Functional GQQ:[id number] ROM Stability:No instability detected Muscle Tone/Strength:Functionally intact. No obvious neuro-muscular anomalies detected. Sensory (Neurological):Musculoskeletal pain pattern Palpation:Complains of area being tender to palpationlower sacral area  Provocative Tests: Hyperextension/rotation test:(+)due to pain. Lumbar quadrant test (Kemp's test):deferred today Lateral bending test:(+)due to pain. Patrick's Maneuver:deferred today FABER* test:deferred today S-I anterior distraction/compression test:deferred today S-I lateral compression test:deferred today S-I Thigh-thrust test:deferred today S-I Gaenslen's test:deferred today *(Flexion, ABduction and External  Rotation)  Gait & Posture Assessment  Ambulation:Patient ambulates using a cane Gait:Limited. Using assistive device to ambulate Posture:Difficulty standing up straight, due to pain  Lower Extremity Exam    Side:Right lower extremity  Side:Left lower extremity  Stability:No instability observed  Stability:No instability observed  Skin & Extremity Inspection:Skin color, temperature, and hair growth are WNL. No peripheral edema or cyanosis. No masses, redness, swelling, asymmetry, or associated skin lesions. No contractures.  Skin & Extremity Inspection:Skin color, temperature, and hair growth are WNL. No peripheral edema or cyanosis. No masses, redness, swelling, asymmetry, or associated skin lesions. No contractures.  Functional IWP:[id number] after treatment   Functional SNK:NLZJQBHA after treatment   Muscle Tone/Strength:Functionally intact. No obvious neuro-muscular anomalies detected.  Muscle Tone/Strength:Functionally intact. No obvious neuro-muscular anomalies detected.  Sensory (Neurological): Neuropathic pain  Sensory (Neurological): Neuropathic pain  DTR: Patellar:deferred today Achilles:deferred today Plantar:deferred today  DTR: Patellar:deferred today Achilles:deferred today Plantar:deferred today  Palpation:No palpable anomalies  Palpation:No palpable anomalies     Assessment   Status Diagnosis  Controlled Controlled Controlled 1. Neuropathic pain   2. Failed back surgical syndrome   3. Spinal cord stimulator status   4. Fall, initial encounter   5. Chronic pain of both knees   6. Chronic pain syndrome       Plan of Care   Pharmacotherapy (Medications Ordered): Meds ordered this encounter  Medications  . pregabalin (LYRICA) 50 MG capsule    Sig: Take 1 capsule (50 mg total) by mouth 2 (two) times daily.    Dispense:  60 capsule    Refill:  3    Fill one day early if  pharmacy is closed on scheduled refill date. May substitute for generic if available.   Per patient's consent, will reach out to [employer] to  have them contact patient to optimize spinal cord stimulator settings.  Follow-up plan:   Return in about 4 months (around 09/19/2020).   Recent Visits Date Type Provider Dept  04/17/20 Office Visit Gillis Santa, MD [facility]  Showing recent visits within past 90 days and meeting all other requirements Today's Visits Date Type Provider Dept  05/22/20 Office Visit Gillis Santa, MD [facility]  Showing today's visits and meeting all other requirements Future Appointments No visits were found meeting these conditions. Showing future appointments within next 90 days and meeting all other requirements  I discussed the assessment and treatment plan with the patient. The patient was provided an opportunity to ask questions and all were answered. The patient agreed with the plan and demonstrated an understanding of the instructions.  Patient advised to call back or seek an in-person evaluation if the symptoms or condition worsens.  Duration of encounter: 20 minutes.  Note by: Gillis Santa, MD Date: 05/22/2020; Time: 12:00 PM

## 2020-05-22 NOTE — ED Provider Notes (Signed)
[facility] Emergency Department Provider Note  ____________________________________________   I have reviewed the triage vital signs and the nursing notes.   HISTORY  Chief Complaint Dizziness  History limited by: Not Limited   HPI Andrew Obrien is a 85 y.o. male who presents to the emergency department today because of concerns for dizziness, falls and headache.  Patient states that the symptoms were present yesterday when he was seen in the emergency department.  They started again this morning.  Stated he checked his sugars this morning and they were 47.  When asked he states he did not do anything particular about this finding.  The patient states that he did fall multiple times today but denies any significant traumatic injury from the falls.  Patient denies any recent fevers.  Denies any urinary changes.   Records reviewed. Per medical record review patient has a history of ER visit yesterday for similar symptoms.  Found to be hypoglycemic.  Past Medical History:  Diagnosis Date  . Arthritis   . Depression   . Diabetes mellitus without complication ([place])   . Difficult intubation   . GERD (gastroesophageal reflux disease)   . Heart murmur   . Hx of basal cell carcinoma 2001, 2002   R cheek txted in [place]  . Hx of basal cell carcinoma 2004   L cheek txted in [place]  . Hx of basal cell carcinoma 2005   Forehead txted in [place]  . Hx of basal cell carcinoma 2006   Back, txted in [place]  . Hx of basal cell carcinoma 2008   R post shoulder txted in [place]  . Hx of basal cell carcinoma 2009   Face and Back, L neck txted in [place]  . Hx of basal cell carcinoma 2015   L temple, R ear x 2 txted by Dr. Blane Ohara [place]  . Hx of basal cell carcinoma 2016   Back txted by Dr. Rayburn Felt [place]  . Hx of basal cell carcinoma 2017   L face x 2 txted by Dr. Blane Ohara [place]  . Hx of basal cell carcinoma 2020   Multiple face, back, shoulders txted by Dr. Evorn Gong  .  Hypertension   . Macular degeneration    Right  . Melanoma ([place]) 1973   R cheek txted in [place]  . Neuropathy   . OSA on CPAP   . Prostate cancer ([place])   . Psoriasis   . Squamous cell carcinoma of skin 2006   SCCIS back txted in [place]    Patient Active Problem List   Diagnosis Date Noted  . Pain due to onychomycosis of toenails of both feet 10/18/2019  . Diabetic neuropathy ([place]) 10/18/2019  . Systolic murmur 46/96/2952  . Anemia of chronic disease 04/30/2019  . CKD (chronic kidney disease) stage 3, GFR 30-59 ml/min (HCC) 12/28/2018  . Psoriasis 08/25/2018  . Personal history of gout 04/13/2018  . History of total right knee replacement (x2) 01/06/2018  . 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 Bilateral primary osteoarthritis of knee 01/06/2018  . Neuropathic pain 01/06/2018  . Spinal cord stimulator status 01/06/2018  . Pain syndrome, chronic 01/06/2018  . Right leg numbness 12/10/2017  . Right leg weakness 12/10/2017  . DNR (do not resuscitate) 11/28/2017  . Encounter for general adult medical examination without abnormal findings 11/28/2017  . Acquired trigger finger of left ring finger 10/28/2017  . Status post total replacement of hip 04/14/2017  . Mixed hyperlipidemia 03/03/2017  Past Surgical History:  Procedure Laterality Date  . CATARACT EXTRACTION W/ INTRAOCULAR LENS  IMPLANT, BILATERAL    . LUMBAR SPINE SURGERY    . MOUTH SURGERY    . PAIN PUMP IMPLANTATION    . prostate cancer    . REPLACEMENT TOTAL KNEE Right    x 2  . ROTATOR CUFF REPAIR Left   . SKIN CANCER EXCISION    . TOTAL HIP ARTHROPLASTY Right 04/14/2017   Procedure: TOTAL HIP ARTHROPLASTY;  Surgeon: Dereck Leep, MD;  Location: [facility] ORS;  Service: Orthopedics;  Laterality: Right;  . TOTAL SHOULDER REPLACEMENT Left     Prior to Admission medications   Medication Sig Start Date End Date Taking? Authorizing Provider  allopurinol (ZYLOPRIM) 300 MG tablet TAKE 1 TABLET BY MOUTH ONCE DAILY  08/05/19   [provider]  Alpha-Lipoic Acid 600 MG CAPS Take 1 capsule (600 mg total) by mouth daily. 04/17/20 07/16/20  Gillis Santa, MD  Apremilast (OTEZLA) 30 MG TABS Take 1 tablet (30 mg total) by mouth 2 (two) times daily. 12/16/19   Ralene Bathe, MD  Apremilast (OTEZLA) 30 MG TABS Take 1 tablet (30 mg total) by mouth 2 (two) times daily. Patient not taking: Reported on 05/22/2020 05/08/20   Ralene Bathe, MD  calcium carbonate (OS-CAL) 1250 (500 Ca) MG chewable tablet Chew 1 tablet by mouth daily.    [provider]  cholecalciferol (VITAMIN D) 1000 units tablet Take 1,000 Units by mouth 2 (two) times daily.    [provider]  fenofibrate 160 MG tablet Take 1 tablet by mouth daily. 10/19/19   [provider]  glimepiride (AMARYL) 2 MG tablet Take by mouth. 06/16/19   [provider]  Glucosamine-Chondroit-Vit C-Mn (GLUCOSAMINE CHONDR 1500 COMPLX PO) Take 1 tablet by mouth 2 (two) times daily.    [provider]  ketoconazole (NIZORAL) 2 % cream Apply  a small amount to affected area as directed  apply to scaly areas between eyes, and around nose at bedtime on monday, wednesday and friday Patient not taking: Reported on 04/17/2020 08/11/19   [provider]  Melatonin 5 MG TABS Take 1 tablet by mouth at bedtime.    [provider]  metFORMIN (GLUCOPHAGE) 1000 MG tablet TAKE 1 TABLET BY MOUTH  TWICE DAILY WITH MEALS 09/01/19   [provider]  mirabegron ER (MYRBETRIQ) 50 MG TB24 tablet Take 50 mg by mouth daily.    [provider]  Multiple Vitamins-Minerals (MULTIVITAMIN WITH MINERALS) tablet Take 1 tablet by mouth daily.    [provider]  NON FORMULARY Take 1 tablet by mouth daily. [place]    [provider]  olmesartan-hydrochlorothiazide (BENICAR HCT) 40-12.5 MG tablet Take by mouth. 08/05/19   [provider]  omeprazole (PRILOSEC) 20 MG capsule TAKE 1 CAPSULE BY  MOUTH  ONCE DAILY 05/25/19   [provider]  pregabalin (LYRICA) 50 MG capsule Take 1 capsule (50 mg total) by mouth 2 (two) times daily. 05/22/20 09/19/20  Gillis Santa, MD  rOPINIRole (REQUIP) 1 MG tablet TAKE 1 TABLET BY MOUTH  TWICE DAILY 05/25/19   [provider]  rosuvastatin (CRESTOR) 10 MG tablet TAKE 1 TABLET BY MOUTH ONCE DAILY 09/01/19   [provider]  tamsulosin (FLOMAX) 0.4 MG CAPS capsule Take 0.4 mg by mouth daily.    [provider]  temazepam (RESTORIL) 7.5 MG capsule Take 7.5 mg by mouth at bedtime. Patient not taking: Reported on 04/17/2020  [provider]  traZODone (DESYREL) 50 MG tablet TAKE 2 TABLETS BY MOUTH AT  NIGHT AS NEEDED FOR SLEEP 08/05/19   [provider]  vitamin C (ASCORBIC ACID) 500 MG tablet Take 500 mg by mouth daily.    [provider]    Allergies Patient has no known allergies.  No family history on file.  Social History Social History   Tobacco Use  . Smoking status: Never Smoker  . Smokeless tobacco: Never Used  Vaping Use  . Vaping Use: Never used  Substance Use Topics  . Alcohol use: No  . Drug use: No    Review of Systems Constitutional: No fever/chills Eyes: No visual changes. ENT: No sore throat. Cardiovascular: Denies chest pain. Respiratory: Denies shortness of breath. Gastrointestinal: No abdominal pain.  No nausea, no vomiting.  No diarrhea.   Genitourinary: Negative for dysuria. Musculoskeletal: Negative for back pain. Skin: Negative for rash. Neurological: Positive for headache and dizziness. ____________________________________________   PHYSICAL EXAM:  VITAL SIGNS: ED Triage Vitals  Enc Vitals Group     BP 05/22/20 1616 (!) 113/49     Pulse Rate 05/22/20 1616 76     Resp 05/22/20 1616 18     Temp 05/22/20 1616 98 F (36.7 C)     Temp src --      SpO2 05/22/20 1615 98 %     Weight 05/22/20 1617 212 lb (96.2 kg)     Height 05/22/20 1617 6'  (1.829 m)     Head Circumference --      Peak Flow --      Pain Score 05/22/20 1617 0   Constitutional: Alert and oriented.  Eyes: Conjunctivae are normal.  ENT      Head: Normocephalic and atraumatic.      Nose: No congestion/rhinnorhea.      Mouth/Throat: Mucous membranes are moist.      Neck: No stridor. Hematological/Lymphatic/Immunilogical: No cervical lymphadenopathy. Cardiovascular: Normal rate, regular rhythm.  No murmurs, rubs, or gallops.  Respiratory: Normal respiratory effort without tachypnea nor retractions. Breath sounds are clear and equal bilaterally. No wheezes/rales/rhonchi. Gastrointestinal: Soft and non tender. No rebound. No guarding.  Genitourinary: Deferred Musculoskeletal: Normal range of motion in all extremities. No lower extremity edema. Neurologic:  Normal speech and language. No gross focal neurologic deficits are appreciated.  Skin:  Skin is warm, dry and intact. No rash noted. Psychiatric: Mood and affect are normal. Speech and behavior are normal. Patient exhibits appropriate insight and judgment.  ____________________________________________    LABS (pertinent positives/negatives)  UA clear, not consistent with infection COVID negative CBC wbc 9.1, hgb 10.4, plt 270 BMP na 138, k 4.3, glu 38, cr 1.47  ____________________________________________   EKG  None  ____________________________________________    RADIOLOGY  None  ____________________________________________   PROCEDURES  Procedures  ____________________________________________   INITIAL IMPRESSION / ASSESSMENT AND PLAN / ED COURSE  Pertinent labs & imaging results that were available during my care of the patient were reviewed by me and considered in my medical decision making (see chart for details).   Patient presented to the emergency department today because of concern for headache and dizziness. Patient did have some falls but not significant traumatic injury.  Was seen yesterday for the same symptoms and found to be hypoglycemic. Patient hypoglycemic again today. Patient was given food but continued to be hypoglycemic. Because of this will plan on starting patient on dextrose infusion and admission. Discussed plan with patient.   ____________________________________________  FINAL CLINICAL IMPRESSION(S) / ED DIAGNOSES  Final diagnoses:  Hypoglycemia     Note: This dictation was prepared with Dragon dictation. Any transcriptional errors that result from this process are unintentional     Nance Pear, MD 05/22/20 2000

## 2020-05-22 NOTE — Assessment & Plan Note (Addendum)
Continue with IVF. Repeat BMP in AM. Hold HCTZ/ARB.

## 2020-05-23 DIAGNOSIS — E782 Mixed hyperlipidemia: Secondary | ICD-10-CM | POA: Diagnosis present

## 2020-05-23 DIAGNOSIS — I129 Hypertensive chronic kidney disease with stage 1 through stage 4 chronic kidney disease, or unspecified chronic kidney disease: Secondary | ICD-10-CM | POA: Diagnosis present

## 2020-05-23 DIAGNOSIS — K219 Gastro-esophageal reflux disease without esophagitis: Secondary | ICD-10-CM | POA: Diagnosis present

## 2020-05-23 DIAGNOSIS — N179 Acute kidney failure, unspecified: Secondary | ICD-10-CM | POA: Diagnosis present

## 2020-05-23 DIAGNOSIS — E1122 Type 2 diabetes mellitus with diabetic chronic kidney disease: Secondary | ICD-10-CM | POA: Diagnosis present

## 2020-05-23 DIAGNOSIS — Z96612 Presence of left artificial shoulder joint: Secondary | ICD-10-CM | POA: Diagnosis present

## 2020-05-23 DIAGNOSIS — I493 Ventricular premature depolarization: Secondary | ICD-10-CM | POA: Diagnosis present

## 2020-05-23 DIAGNOSIS — Z96641 Presence of right artificial hip joint: Secondary | ICD-10-CM | POA: Diagnosis present

## 2020-05-23 DIAGNOSIS — G4733 Obstructive sleep apnea (adult) (pediatric): Secondary | ICD-10-CM | POA: Diagnosis present

## 2020-05-23 DIAGNOSIS — J8410 Pulmonary fibrosis, unspecified: Secondary | ICD-10-CM | POA: Diagnosis present

## 2020-05-23 DIAGNOSIS — Z66 Do not resuscitate: Secondary | ICD-10-CM | POA: Diagnosis present

## 2020-05-23 DIAGNOSIS — E1165 Type 2 diabetes mellitus with hyperglycemia: Secondary | ICD-10-CM | POA: Diagnosis present

## 2020-05-23 DIAGNOSIS — E1140 Type 2 diabetes mellitus with diabetic neuropathy, unspecified: Secondary | ICD-10-CM | POA: Diagnosis present

## 2020-05-23 DIAGNOSIS — Z6828 Body mass index (BMI) 28.0-28.9, adult: Secondary | ICD-10-CM | POA: Diagnosis not present

## 2020-05-23 DIAGNOSIS — N1831 Chronic kidney disease, stage 3a: Secondary | ICD-10-CM | POA: Diagnosis present

## 2020-05-23 DIAGNOSIS — E11649 Type 2 diabetes mellitus with hypoglycemia without coma: Secondary | ICD-10-CM | POA: Diagnosis present

## 2020-05-23 DIAGNOSIS — Z20822 Contact with and (suspected) exposure to covid-19: Secondary | ICD-10-CM | POA: Diagnosis present

## 2020-05-23 DIAGNOSIS — Z85820 Personal history of malignant melanoma of skin: Secondary | ICD-10-CM | POA: Diagnosis not present

## 2020-05-23 DIAGNOSIS — Z8546 Personal history of malignant neoplasm of prostate: Secondary | ICD-10-CM | POA: Diagnosis not present

## 2020-05-23 DIAGNOSIS — E860 Dehydration: Secondary | ICD-10-CM | POA: Diagnosis present

## 2020-05-23 DIAGNOSIS — E669 Obesity, unspecified: Secondary | ICD-10-CM | POA: Diagnosis present

## 2020-05-23 DIAGNOSIS — E162 Hypoglycemia, unspecified: Secondary | ICD-10-CM | POA: Diagnosis present

## 2020-05-23 DIAGNOSIS — D631 Anemia in chronic kidney disease: Secondary | ICD-10-CM | POA: Diagnosis present

## 2020-05-23 DIAGNOSIS — Z96651 Presence of right artificial knee joint: Secondary | ICD-10-CM | POA: Diagnosis present

## 2020-05-23 DIAGNOSIS — M109 Gout, unspecified: Secondary | ICD-10-CM | POA: Diagnosis present

## 2020-05-23 LAB — CBC WITH DIFFERENTIAL/PLATELET
Abs Immature Granulocytes: 0.06 10*3/uL (ref 0.00–0.07)
Basophils Absolute: 0.1 10*3/uL (ref 0.0–0.1)
Basophils Relative: 1 %
Eosinophils Absolute: 0.6 10*3/uL — ABNORMAL HIGH (ref 0.0–0.5)
Eosinophils Relative: 5 %
HCT: 33.8 % — ABNORMAL LOW (ref 39.0–52.0)
Hemoglobin: 11.4 g/dL — ABNORMAL LOW (ref 13.0–17.0)
Immature Granulocytes: 1 %
Lymphocytes Relative: 15 %
Lymphs Abs: 1.8 10*3/uL (ref 0.7–4.0)
MCH: 31.8 pg (ref 26.0–34.0)
MCHC: 33.7 g/dL (ref 30.0–36.0)
MCV: 94.4 fL (ref 80.0–100.0)
Monocytes Absolute: 0.8 10*3/uL (ref 0.1–1.0)
Monocytes Relative: 7 %
Neutro Abs: 8.5 10*3/uL — ABNORMAL HIGH (ref 1.7–7.7)
Neutrophils Relative %: 71 %
Platelets: 283 10*3/uL (ref 150–400)
RBC: 3.58 MIL/uL — ABNORMAL LOW (ref 4.22–5.81)
RDW: 13.3 % (ref 11.5–15.5)
WBC: 11.7 10*3/uL — ABNORMAL HIGH (ref 4.0–10.5)
nRBC: 0 % (ref 0.0–0.2)

## 2020-05-23 LAB — GLUCOSE, CAPILLARY
Glucose-Capillary: 101 mg/dL — ABNORMAL HIGH (ref 70–99)
Glucose-Capillary: 136 mg/dL — ABNORMAL HIGH (ref 70–99)
Glucose-Capillary: 144 mg/dL — ABNORMAL HIGH (ref 70–99)
Glucose-Capillary: 210 mg/dL — ABNORMAL HIGH (ref 70–99)
Glucose-Capillary: 75 mg/dL (ref 70–99)

## 2020-05-23 LAB — COMPREHENSIVE METABOLIC PANEL
ALT: 18 U/L (ref 0–44)
AST: 24 U/L (ref 15–41)
Albumin: 3.5 g/dL (ref 3.5–5.0)
Alkaline Phosphatase: 34 U/L — ABNORMAL LOW (ref 38–126)
Anion gap: 10 (ref 5–15)
BUN: 24 mg/dL — ABNORMAL HIGH (ref 8–23)
CO2: 23 mmol/L (ref 22–32)
Calcium: 9.3 mg/dL (ref 8.9–10.3)
Chloride: 106 mmol/L (ref 98–111)
Creatinine, Ser: 1.44 mg/dL — ABNORMAL HIGH (ref 0.61–1.24)
GFR, Estimated: 48 mL/min — ABNORMAL LOW (ref >60)
Glucose, Bld: 67 mg/dL — ABNORMAL LOW (ref 70–99)
Potassium: 4.3 mmol/L (ref 3.5–5.1)
Sodium: 139 mmol/L (ref 135–145)
Total Bilirubin: 0.6 mg/dL (ref 0.3–1.2)
Total Protein: 6.1 g/dL — ABNORMAL LOW (ref 6.5–8.1)

## 2020-05-23 LAB — HEMOGLOBIN A1C
Hgb A1c MFr Bld: 5.8 % — ABNORMAL HIGH (ref 4.8–5.6)
Mean Plasma Glucose: 119.76 mg/dL

## 2020-05-23 MED ORDER — LIVING WELL WITH DIABETES BOOK
Freq: Once | Status: DC
  Filled 2020-05-23: qty 1

## 2020-05-23 NOTE — Progress Notes (Signed)
PROGRESS NOTE    Andrew Obrien  YWV:[id number] DOB: August 17, 1934 DOA: 05/22/2020 PCP: Dion Body, MD   Brief Narrative:  HPI: 85 yo WM with hx of type 2 DM, HTN, diabetic neuropathy, presents to ER for the 2nd time in the last 2 days for hypoglycemia. Pt seen yesterday for FSBS in the 48s. Pt given IVF and observed in the ER. FSBS stabilized and pt discharged to home. Pt states he was not told to stop his amaryl or metformin. Pt presented again today to ER for headache, dizziness and fall. FSBS in the ER today was 52.  This dropped again about 1 hour later to 42 and then to 38. No seizure activity. Pt requiring D10W IVF to keep FSBS from dropping.  Pt without any chest pain or SOB. No abd pain. No N/V. Pt states he eats normally at home. Pt lives at home with his wife.  Due to persistent hypoglycemia requiring IVF dextrose fluids, [facility] hospitalist contacted for admission.    ED Course: pt given IVF with dextrose due to persistent hypoglycemia.  Assessment & Plan:   Principal Problem:   Diabetic hypoglycemia ([place]) Active Problems:   Neuropathic pain   CKD (chronic kidney disease) stage 3, GFR 30-59 ml/min (HCC)   DNR (do not resuscitate)/DNI(Do Not Intubate)   AKI (acute kidney injury) ([place])   Diabetic hypoglycemia ([place]) in a patient with type 2 diabetes mellitus: Feeling much better.  No more symptoms.  Blood sugar now within normal range while he is on dextrose fluids.  Continue this and monitor every 4 hours.  Hopefully will discontinue later today.  Would like to keep him under observation overnight as hypoglycemia secondary to oral hypoglycemic agents/sulfonylureas can linger for hours and this is a second admission with the last 2 days.  Will likely need Amaryl stopped at discharge.  Also, he showed me his log book and it appears that patient has waxing and waning hyperglycemia alternating with hypoglycemia.  When asked about his diet, he does not seem to have any clue  what he is supposed to avoid as a diabetic.  Will consult diabetes coordinator to educate patient.  AKI (acute kidney injury) ([place]) Could very well be due to dehydration.  Creatinine improving but still elevated.  Continue IV fluids and repeat labs in the morning.  Neuropathic pain Continue lyrica.  DVT prophylaxis: enoxaparin (LOVENOX) injection 40 mg Start: 05/22/20 2200 SCDs Start: 05/22/20 1944   Code Status: DNR  Family Communication:  None present at bedside.  Plan of care discussed with patient in length and he verbalized understanding and agreed with it.  Status is: Observation  The patient remains OBS appropriate and will d/c before 2 midnights.  Dispo: The patient is from: Home              Anticipated d/c is to: Home              Anticipated d/c date is: 1 day              Patient currently is not medically stable to d/c.        Estimated body mass index is 28.75 kg/m as calculated from the following:   Height as of this encounter: 6' (1.829 m).   Weight as of this encounter: 96.2 kg.      Nutritional status:               Consultants:   None  Procedures:   None  Antimicrobials:  Anti-infectives (  From admission, onward)   None         Subjective: Seen and examined.  Fully alert and oriented.  No complaints.  Objective: Vitals:   05/22/20 1617 05/22/20 2119 05/23/20 0426 05/23/20 0808  BP:  (!) 125/55 (!) 141/67 (!) 137/97  Pulse:  62 80 80  Resp:  18 16 17   Temp:  98.1 F (36.7 C) 98 F (36.7 C) (!) 97.4 F (36.3 C)  TempSrc:  Oral Oral Oral  SpO2:  100% 96% 96%  Weight: 96.2 kg     Height: 6' (1.829 m)       Intake/Output Summary (Last 24 hours) at 05/23/2020 0941 Last data filed at 05/23/2020 0300 Gross per 24 hour  Intake 265.34 ml  Output --  Net 265.34 ml   Filed Weights   05/22/20 1617  Weight: 96.2 kg    Examination:  General exam: Appears calm and comfortable  Respiratory system: Clear to  auscultation. Respiratory effort normal. Cardiovascular system: S1 & S2 heard, RRR. No JVD, murmurs, rubs, gallops or clicks. No pedal edema. Gastrointestinal system: Abdomen is nondistended, soft and nontender. No organomegaly or masses felt. Normal bowel sounds heard. Central nervous system: Alert and oriented. No focal neurological deficits. Extremities: Symmetric 5 x 5 power. Skin: No rashes, lesions or ulcers Psychiatry: Judgement and insight appear normal. Mood & affect appropriate.    Data Reviewed: I have personally reviewed following labs and imaging studies  CBC: Recent Labs  Lab 05/21/20 1514 05/22/20 1639 05/23/20 0434  WBC 8.3 9.1 11.7*  NEUTROABS  --   --  8.5*  HGB 11.1* 10.4* 11.4*  HCT 34.3* 32.5* 33.8*  MCV 95.5 97.0 94.4  PLT 279 270 440   Basic Metabolic Panel: Recent Labs  Lab 05/21/20 1514 05/22/20 1639 05/23/20 0434  NA 140 138 139  K 4.0 4.3 4.3  CL 109 105 106  CO2 23 24 23   GLUCOSE 52* 38* 67*  BUN 35* 27* 24*  CREATININE 1.71* 1.47* 1.44*  CALCIUM 9.1 9.3 9.3   GFR: Estimated Creatinine Clearance: 46.7 mL/min (A) (by C-G formula based on SCr of 1.44 mg/dL (H)). Liver Function Tests: Recent Labs  Lab 05/21/20 1514 05/23/20 0434  AST 23 24  ALT 20 18  ALKPHOS 34* 34*  BILITOT 0.6 0.6  PROT 6.6 6.1*  ALBUMIN 3.7 3.5   No results for input(s): LIPASE, AMYLASE in the last 168 hours. No results for input(s): AMMONIA in the last 168 hours. Coagulation Profile: No results for input(s): INR, PROTIME in the last 168 hours. Cardiac Enzymes: No results for input(s): CKTOTAL, CKMB, CKMBINDEX, TROPONINI in the last 168 hours. BNP (last 3 results) No results for input(s): PROBNP in the last 8760 hours. HbA1C: No results for input(s): HGBA1C in the last 72 hours. CBG: Recent Labs  Lab 05/22/20 1631 05/22/20 1731 05/22/20 1837 05/22/20 2234 05/23/20 0759  GLUCAP 42* 56* 77 107* 75   Lipid Profile: No results for input(s): CHOL, HDL,  LDLCALC, TRIG, CHOLHDL, LDLDIRECT in the last 72 hours. Thyroid Function Tests: No results for input(s): TSH, T4TOTAL, FREET4, T3FREE, THYROIDAB in the last 72 hours. Anemia Panel: No results for input(s): VITAMINB12, FOLATE, FERRITIN, TIBC, IRON, RETICCTPCT in the last 72 hours. Sepsis Labs: No results for input(s): PROCALCITON, LATICACIDVEN in the last 168 hours.  Recent Results (from the past 240 hour(s))  Resp Panel by RT-PCR (Flu A&B, Covid) Nasopharyngeal Swab     Status: None   Collection Time: 05/22/20  6:03 PM  Specimen: Nasopharyngeal Swab; Nasopharyngeal(NP) swabs in vial transport medium  Result Value Ref Range Status   SARS Coronavirus 2 by RT PCR NEGATIVE NEGATIVE Final    Comment: (NOTE) SARS-CoV-2 target nucleic acids are NOT DETECTED.  The SARS-CoV-2 RNA is generally detectable in upper respiratory specimens during the acute phase of infection. The lowest concentration of SARS-CoV-2 viral copies this assay can detect is 138 copies/mL. A negative result does not preclude SARS-Cov-2 infection and should not be used as the sole basis for treatment or other patient management decisions. A negative result may occur with  improper specimen collection/handling, submission of specimen other than nasopharyngeal swab, presence of viral mutation(s) within the areas targeted by this assay, and inadequate number of viral copies(<138 copies/mL). A negative result must be combined with clinical observations, patient history, and epidemiological information. The expected result is Negative.  Fact Sheet for Patients:  EntrepreneurPulse.com.au  Fact Sheet for Healthcare Providers:  IncredibleEmployment.be  This test is no t yet approved or cleared by the [place] FDA and  has been authorized for detection and/or diagnosis of SARS-CoV-2 by FDA under an Emergency Use Authorization (EUA). This EUA will remain  in effect (meaning this test  can be used) for the duration of the COVID-19 declaration under Section 564(b)(1) of the Act, 21 U.S.C.section 360bbb-3(b)(1), unless the authorization is terminated  or revoked sooner.       Influenza A by PCR NEGATIVE NEGATIVE Final   Influenza B by PCR NEGATIVE NEGATIVE Final    Comment: (NOTE) The Xpert Xpress SARS-CoV-2/FLU/RSV plus assay is intended as an aid in the diagnosis of influenza from Nasopharyngeal swab specimens and should not be used as a sole basis for treatment. Nasal washings and aspirates are unacceptable for Xpert Xpress SARS-CoV-2/FLU/RSV testing.  Fact Sheet for Patients: EntrepreneurPulse.com.au  Fact Sheet for Healthcare Providers: IncredibleEmployment.be  This test is not yet approved or cleared by the [place] FDA and has been authorized for detection and/or diagnosis of SARS-CoV-2 by FDA under an Emergency Use Authorization (EUA). This EUA will remain in effect (meaning this test can be used) for the duration of the COVID-19 declaration under Section 564(b)(1) of the Act, 21 U.S.C. section 360bbb-3(b)(1), unless the authorization is terminated or revoked.  Performed at [facility], [address] [place], [place] [address]       Radiology Studies: CT Head Wo Contrast  Result Date: 05/21/2020 CLINICAL DATA:  Dizziness and fall. EXAM: CT HEAD WITHOUT CONTRAST TECHNIQUE: Contiguous axial images were obtained from the base of the skull through the vertex without intravenous contrast. COMPARISON:  None. FINDINGS: Brain: There is mild cerebral atrophy with widening of the extra-axial spaces and ventricular dilatation. There are areas of decreased attenuation within the white matter tracts of the supratentorial brain, consistent with microvascular disease changes. Vascular: No hyperdense vessel or unexpected calcification. Skull: Normal. Negative for fracture or focal lesion. Sinuses/Orbits: No acute  finding. Other: None. IMPRESSION: No acute intracranial pathology. Electronically Signed   By: Virgina Norfolk M.D.   On: 05/21/2020 16:08   CT Cervical Spine Wo Contrast  Result Date: 05/21/2020 CLINICAL DATA:  Dizziness and fall. EXAM: CT CERVICAL SPINE WITHOUT CONTRAST TECHNIQUE: Multidetector CT imaging of the cervical spine was performed without intravenous contrast. Multiplanar CT image reconstructions were also generated. COMPARISON:  July 01, 2018 FINDINGS: Alignment: Normal. Skull base and vertebrae: No acute fracture. No primary bone lesion or focal pathologic process. Soft tissues and spinal canal: No prevertebral fluid or swelling. No  visible canal hematoma. Disc levels: Moderate to marked severity endplate sclerosis is seen at the levels of C4-C5 and C6-C7. Moderate to marked severity intervertebral disc space narrowing is also seen at these levels. Moderate severity bilateral multilevel facet joint hypertrophy is noted. Upper chest: Negative. Other: None. IMPRESSION: 1. Moderate to marked severity degenerative changes of the cervical spine, as described above. 2. No evidence of an acute fracture or subluxation. Electronically Signed   By: Virgina Norfolk M.D.   On: 05/21/2020 16:30    Scheduled Meds: . enoxaparin (LOVENOX) injection  40 mg Subcutaneous Q24H  . mirabegron ER  50 mg Oral Daily  . pantoprazole  40 mg Oral Daily  . pregabalin  50 mg Oral BID  . rOPINIRole  1 mg Oral BID  . tamsulosin  0.4 mg Oral Daily   Continuous Infusions:   LOS: 0 days   Time spent: 32 minutes   Darliss Cheney, MD [facility]  05/23/2020, 9:41 AM   To contact the attending provider between 7A-7P or the covering provider during after hours 7P-7A, please log into the web site [email or web address].

## 2020-05-23 NOTE — Progress Notes (Signed)
Inpatient Diabetes Program Recommendations  AACE/ADA: New Consensus Statement on Inpatient Glycemic Control (2015)  Target Ranges:  Prepandial:   less than 140 mg/dL      Peak postprandial:   less than 180 mg/dL (1-2 hours)      Critically ill patients:  140 - 180 mg/dL   Lab Results  Component Value Date   GLUCAP 75 05/23/2020   HGBA1C 5.8 (H) 05/22/2020    Review of Glycemic Control Results for Andrew Obrien, Andrew Obrien (MRN [id number]) as of 05/23/2020 10:18  Ref. Range 05/22/2020 16:31 05/22/2020 17:31 05/22/2020 18:37 05/22/2020 22:34 05/23/2020 07:59  Glucose-Capillary Latest Ref Range: 70 - 99 mg/dL 42 (LL) 56 (L) 77 107 (H) 75   Diabetes history: DM2 Outpatient Diabetes medications: Amaryl 2 mg qd + Metformin 1 gm bid Current orders for Inpatient glycemic control: None  Inpatient Diabetes Program Recommendations:   Noted patient was readmitted with hypoglycemia. Consider discharge home off of DM medications and follow up with his PCP for further diabetes needs. Secure chat sent to Dr. Doristine Bosworth.  Thank you, Nani Gasser. Jermine Bibbee, RN, MSN, CDE  Diabetes Coordinator Inpatient Glycemic Control Team Team Pager [phone] (8am-5pm) 05/23/2020 10:20 AM

## 2020-05-24 DIAGNOSIS — N1831 Chronic kidney disease, stage 3a: Secondary | ICD-10-CM

## 2020-05-24 LAB — BASIC METABOLIC PANEL
Anion gap: 11 (ref 5–15)
BUN: 23 mg/dL (ref 8–23)
CO2: 22 mmol/L (ref 22–32)
Calcium: 9.1 mg/dL (ref 8.9–10.3)
Chloride: 105 mmol/L (ref 98–111)
Creatinine, Ser: 1.53 mg/dL — ABNORMAL HIGH (ref 0.61–1.24)
GFR, Estimated: 45 mL/min — ABNORMAL LOW (ref >60)
Glucose, Bld: 156 mg/dL — ABNORMAL HIGH (ref 70–99)
Potassium: 4.9 mmol/L (ref 3.5–5.1)
Sodium: 138 mmol/L (ref 135–145)

## 2020-05-24 LAB — GLUCOSE, CAPILLARY: Glucose-Capillary: 152 mg/dL — ABNORMAL HIGH (ref 70–99)

## 2020-05-24 NOTE — Discharge Summary (Signed)
Physician Discharge Summary  Patient ID: Andrew Obrien MRN: [id number] DOB/AGE: 08/06/1934 85 y.o.  Admit date: 05/22/2020 Discharge date: 05/24/2020  Admission Diagnoses:  Discharge Diagnoses:  Principal Problem:   Diabetic hypoglycemia ([place]) Active Problems:   Neuropathic pain   CKD (chronic kidney disease) stage 3, GFR 30-59 ml/min (HCC)   DNR (do not resuscitate)/DNI(Do Not Intubate)   AKI (acute kidney injury) ([place])   Hypoglycemia Acute on chronic kidney disease stage IIIa.  Discharged Condition: good  Hospital Course:  85 yo WM with hx of type 2 DM, HTN, diabetic neuropathy, presents to ER for the 2nd time in the last 2 days for hypoglycemia. Pt seen yesterday for FSBS in the 46s. Pt given IVF and observed in the ER. FSBS stabilized and pt discharged to home. Pt states he was not told to stop his amaryl or metformin. Pt presented again today to ER for headache, dizziness and fall. FSBS in the ER today was 52. This dropped again about 1 hour later to 42 and then to 38. No seizure activity. Pt requiring D10W IVF to keep FSBS from dropping.  Pt without any chest pain or SOB. No abd pain. No N/V. Pt states he eats normally at home. Pt lives at home with his wife.  Due to persistent hypoglycemia requiring IVF dextrose fluids, [facility] hospitalist contacted for admission.   ED Course:pt given IVF with dextrose due to persistent hypoglycemia.  Patient condition improved.  D10 was discontinued on 11/30.  Glucose has been stable since then.  At this point, patient is medically stable to be discharged.  I will discontinue Amaryl, continue Metformin.  Patient be followed by PCP as outpatient.    Consults: None  Significant Diagnostic Studies:   Treatments: D10 infusion  Discharge Exam: Blood pressure (!) 152/119, pulse (!) 104, temperature 98.1 F (36.7 C), resp. rate 20, height 6' (1.829 m), weight 96.2 kg, SpO2 92 %. General appearance: alert and cooperative Resp: clear  to auscultation bilaterally Cardio: regular rate and rhythm, S1, S2 normal, no murmur, click, rub or gallop GI: soft, non-tender; bowel sounds normal; no masses,  no organomegaly Extremities: extremities normal, atraumatic, no cyanosis or edema  Disposition: Discharge disposition: 01-Home or Self Care       Discharge Instructions    Diet - low sodium heart healthy   Complete by: As directed    Increase activity slowly   Complete by: As directed      Allergies as of 05/24/2020   No Known Allergies     Medication List    STOP taking these medications   glimepiride 2 MG tablet Commonly known as: AMARYL     TAKE these medications   allopurinol 300 MG tablet Commonly known as: ZYLOPRIM Take 300 mg by mouth daily.   Alpha-Lipoic Acid 600 MG Caps Take 1 capsule (600 mg total) by mouth daily.   calcium carbonate 1250 (500 Ca) MG chewable tablet Commonly known as: OS-CAL Chew 1 tablet by mouth daily.   cholecalciferol 1000 units tablet Commonly known as: VITAMIN D Take 1,000 Units by mouth 2 (two) times daily.   fenofibrate 160 MG tablet Take 160 mg by mouth daily.   GLUCOSAMINE CHONDR 1500 COMPLX PO Take 1 tablet by mouth 2 (two) times daily.   ketoconazole 2 % cream Commonly known as: NIZORAL Apply  a small amount to affected area as directed  apply to scaly areas between eyes, and around nose at bedtime on monday, wednesday and friday   melatonin 5  MG Tabs Take 5 mg by mouth at bedtime.   metFORMIN 1000 MG tablet Commonly known as: GLUCOPHAGE Take 1,000 mg by mouth 2 (two) times daily with a meal.   multivitamin with minerals tablet Take 1 tablet by mouth daily.   Myrbetriq 50 MG Tb24 tablet Generic drug: mirabegron ER Take 50 mg by mouth daily.   NON FORMULARY Take 1 tablet by mouth daily. [employer]   olmesartan-hydrochlorothiazide 40-12.5 MG tablet Commonly known as: BENICAR HCT Take 1 tablet by mouth daily.   omeprazole 20 MG capsule Commonly  known as: PRILOSEC Take 20 mg by mouth daily.   Otezla 30 MG Tabs Generic drug: Apremilast Take 1 tablet (30 mg total) by mouth 2 (two) times daily. What changed: Another medication with the same name was removed. Continue taking this medication, and follow the directions you see here.   pregabalin 50 MG capsule Commonly known as: Lyrica Take 1 capsule (50 mg total) by mouth 2 (two) times daily.   rOPINIRole 1 MG tablet Commonly known as: REQUIP Take 1 mg by mouth 2 (two) times daily.   rosuvastatin 10 MG tablet Commonly known as: CRESTOR Take 10 mg by mouth daily.   tamsulosin 0.4 MG Caps capsule Commonly known as: FLOMAX Take 0.4 mg by mouth daily.   temazepam 7.5 MG capsule Commonly known as: RESTORIL Take 7.5 mg by mouth at bedtime.   traZODone 50 MG tablet Commonly known as: DESYREL Take 100 mg by mouth at bedtime as needed for sleep.   vitamin C 500 MG tablet Commonly known as: ASCORBIC ACID Take 500 mg by mouth daily.       Follow-up Information    Dion Body, MD Follow up in 1 week(s).   Specialty: Family Medicine Contact information: [place] [address] [phone]               Signed: Sharen Hones 05/24/2020, 9:05 AM

## 2020-06-08 ENCOUNTER — Telehealth: Payer: Self-pay | Admitting: Student in an Organized Health Care Education/Training Program

## 2020-06-08 MED ORDER — PREGABALIN 50 MG PO CAPS: 50.0000 mg | ORAL_CAPSULE | Freq: Two times a day (BID) | ORAL | 3 refills | Status: DC

## 2020-06-08 NOTE — Telephone Encounter (Signed)
I added [facility] to his list.

## 2020-06-08 NOTE — Telephone Encounter (Signed)
They need new script for Lyrica. Patient has changed pharmacy to them and the current script cannot be transferred.

## 2020-07-27 ENCOUNTER — Ambulatory Visit: Payer: Medicare Other | Admitting: Dermatology
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
## 2020-08-21 ENCOUNTER — Ambulatory Visit (INDEPENDENT_AMBULATORY_CARE_PROVIDER_SITE_OTHER): Payer: Medicare Other | Admitting: Dermatology

## 2020-08-21 ENCOUNTER — Encounter: Payer: Self-pay | Admitting: Dermatology

## 2020-08-21 DIAGNOSIS — C44212 Basal cell carcinoma of skin of right ear and external auricular canal: Secondary | ICD-10-CM

## 2020-08-21 DIAGNOSIS — C4491 Basal cell carcinoma of skin, unspecified: Secondary | ICD-10-CM

## 2020-08-21 DIAGNOSIS — L570 Actinic keratosis: Secondary | ICD-10-CM

## 2020-08-21 DIAGNOSIS — L409 Psoriasis, unspecified: Secondary | ICD-10-CM | POA: Diagnosis not present

## 2020-08-21 DIAGNOSIS — D492 Neoplasm of unspecified behavior of bone, soft tissue, and skin: Secondary | ICD-10-CM

## 2020-08-21 DIAGNOSIS — Z85820 Personal history of malignant melanoma of skin: Secondary | ICD-10-CM | POA: Diagnosis not present

## 2020-08-21 HISTORY — DX: Basal cell carcinoma of skin, unspecified: C44.91

## 2020-08-21 MED ORDER — TRIAMCINOLONE ACETONIDE 0.1 % EX CREA: TOPICAL_CREAM | CUTANEOUS | 3 refills | Status: DC

## 2020-08-21 MED ORDER — CALCIPOTRIENE 0.005 % EX CREA: TOPICAL_CREAM | CUTANEOUS | 2 refills | Status: DC

## 2020-08-21 NOTE — Progress Notes (Signed)
Follow-Up Visit   Subjective  Andrew Obrien is a 86 y.o. male who presents for the following: Follow-up, Psoriasis (6 months f/u on Psoriasis on hands treating with Otezla 30 mg tablets bid, pt report his hands was clear but recently started to flare ), and Skin Problem (Check a growth on the R ear pt had treated with LN2 6 months ago never went away ). Hx of Melanoma on the L Cheek several years ago treated in [place].   The following portions of the chart were reviewed this encounter and updated as appropriate:   Tobacco  Allergies  Meds  Problems  Med Hx  Surg Hx  Fam Hx     Review of Systems:  No other skin or systemic complaints except as noted in HPI or Assessment and Plan.  Objective  Well appearing patient in no apparent distress; mood and affect are within normal limits.  A focused examination was performed including face,ears,hands. Relevant physical exam findings are noted in the Assessment and Plan.  Objective  Right Hand - Anterior: Well-demarcated erythematous papules/plaques with silvery scale, guttate pink scaly papules.   Images          Objective  L cheek: Well healed scar with no evidence of recurrence, no lymphadenopathy.   Objective  L cheek: Erythematous thin papules/macules with gritty scale.   Objective  Right Ear mid helix: 1.0 x 0.6 cm crusted papule    Assessment & Plan  Psoriasis - hands; improved with oral Otezla, but recent flare Right Hand - Anterior Continue oral Otezla. Start topical Calcipotriene cream.  Psoriasis is a chronic non-curable, but treatable genetic/hereditary disease that may have other systemic features affecting other organ systems such as joints (Psoriatic Arthritis). It is associated with an increased risk of inflammatory bowel disease, heart disease, non-alcoholic fatty liver disease, and depression.     Side effects of Otezla (apremilast) include diarrhea, nausea, headache, upper respiratory  infection, depression, and weight decrease (5-10%). It should only be taken by pregnant women after a discussion regarding risks and benefits with their doctor. Goal is control of skin condition, not cure.  The use of [place] requires long term medication management, including periodic office visits.  No headaches, no diarrhea, no depression, no nausea  Cont Otezla 30 mg take 1 tablet daily  Start Triamcinolone 0.1% cream apply to hands Monday/Wednesday/Friday   Start Calcipotriene cream apply to hands at bedtime   Ordered Medications: triamcinolone (KENALOG) 0.1 % calcipotriene (DOVONOX) 0.005 % cream  Other Related Medications Apremilast (OTEZLA) 30 MG TABS  History of melanoma L cheek no lymphadenopathy. Clear. Observe for recurrence. Call clinic for new or changing lesions.  Recommend regular skin exams, daily broad-spectrum spf 30+ sunscreen use, and photoprotection.     AK (actinic keratosis) L cheek Destruction of lesion - L cheek Complexity: simple   Destruction method: cryotherapy   Informed consent: discussed and consent obtained   Timeout:  patient name, date of birth, surgical site, and procedure verified Lesion destroyed using liquid nitrogen: Yes   Region frozen until ice ball extended beyond lesion: Yes   Outcome: patient tolerated procedure well with no complications   Post-procedure details: wound care instructions given    Neoplasm of skin Right Ear mid helix Skin / nail biopsy Type of biopsy: tangential   Informed consent: discussed and consent obtained   Patient was prepped and draped in usual sterile fashion: area prepped with alochol. Anesthesia: the lesion was anesthetized in a standard fashion  Anesthetic:  1% lidocaine w/ epinephrine 1-100,000 buffered w/ 8.4% NaHCO3 Instrument used: flexible razor blade   Hemostasis achieved with: pressure, aluminum chloride and electrodesiccation   Outcome: patient tolerated procedure well   Post-procedure details:  wound care instructions given   Post-procedure details comment:  Ointment and small bandage  Skin / nail biopsy  Specimen 1 - Surgical pathology Differential Diagnosis: R/O BCC   Check Margins: No 1.0 x 0.6 cm crusted papule  Return in about 4 weeks (around 09/18/2020) for Psoriasis .  IMarye Round, CMA, am acting as scribe for Sarina Ser, MD .  Documentation: I have reviewed the above documentation for accuracy and completeness, and I agree with the above.  Sarina Ser, MD

## 2020-08-21 NOTE — Patient Instructions (Signed)

## 2020-08-22 ENCOUNTER — Encounter: Payer: Self-pay | Admitting: Dermatology

## 2020-08-25 ENCOUNTER — Telehealth: Payer: Self-pay

## 2020-08-25 NOTE — Telephone Encounter (Signed)
Tried to call patient regarding biopsy results - no answer/hd ? ?

## 2020-08-25 NOTE — Telephone Encounter (Signed)
-----   Message from Ralene Bathe, MD sent at 08/24/2020  2:25 PM EST ----- Diagnosis Skin , right ear, mid helix BASAL CELL CARCINOMA, NODULAR PATTERN, BASE INVOLVED  Cancer - BCC Schedule surgery

## 2020-09-06 ENCOUNTER — Telehealth: Payer: Self-pay

## 2020-09-06 NOTE — Telephone Encounter (Signed)
Advised patient of results and scheduled surgery appointment/hd 

## 2020-09-06 NOTE — Telephone Encounter (Signed)
-----   Message from Ralene Bathe, MD sent at 08/24/2020  2:25 PM EST ----- Diagnosis Skin , right ear, mid helix BASAL CELL CARCINOMA, NODULAR PATTERN, BASE INVOLVED  Cancer - BCC Schedule surgery

## 2020-09-18 ENCOUNTER — Ambulatory Visit: Payer: Medicare Other | Admitting: Dermatology

## 2020-09-19 ENCOUNTER — Other Ambulatory Visit: Payer: Self-pay

## 2020-09-19 ENCOUNTER — Encounter: Payer: Self-pay | Admitting: Student in an Organized Health Care Education/Training Program

## 2020-09-19 ENCOUNTER — Ambulatory Visit
Payer: Medicare Other | Attending: Student in an Organized Health Care Education/Training Program | Admitting: Student in an Organized Health Care Education/Training Program

## 2020-09-19 VITALS — BP 145/69 | HR 65 | Temp 97.3°F | Resp 18 | Ht 72.0 in | Wt 216.0 lb

## 2020-09-19 DIAGNOSIS — M961 Postlaminectomy syndrome, not elsewhere classified: Secondary | ICD-10-CM | POA: Diagnosis present

## 2020-09-19 DIAGNOSIS — G894 Chronic pain syndrome: Secondary | ICD-10-CM | POA: Diagnosis present

## 2020-09-19 DIAGNOSIS — M792 Neuralgia and neuritis, unspecified: Secondary | ICD-10-CM | POA: Diagnosis present

## 2020-09-19 DIAGNOSIS — Z9689 Presence of other specified functional implants: Secondary | ICD-10-CM | POA: Diagnosis present

## 2020-09-19 DIAGNOSIS — M170 Bilateral primary osteoarthritis of knee: Secondary | ICD-10-CM | POA: Diagnosis present

## 2020-09-19 NOTE — Progress Notes (Signed)
PROVIDER NOTE: Information contained herein reflects review and annotations entered in association with encounter. Interpretation of such information and data should be left to medically-trained personnel. Information provided to patient can be located elsewhere in the medical record under "Patient Instructions". Document created using STT-dictation technology, any transcriptional errors that may result from process are unintentional.    Patient: Andrew Obrien  Service Category: E/M  Provider: Gillis Santa, MD  DOB: 1935-01-05  DOS: 09/19/2020  Specialty: Interventional Pain Management  MRN: [id number]  Setting: Ambulatory outpatient  PCP: Dion Body, MD  Type: Established Patient    Referring Provider: Dion Body, MD  Location: Office  Delivery: Face-to-face     HPI  Andrew Obrien, a 86 y.o. year old male, is here today because of his Neuropathic pain [M79.2]. Andrew Obrien primary complain today is Back Pain (lower) Last encounter: My last encounter with him was on 06/08/2020. Pertinent problems: Andrew Obrien has Status post total replacement of hip; History of total right knee replacement (x2); Failed back surgical syndrome; Bilateral primary osteoarthritis of knee; Neuropathic pain; Spinal cord stimulator status; Chronic pain syndrome; CKD (chronic kidney disease) stage 3, GFR 30-59 ml/min (HCC); and Diabetic neuropathy (HCC) on their pertinent problem list. Pain Assessment: Severity of Chronic pain is reported as a 6 /10. Location: Back Lower/both legs to the knees. Onset: More than a month ago. Quality: Stabbing. Timing: Intermittent. Modifying factor(s): nothing. Vitals:  height is 6' (1.829 m) and weight is 216 lb (98 kg). His temporal temperature is 97.3 F (36.3 C) (abnormal). His blood pressure is 145/69 (abnormal) and his pulse is 65. His respiration is 18 and oxygen saturation is 99%.   Reason for encounter: follow-up evaluation    Is utilizing SCS, continues  Lyrica as prescribed. States that it helps manage his pain. This is being managed by his PCP. Overall, right shoulder pain has improved as well.   ROS  Constitutional: Denies any fever or chills Gastrointestinal: No reported hemesis, hematochezia, vomiting, or acute GI distress Musculoskeletal: +LBP Neurological: No reported episodes of acute onset apraxia, aphasia, dysarthria, agnosia, amnesia, paralysis, loss of coordination, or loss of consciousness  Medication Review  Apremilast, Azelastine-Fluticasone, DULoxetine, Glucosamine-Chondroit-Vit C-Mn, NON FORMULARY, allopurinol, calcipotriene, calcium carbonate, cholecalciferol, fenofibrate, ketoconazole, melatonin, metFORMIN, mirabegron ER, multivitamin with minerals, olmesartan-hydrochlorothiazide, omeprazole, pregabalin, rOPINIRole, rosuvastatin, tamsulosin, temazepam, traZODone, triamcinolone, and vitamin C  History Review  Allergy: Andrew Obrien has No Known Allergies. Drug: Andrew Obrien  reports no history of drug use. Alcohol:  reports no history of alcohol use. Tobacco:  reports that he has never smoked. He has never used smokeless tobacco. Social: Andrew Obrien  reports that he has never smoked. He has never used smokeless tobacco. He reports that he does not drink alcohol and does not use drugs. Medical:  has a past medical history of Arthritis, Basal cell carcinoma (08/21/2020), Depression, Diabetes mellitus without complication ([place]), Difficult intubation, GERD (gastroesophageal reflux disease), Heart murmur, basal cell carcinoma (2001, 2002), basal cell carcinoma (2004), basal cell carcinoma (2005), basal cell carcinoma (2006), basal cell carcinoma (2008), basal cell carcinoma (2009), basal cell carcinoma (2015), basal cell carcinoma (2016), basal cell carcinoma (2017), basal cell carcinoma (2020), Hypertension, Macular degeneration, Melanoma ([place]) (1973), Neuropathy, OSA on CPAP, Prostate cancer ([place]), Psoriasis, and Squamous cell  carcinoma of skin (2006). Surgical: Andrew Obrien  has a past surgical history that includes Pain pump implantation; Cataract extraction w/ intraocular lens  implant, bilateral; Total shoulder replacement (Left); Rotator cuff repair (Left); Lumbar spine surgery; Mouth  surgery; prostate cancer; Skin cancer excision; Replacement total knee (Right); and Total hip arthroplasty (Right, 04/14/2017). Family: family history is not on file.  Laboratory Chemistry Profile   Renal Lab Results  Component Value Date   BUN 23 05/24/2020   CREATININE 1.53 (H) 05/24/2020   GFRAA >60 04/16/2017   GFRNONAA 45 (L) 05/24/2020     Hepatic Lab Results  Component Value Date   AST 24 05/23/2020   ALT 18 05/23/2020   ALBUMIN 3.5 05/23/2020   ALKPHOS 34 (L) 05/23/2020     Electrolytes Lab Results  Component Value Date   NA 138 05/24/2020   K 4.9 05/24/2020   CL 105 05/24/2020   CALCIUM 9.1 05/24/2020     Bone No results found for: VD25OH, VD125OH2TOT, XT0240XB3, ZH2992EQ6, 25OHVITD1, 25OHVITD2, 25OHVITD3, TESTOFREE, TESTOSTERONE   Inflammation (CRP: Acute Phase) (ESR: Chronic Phase) Lab Results  Component Value Date   CRP 1.1 (H) 04/02/2017   ESRSEDRATE 25 (H) 04/02/2017       Note: Above Lab results reviewed.  Recent Imaging Review  CT Cervical Spine Wo Contrast CLINICAL DATA:  Dizziness and fall.  EXAM: CT CERVICAL SPINE WITHOUT CONTRAST  TECHNIQUE: Multidetector CT imaging of the cervical spine was performed without intravenous contrast. Multiplanar CT image reconstructions were also generated.  COMPARISON:  July 01, 2018  FINDINGS: Alignment: Normal.  Skull base and vertebrae: No acute fracture. No primary bone lesion or focal pathologic process.  Soft tissues and spinal canal: No prevertebral fluid or swelling. No visible canal hematoma.  Disc levels: Moderate to marked severity endplate sclerosis is seen at the levels of C4-C5 and C6-C7. Moderate to marked  severity intervertebral disc space narrowing is also seen at these levels.  Moderate severity bilateral multilevel facet joint hypertrophy is noted.  Upper chest: Negative.  Other: None.  IMPRESSION: 1. Moderate to marked severity degenerative changes of the cervical spine, as described above. 2. No evidence of an acute fracture or subluxation.  Electronically Signed   By: Virgina Norfolk M.D.   On: 05/21/2020 16:30 CT Head Wo Contrast CLINICAL DATA:  Dizziness and fall.  EXAM: CT HEAD WITHOUT CONTRAST  TECHNIQUE: Contiguous axial images were obtained from the base of the skull through the vertex without intravenous contrast.  COMPARISON:  None.  FINDINGS: Brain: There is mild cerebral atrophy with widening of the extra-axial spaces and ventricular dilatation. There are areas of decreased attenuation within the white matter tracts of the supratentorial brain, consistent with microvascular disease changes.  Vascular: No hyperdense vessel or unexpected calcification.  Skull: Normal. Negative for fracture or focal lesion.  Sinuses/Orbits: No acute finding.  Other: None.  IMPRESSION: No acute intracranial pathology.  Electronically Signed   By: Virgina Norfolk M.D.   On: 05/21/2020 16:08 Note: Reviewed        Physical Exam  General appearance: Well nourished, well developed, and well hydrated. In no apparent acute distress Mental status: Alert, oriented x 3 (person, place, & time)       Respiratory: No evidence of acute respiratory distress Eyes: PERLA Vitals: BP (!) 145/69   Pulse 65   Temp (!) 97.3 F (36.3 C) (Temporal)   Resp 18   Ht 6' (1.829 m)   Wt 216 lb (98 kg)   SpO2 99%   BMI 29.29 kg/m  BMI: Estimated body mass index is 29.29 kg/m as calculated from the following:   Height as of this encounter: 6' (1.829 m).   Weight as of this encounter: 216  lb (98 kg). Ideal: Ideal body weight: 77.6 kg (171 lb 1.2 oz) Adjusted ideal body weight:  85.7 kg (189 lb 0.7 oz)   Lumbar Spine Area Exam  Skin & Axial Inspection:No masses, redness, or swelling, spinal cord stimulator IPG present Alignment:Symmetrical Functional PFX:[id number] ROM Stability:No instability detected Muscle Tone/Strength:Functionally intact. No obvious neuro-muscular anomalies detected. Sensory (Neurological):Musculoskeletal pain pattern Palpation:Complains of area being tender to palpationlower sacral area  Provocative Tests: Hyperextension/rotation test:(+)due to pain. Lumbar quadrant test (Kemp's test):deferred today Lateral bending test:(+)due to pain. Patrick's Maneuver:deferred today FABER* test:deferred today S-I anterior distraction/compression test:deferred today S-I lateral compression test:deferred today S-I Thigh-thrust test:deferred today S-I Gaenslen's test:deferred today *(Flexion, ABduction and External Rotation)  Gait & Posture Assessment  Ambulation:Patient ambulates using a cane Gait:Limited. Using assistive device to ambulate Posture:Difficulty standing up straight, due to pain  Lower Extremity Exam    Side:Right lower extremity  Side:Left lower extremity  Stability:No instability observed  Stability:No instability observed  Skin & Extremity Inspection:Skin color, temperature, and hair growth are WNL. No peripheral edema or cyanosis. No masses, redness, swelling, asymmetry, or associated skin lesions. No contractures.  Skin & Extremity Inspection:Skin color, temperature, and hair growth are WNL. No peripheral edema or cyanosis. No masses, redness, swelling, asymmetry, or associated skin lesions. No contractures.  Functional JME:QASTMHDQ after treatment   Functional QIW:LNLGXQJJ after treatment   Muscle Tone/Strength:Functionally intact. No obvious neuro-muscular  anomalies detected.  Muscle Tone/Strength:Functionally intact. No obvious neuro-muscular anomalies detected.  Sensory (Neurological):Neuropathic pain  Sensory (Neurological):Neuropathic pain  DTR: Patellar:deferred today Achilles:deferred today Plantar:deferred today  DTR: Patellar:deferred today Achilles:deferred today Plantar:deferred today  Palpation:No palpable anomalies  Palpation:No palpable anomalies    Assessment   Status Diagnosis  Controlled Controlled Controlled 1. Neuropathic pain   2. Failed back surgical syndrome   3. Spinal cord stimulator status   4. Bilateral primary osteoarthritis of knee   5. Chronic pain syndrome      Updated Problems: Problem  Chronic Pain Syndrome    Plan of Care   Overall Andrew Obrien is doing fair in regards to his pain. No recent falls Is utilizing Lyrica prescribed by his PCP. States that it helps manage his pain and also helps with sleep. Had previously discussed right shoulder pain however that has improved. Otherwise pain at baseline, follow up prn.   Follow-up plan:   Return if symptoms worsen or fail to improve.   Date: 04/17/2020; Time: 2:29 PM    Recent Visits No visits were found meeting these conditions. Showing recent visits within past 90 days and meeting all other requirements Today's Visits Date Type Provider Dept  09/19/20 Office Visit Gillis Santa, MD [facility]  Showing today's visits and meeting all other requirements Future Appointments No visits were found meeting these conditions. Showing future appointments within next 90 days and meeting all other requirements  I discussed the assessment and treatment plan with the patient. The patient was provided an opportunity to ask questions and all were answered. The patient agreed with the plan and demonstrated an understanding of the instructions.  Patient advised to call back or seek an in-person evaluation if the symptoms  or condition worsens.  Duration of encounter: 15 minutes.  Note by: Gillis Santa, MD Date: 09/19/2020; Time: 8:21 AM

## 2020-09-19 NOTE — Progress Notes (Signed)
Safety precautions to be maintained throughout the outpatient stay will include: orient to surroundings, keep bed in low position, maintain call bell within reach at all times, provide assistance with transfer out of bed and ambulation.  

## 2020-10-09 ENCOUNTER — Telehealth: Payer: Self-pay | Admitting: Student in an Organized Health Care Education/Training Program

## 2020-10-09 MED ORDER — PREGABALIN 50 MG PO CAPS: 50.0000 mg | ORAL_CAPSULE | Freq: Three times a day (TID) | ORAL | 3 refills | Status: AC

## 2020-10-09 NOTE — Telephone Encounter (Signed)
Patient notified script for Lyrica has been sent to pharmacy.

## 2020-10-09 NOTE — Telephone Encounter (Signed)
Last appt 09-19-20. You notes indicate that Lyrica is being managed by PCP, however you prescribed in 05/2020 with 3 refills. No increase in Lyrical was discussed.

## 2020-10-09 NOTE — Telephone Encounter (Signed)
Patient called concerning Lyrica. Was supposed to be increased and sent in. Wants it sent to [facility] not [facility] .

## 2020-10-17 ENCOUNTER — Ambulatory Visit (INDEPENDENT_AMBULATORY_CARE_PROVIDER_SITE_OTHER): Payer: Medicare Other | Admitting: Dermatology

## 2020-10-17 ENCOUNTER — Other Ambulatory Visit: Payer: Self-pay

## 2020-10-17 ENCOUNTER — Telehealth: Payer: Self-pay

## 2020-10-17 ENCOUNTER — Other Ambulatory Visit: Payer: Self-pay | Admitting: Dermatology

## 2020-10-17 ENCOUNTER — Encounter: Payer: Self-pay | Admitting: Dermatology

## 2020-10-17 DIAGNOSIS — C44212 Basal cell carcinoma of skin of right ear and external auricular canal: Secondary | ICD-10-CM

## 2020-10-17 DIAGNOSIS — L409 Psoriasis, unspecified: Secondary | ICD-10-CM

## 2020-10-17 MED ORDER — CLOBETASOL PROPIONATE 0.05 % EX CREA: 1.0000 "application " | TOPICAL_CREAM | CUTANEOUS | 2 refills | Status: DC

## 2020-10-17 MED ORDER — MUPIROCIN 2 % EX OINT: 1.0000 "application " | TOPICAL_OINTMENT | Freq: Every day | CUTANEOUS | 1 refills | Status: AC

## 2020-10-17 NOTE — Telephone Encounter (Signed)
Patient called office regarding [employer]. Patient's message states he has been on [place] for years and now someone is asking him to sign up for patient assistance. He refuses to do so and wants his RX sent to [facility].   Tried calling patient to talk to him but no answer and no voicemail. Patient can not get RX at [facility] due to RX being a speciality medication.   Patient will need to apply for patient assistance due to [facility] insurance.

## 2020-10-17 NOTE — Patient Instructions (Addendum)
If you have any questions or concerns for your doctor, please call our main line at [phone] and press option 4 to reach your doctor's medical assistant. If no one answers, please leave a voicemail as directed and we will return your call as soon as possible. Messages left after 4 pm will be answered the following business day.   You may also send [place] a message via [place]. We typically respond to MyChart messages within 1-2 business days.  For prescription refills, please ask your pharmacy to contact our office. Our fax number is [phone].  If you have an urgent issue when the clinic is closed that cannot wait until the next business day, you can page your doctor at the number below.    Please note that while we do our best to be available for urgent issues outside of office hours, we are not available 24/7.   If you have an urgent issue and are unable to reach [place], you may choose to seek medical care at your doctor's office, retail clinic, urgent care center, or emergency room.  If you have a medical emergency, please immediately call 911 or go to the emergency department.  Pager Numbers  - Dr. Nehemiah Massed: [phone]  - Dr. Laurence Ferrari: [phone]  - Dr. Nicole Kindred: [phone]  In the event of inclement weather, please call our main line at [phone] for an update on the status of any delays or closures.  Dermatology Medication Tips: Please keep the boxes that topical medications come in in order to help keep track of the instructions about where and how to use these. Pharmacies typically print the medication instructions only on the boxes and not directly on the medication tubes.   If your medication is too expensive, please contact our office at [phone] option 4 or send [place] a message through [place].   We are unable to tell what your co-pay for medications will be in advance as this is different depending on your insurance coverage. However, we may be able to find a substitute  medication at lower cost or fill out paperwork to get insurance to cover a needed medication.   If a prior authorization is required to get your medication covered by your insurance company, please allow [place] 1-2 business days to complete this process.  Drug prices often vary depending on where the prescription is filled and some pharmacies may offer cheaper prices.  The website www.goodrx.com contains coupons for medications through different pharmacies. The prices here do not account for what the cost may be with help from insurance (it may be cheaper with your insurance), but the website can give you the price if you did not use any insurance.  - You can print the associated coupon and take it with your prescription to the pharmacy.  - You may also stop by our office during regular business hours and pick up a GoodRx coupon card.  - If you need your prescription sent electronically to a different pharmacy, notify our office through [facility] or by phone at [phone] option 4.    \Wound Care Instructions  1. Cleanse wound gently with soap and water once a day then pat dry with clean gauze. Apply a thing coat of Petrolatum (petroleum jelly, "Vaseline") over the wound (unless you have an allergy to this). We recommend that you use a new, sterile tube of Vaseline. Do not pick or remove scabs. Do not remove the yellow or white "healing tissue" from the base of the wound.  2. Cover the wound with fresh, clean, nonstick gauze and secure with paper tape. You may use Band-Aids in place of gauze and tape if the would is small enough, but would recommend trimming much of the tape off as there is often too much. Sometimes Band-Aids can irritate the skin.  3. You should call the office for your biopsy report after 1 week if you have not already been contacted.  4. If you experience any problems, such as abnormal amounts of bleeding, swelling, significant bruising, significant pain, or evidence  of infection, please call the office immediately.  5. FOR ADULT SURGERY PATIENTS: If you need something for pain relief you may take 1 extra strength Tylenol (acetaminophen) AND 2 Ibuprofen (200mg  each) together every 4 hours as needed for pain. (do not take these if you are allergic to them or if you have a reason you should not take them.) Typically, you may only need pain medication for 1 to 3 days.    Call Iantha Fallen for [place] at [phone]  For hands, stop the Triamcinolone cream and start Clobetasol cream at night on Mondays, Wednesdays and Fridays

## 2020-10-17 NOTE — Progress Notes (Signed)
Follow-Up Visit   Subjective  Andrew Obrien is a 86 y.o. male who presents for the following: BCC bx proven (R ear mid helix, pt presents for treatment of BCC) and Psoriasis (Hands, Otezla 30mg  1 po bid, no s/e, TMC 0.1% cr 3d/wk, Calcipotriene cr qhs, No headaches, GI upset, mood changes from [place]).  The following portions of the chart were reviewed this encounter and updated as appropriate:   Tobacco  Allergies  Meds  Problems  Med Hx  Surg Hx  Fam Hx     Review of Systems:  No other skin or systemic complaints except as noted in HPI or Assessment and Plan.  Objective  Well appearing patient in no apparent distress; mood and affect are within normal limits.  A focused examination was performed including R ear, bil hands. Relevant physical exam findings are noted in the Assessment and Plan.  Objective  Right Ear Mid Helix: Pink bx site 1.1cm  Objective  bil hands: Pink scaly macules, most persistent interdigital space thumb and index finger bil Palm and dorsum fingers much improved  Images                 Assessment & Plan  Basal cell carcinoma (BCC) of skin of right ear Right Ear Mid Helix  mupirocin ointment (BACTROBAN) 2 %  Skin excision  Lesion length (cm):  1.1 Lesion width (cm):  1.1 Margin per side (cm):  0.2 Total excision diameter (cm):  1.5 Informed consent: discussed and consent obtained   Timeout: patient name, date of birth, surgical site, and procedure verified   Procedure prep:  Patient was prepped and draped in usual sterile fashion Prep type:  Isopropyl alcohol and povidone-iodine Anesthesia: the lesion was anesthetized in a standard fashion   Anesthetic:  1% lidocaine w/ epinephrine 1-100,000 buffered w/ 8.4% NaHCO3 Instrument used: #15 blade   Hemostasis achieved with: pressure   Hemostasis achieved with comment:  Electrocautery Outcome: patient tolerated procedure well with no complications   Post-procedure details:  sterile dressing applied and wound care instructions given   Dressing type: bandage and bacitracin (Mupirocin)    Destruction of lesion Complexity: extensive   Destruction method: electrodesiccation and curettage   Informed consent: discussed and consent obtained   Timeout:  patient name, date of birth, surgical site, and procedure verified Procedure prep:  Patient was prepped and draped in usual sterile fashion Prep type:  Isopropyl alcohol Anesthesia: the lesion was anesthetized in a standard fashion   Anesthetic:  1% lidocaine w/ epinephrine 1-100,000 buffered w/ 8.4% NaHCO3 Curettage performed in three different directions: Yes   Electrodesiccation performed over the curetted area: Yes   Lesion length (cm):  1.1 Lesion width (cm):  1.1 Margin per side (cm):  0.2 Final wound size (cm):  1.5 Hemostasis achieved with:  pressure, aluminum chloride and electrodesiccation Outcome: patient tolerated procedure well with no complications   Post-procedure details: sterile dressing applied and wound care instructions given   Dressing type: bandage and bacitracin    Specimen 1 - Surgical pathology Differential Diagnosis: Residual BCC vs other  Check Margins: yes Pink bx site 1.1cm EDC  BCC bx proven - R ear mid helix, simple excision and EDC today Start Mupirocin oint qd to treatment site  Psoriasis -severe, involving mostly the hands but improved on oral systemic Otezla and topical triamcinolone, but persistent and not to goal. bil hands Psoriasis is a chronic non-curable, but treatable genetic/hereditary disease that may have other systemic features affecting other organ  systems such as joints (Psoriatic Arthritis). It is associated with an increased risk of inflammatory bowel disease, heart disease, non-alcoholic fatty liver disease, and depression.     Improved, with no s/e from [facility] 30mg  1 po bid D/C TMC 0.1%  Start Clobetasol cr hs Monday, Wednesday and  Fridays Cont Calcipotriene cr qhs  Side effects of Otezla (apremilast) include diarrhea, nausea, headache, upper respiratory infection, depression, and weight decrease (5-10%). It should only be taken by pregnant women after a discussion regarding risks and benefits with their doctor. Goal is control of skin condition, not cure.  The use of [place] Nail requires long term medication management, including periodic office visits.  Topical steroids (such as triamcinolone, fluocinolone, fluocinonide, mometasone, clobetasol, halobetasol, betamethasone, hydrocortisone) can cause thinning and lightening of the skin if they are used for too long in the same area. Your physician has selected the right strength medicine for your problem and area affected on the body. Please use your medication only as directed by your physician to prevent side effects.    clobetasol cream (TEMOVATE) 0.05 % - bil hands  Other Related Medications Apremilast (OTEZLA) 30 MG TABS triamcinolone (KENALOG) 0.1 % calcipotriene (DOVONOX) 0.005 % cream  Return in about 2 months (around 12/17/2020) for Psoriasis f/u.  I, Othelia Pulling, RMA, am acting as scribe for Sarina Ser, MD .  Documentation: I have reviewed the above documentation for accuracy and completeness, and I agree with the above.  Sarina Ser, MD

## 2020-10-19 ENCOUNTER — Encounter: Payer: Self-pay | Admitting: Dermatology

## 2020-10-30 ENCOUNTER — Telehealth: Payer: Self-pay

## 2020-10-30 NOTE — Telephone Encounter (Signed)
-----   Message from Ralene Bathe, MD sent at 10/27/2020 11:27 AM EDT ----- Diagnosis Skin , right ear mid helix BASAL CELL CARCINOMA, NODULAR PATTERN  Cancer - BCC Margins clear Also treated with destruction Recheck next visit

## 2020-10-30 NOTE — Telephone Encounter (Signed)
Left message for patient to call office for results/hd 

## 2020-10-30 NOTE — Telephone Encounter (Signed)
Discussed biopsy results with pt  °

## 2020-10-30 NOTE — Telephone Encounter (Signed)
-----   Message from David C Kowalski, MD sent at 10/27/2020 11:27 AM EDT ----- Diagnosis Skin , right ear mid helix BASAL CELL CARCINOMA, NODULAR PATTERN  Cancer - BCC Margins clear Also treated with destruction Recheck next visit 

## 2020-12-21 ENCOUNTER — Ambulatory Visit (INDEPENDENT_AMBULATORY_CARE_PROVIDER_SITE_OTHER): Payer: Medicare Other | Admitting: Dermatology

## 2020-12-21 ENCOUNTER — Ambulatory Visit: Payer: Medicare Other | Admitting: Dermatology

## 2020-12-21 ENCOUNTER — Other Ambulatory Visit: Payer: Self-pay

## 2020-12-21 DIAGNOSIS — L820 Inflamed seborrheic keratosis: Secondary | ICD-10-CM | POA: Diagnosis not present

## 2020-12-21 DIAGNOSIS — L570 Actinic keratosis: Secondary | ICD-10-CM | POA: Diagnosis not present

## 2020-12-21 DIAGNOSIS — L409 Psoriasis, unspecified: Secondary | ICD-10-CM | POA: Diagnosis not present

## 2020-12-21 NOTE — Patient Instructions (Signed)

## 2020-12-21 NOTE — Progress Notes (Signed)
Follow-Up Visit   Subjective  Andrew Obrien is a 86 y.o. male who presents for the following: Psoriasis (Follow up of hands - Otezla 30 mg 2 po qd, Calcipotriene qhs, Clobetasol 3 times per week. No GI upset, headaches or depression with R[place] Nail), Rash (Of right arm x 3 weeks - red spots, no itch), and Other (Scaly spots of scalp).  The following portions of the chart were reviewed this encounter and updated as appropriate:   Tobacco  Allergies  Meds  Problems  Med Hx  Surg Hx  Fam Hx      Review of Systems:  No other skin or systemic complaints except as noted in HPI or Assessment and Plan.  Objective  Well appearing patient in no apparent distress; mood and affect are within normal limits.  A focused examination was performed including scalp, face, hands, arms. Relevant physical exam findings are noted in the Assessment and Plan.  Bilateral hands Pinkness and scale  Scalp (9) Erythematous thin papules/macules with gritty scale.   Scalp Erythematous keratotic or waxy stuck-on papule or plaque.    Assessment & Plan  Psoriasis Bilateral hands  Psoriasis -severe, involving mostly the hands but improved on oral systemic Otezla and Clobetasol and calcipotriene, but persistent and not to goal.   Psoriasis is a chronic non-curable, but treatable genetic/hereditary disease that may have other systemic features affecting other organ systems such as joints (Psoriatic Arthritis). It is associated with an increased risk of inflammatory bowel disease, heart disease, non-alcoholic fatty liver disease, and depression.      Improved, with no s/e from [facility] 30mg  1 po bid Continue Clobetasol cr hs Monday, Wednesday and Fridays Continue Calcipotriene cr qhs   Side effects of Otezla (apremilast) include diarrhea, nausea, headache, upper respiratory infection, depression, and weight decrease (5-10%). It should only be taken by pregnant women after a discussion  regarding risks and benefits with their doctor. Goal is control of skin condition, not cure.  The use of R[place] Nail requires long term medication management, including periodic office visits.   Topical steroids (such as triamcinolone, fluocinolone, fluocinonide, mometasone, clobetasol, halobetasol, betamethasone, hydrocortisone) can cause thinning and lightening of the skin if they are used for too long in the same area. Your physician has selected the right strength medicine for your problem and area affected on the body. Please use your medication only as directed by your physician to prevent side effects.    Related Medications Apremilast (OTEZLA) 30 MG TABS Take 1 tablet (30 mg total) by mouth 2 (two) times daily.  calcipotriene (DOVONOX) 0.005 % cream Apply to hands a bedtime  clobetasol cream (TEMOVATE) 9.47 % Apply 1 application topically 3 (three) times a week. Apply to hands at night on Mondays, Wednesdays and Fridays, avoid face, groin, axilla  AK (actinic keratosis) (9) Scalp  Destruction of lesion - Scalp Complexity: simple   Destruction method: cryotherapy   Informed consent: discussed and consent obtained   Timeout:  patient name, date of birth, surgical site, and procedure verified Lesion destroyed using liquid nitrogen: Yes   Region frozen until ice ball extended beyond lesion: Yes   Outcome: patient tolerated procedure well with no complications   Post-procedure details: wound care instructions given    Inflamed seborrheic keratosis Scalp  Destruction of lesion - Scalp Complexity: simple   Destruction method: cryotherapy   Informed consent: discussed and consent obtained   Timeout:  patient name, date of birth, surgical site, and procedure verified Lesion  destroyed using liquid nitrogen: Yes   Region frozen until ice ball extended beyond lesion: Yes   Outcome: patient tolerated procedure well with no complications   Post-procedure details: wound care instructions  given    Return in about 6 months (around 06/22/2021).  I, Ashok Cordia, CMA, am acting as scribe for Sarina Ser, MD .  Documentation: I have reviewed the above documentation for accuracy and completeness, and I agree with the above.  Sarina Ser, MD

## 2020-12-30 ENCOUNTER — Encounter: Payer: Self-pay | Admitting: Dermatology

## 2021-01-01 ENCOUNTER — Ambulatory Visit: Payer: Medicare Other | Admitting: Dermatology

## 2021-01-04 ENCOUNTER — Telehealth: Payer: Self-pay

## 2021-01-04 NOTE — Telephone Encounter (Signed)
Called patient and went over [place] benefits. This is a covered procedure for him but he may or may not have a $10 copay. Patient advised and will call back to schedule.

## 2021-01-09 ENCOUNTER — Ambulatory Visit (INDEPENDENT_AMBULATORY_CARE_PROVIDER_SITE_OTHER): Payer: Medicare Other

## 2021-01-09 ENCOUNTER — Other Ambulatory Visit: Payer: Self-pay

## 2021-01-09 DIAGNOSIS — L400 Psoriasis vulgaris: Secondary | ICD-10-CM | POA: Diagnosis not present

## 2021-01-09 NOTE — Progress Notes (Signed)
Total Surface Area: 124cm2 Total Energy: 37.20J

## 2021-01-10 ENCOUNTER — Other Ambulatory Visit: Payer: Self-pay

## 2021-01-10 DIAGNOSIS — L409 Psoriasis, unspecified: Secondary | ICD-10-CM

## 2021-01-10 MED ORDER — OTEZLA 30 MG PO TABS: 30.0000 mg | ORAL_TABLET | Freq: Two times a day (BID) | ORAL | 6 refills | Status: DC

## 2021-01-11 ENCOUNTER — Ambulatory Visit (INDEPENDENT_AMBULATORY_CARE_PROVIDER_SITE_OTHER): Payer: Medicare Other

## 2021-01-11 ENCOUNTER — Other Ambulatory Visit: Payer: Self-pay

## 2021-01-11 DIAGNOSIS — L400 Psoriasis vulgaris: Secondary | ICD-10-CM

## 2021-01-11 NOTE — Progress Notes (Signed)
Total Surface Area: 116cm2 Total Energy: 40.02J

## 2021-01-16 ENCOUNTER — Ambulatory Visit: Payer: Medicare Other

## 2021-01-18 ENCOUNTER — Other Ambulatory Visit: Payer: Self-pay

## 2021-01-18 ENCOUNTER — Ambulatory Visit (INDEPENDENT_AMBULATORY_CARE_PROVIDER_SITE_OTHER): Payer: Medicare Other

## 2021-01-18 DIAGNOSIS — L400 Psoriasis vulgaris: Secondary | ICD-10-CM

## 2021-01-18 NOTE — Progress Notes (Signed)
Total Surface Area: 112cm2 Total Energy: 44.35J

## 2021-01-23 ENCOUNTER — Ambulatory Visit (INDEPENDENT_AMBULATORY_CARE_PROVIDER_SITE_OTHER): Payer: Medicare Other

## 2021-01-23 ENCOUNTER — Other Ambulatory Visit: Payer: Self-pay

## 2021-01-23 DIAGNOSIS — L400 Psoriasis vulgaris: Secondary | ICD-10-CM

## 2021-01-23 NOTE — Progress Notes (Signed)
Total Surface Area: 124cm2 Total Energy: 56.42J

## 2021-01-25 ENCOUNTER — Ambulatory Visit (INDEPENDENT_AMBULATORY_CARE_PROVIDER_SITE_OTHER): Payer: Medicare Other

## 2021-01-25 ENCOUNTER — Other Ambulatory Visit: Payer: Self-pay

## 2021-01-25 DIAGNOSIS — L400 Psoriasis vulgaris: Secondary | ICD-10-CM | POA: Diagnosis not present

## 2021-01-25 NOTE — Progress Notes (Signed)
Total Surface Area: 104cm2 Total Energy: 54.39J

## 2021-01-30 ENCOUNTER — Other Ambulatory Visit: Payer: Self-pay

## 2021-01-30 ENCOUNTER — Ambulatory Visit (INDEPENDENT_AMBULATORY_CARE_PROVIDER_SITE_OTHER): Payer: Medicare Other

## 2021-01-30 DIAGNOSIS — L400 Psoriasis vulgaris: Secondary | ICD-10-CM

## 2021-01-30 NOTE — Progress Notes (Signed)
Total Surface Area: 104cm2 Total Energy: 62.50J

## 2021-02-01 ENCOUNTER — Other Ambulatory Visit: Payer: Self-pay

## 2021-02-01 ENCOUNTER — Ambulatory Visit: Payer: Medicare Other

## 2021-02-01 ENCOUNTER — Ambulatory Visit (INDEPENDENT_AMBULATORY_CARE_PROVIDER_SITE_OTHER): Payer: Medicare Other

## 2021-02-01 DIAGNOSIS — L400 Psoriasis vulgaris: Secondary | ICD-10-CM | POA: Diagnosis not present

## 2021-02-01 NOTE — Progress Notes (Signed)
Total Surface Area: 100cm2 Total Energy: 69.10J

## 2021-02-06 ENCOUNTER — Other Ambulatory Visit: Payer: Self-pay

## 2021-02-06 ENCOUNTER — Ambulatory Visit (INDEPENDENT_AMBULATORY_CARE_PROVIDER_SITE_OTHER): Payer: Medicare Other

## 2021-02-06 DIAGNOSIS — L400 Psoriasis vulgaris: Secondary | ICD-10-CM

## 2021-02-06 NOTE — Progress Notes (Signed)
Total Surface Area: 112cm2 Total Energy: 88.93J

## 2021-02-08 ENCOUNTER — Other Ambulatory Visit: Payer: Self-pay

## 2021-02-08 ENCOUNTER — Ambulatory Visit (INDEPENDENT_AMBULATORY_CARE_PROVIDER_SITE_OTHER): Payer: Medicare Other

## 2021-02-08 DIAGNOSIS — L400 Psoriasis vulgaris: Secondary | ICD-10-CM | POA: Diagnosis not present

## 2021-02-08 NOTE — Progress Notes (Signed)
Total Surface Area: 76cm2 Total Energy: 69.39J

## 2021-02-13 ENCOUNTER — Ambulatory Visit (INDEPENDENT_AMBULATORY_CARE_PROVIDER_SITE_OTHER): Payer: Medicare Other

## 2021-02-13 ENCOUNTER — Other Ambulatory Visit: Payer: Self-pay

## 2021-02-13 DIAGNOSIS — L400 Psoriasis vulgaris: Secondary | ICD-10-CM

## 2021-02-13 NOTE — Progress Notes (Signed)
Total Surface Area: 64cm2 Total Energy: 67.14J

## 2021-02-15 ENCOUNTER — Ambulatory Visit (INDEPENDENT_AMBULATORY_CARE_PROVIDER_SITE_OTHER): Payer: Medicare Other

## 2021-02-15 ENCOUNTER — Other Ambulatory Visit: Payer: Self-pay

## 2021-02-15 DIAGNOSIS — L400 Psoriasis vulgaris: Secondary | ICD-10-CM | POA: Diagnosis not present

## 2021-02-15 NOTE — Progress Notes (Signed)
Total Surface Area: 80cm2 Total Energy: 96.48J

## 2021-02-20 ENCOUNTER — Ambulatory Visit: Payer: Medicare Other

## 2021-02-22 ENCOUNTER — Ambulatory Visit: Payer: Medicare Other

## 2021-02-27 ENCOUNTER — Ambulatory Visit (INDEPENDENT_AMBULATORY_CARE_PROVIDER_SITE_OTHER): Payer: Medicare Other

## 2021-02-27 ENCOUNTER — Other Ambulatory Visit: Payer: Self-pay

## 2021-02-27 DIAGNOSIS — L400 Psoriasis vulgaris: Secondary | ICD-10-CM | POA: Diagnosis not present

## 2021-02-27 NOTE — Progress Notes (Signed)
Total Surface Area: 84cm2 Total Energy: 101.30J

## 2021-03-01 ENCOUNTER — Ambulatory Visit (INDEPENDENT_AMBULATORY_CARE_PROVIDER_SITE_OTHER): Payer: Medicare Other

## 2021-03-01 ENCOUNTER — Other Ambulatory Visit: Payer: Self-pay

## 2021-03-01 DIAGNOSIS — L400 Psoriasis vulgaris: Secondary | ICD-10-CM | POA: Diagnosis not present

## 2021-03-01 NOTE — Progress Notes (Signed)
Total Surface Area: 52cm2 Total Energy: 67.71J

## 2021-03-06 ENCOUNTER — Ambulatory Visit (INDEPENDENT_AMBULATORY_CARE_PROVIDER_SITE_OTHER): Payer: Medicare Other

## 2021-03-06 ENCOUNTER — Other Ambulatory Visit: Payer: Self-pay

## 2021-03-06 DIAGNOSIS — L400 Psoriasis vulgaris: Secondary | ICD-10-CM | POA: Diagnosis not present

## 2021-03-06 NOTE — Progress Notes (Signed)
Total Surface Area: 68cm2 Total Energy: 82.01J

## 2021-03-08 ENCOUNTER — Ambulatory Visit: Payer: Medicare Other

## 2021-03-13 ENCOUNTER — Ambulatory Visit: Payer: Medicare Other

## 2021-03-15 ENCOUNTER — Ambulatory Visit (INDEPENDENT_AMBULATORY_CARE_PROVIDER_SITE_OTHER): Payer: Medicare Other

## 2021-03-15 ENCOUNTER — Other Ambulatory Visit: Payer: Self-pay

## 2021-03-15 DIAGNOSIS — L400 Psoriasis vulgaris: Secondary | ICD-10-CM | POA: Diagnosis not present

## 2021-03-15 NOTE — Progress Notes (Signed)
Total Surface Area: 56cm2 Total Energy: 77.62J

## 2021-03-20 ENCOUNTER — Ambulatory Visit (INDEPENDENT_AMBULATORY_CARE_PROVIDER_SITE_OTHER): Payer: Medicare Other

## 2021-03-20 ENCOUNTER — Other Ambulatory Visit: Payer: Self-pay

## 2021-03-20 DIAGNOSIS — L400 Psoriasis vulgaris: Secondary | ICD-10-CM

## 2021-03-20 NOTE — Progress Notes (Signed)
Total Surface Area: 80cm2 Total Energy: 110.88J

## 2021-03-27 ENCOUNTER — Ambulatory Visit: Payer: Medicare Other

## 2021-03-29 ENCOUNTER — Other Ambulatory Visit: Payer: Self-pay

## 2021-03-29 ENCOUNTER — Ambulatory Visit (INDEPENDENT_AMBULATORY_CARE_PROVIDER_SITE_OTHER): Payer: Medicare Other

## 2021-03-29 DIAGNOSIS — L400 Psoriasis vulgaris: Secondary | ICD-10-CM

## 2021-03-29 NOTE — Progress Notes (Signed)
Total Surface Area: 68cm2 Total Energy: 80.10J

## 2021-04-03 ENCOUNTER — Ambulatory Visit (INDEPENDENT_AMBULATORY_CARE_PROVIDER_SITE_OTHER): Payer: Medicare Other

## 2021-04-03 ENCOUNTER — Other Ambulatory Visit: Payer: Self-pay

## 2021-04-03 DIAGNOSIS — L400 Psoriasis vulgaris: Secondary | ICD-10-CM | POA: Diagnosis not present

## 2021-04-03 NOTE — Progress Notes (Signed)
Total Surface Area: 56cm2 Total Energy: 75.82J

## 2021-04-05 ENCOUNTER — Other Ambulatory Visit: Payer: Self-pay

## 2021-04-05 ENCOUNTER — Ambulatory Visit (INDEPENDENT_AMBULATORY_CARE_PROVIDER_SITE_OTHER): Payer: Medicare Other

## 2021-04-05 DIAGNOSIS — L400 Psoriasis vulgaris: Secondary | ICD-10-CM

## 2021-04-05 NOTE — Progress Notes (Signed)
Total Surface Area: 52cm2 Total Energy: 70.41J

## 2021-04-10 ENCOUNTER — Ambulatory Visit (INDEPENDENT_AMBULATORY_CARE_PROVIDER_SITE_OTHER): Payer: Medicare Other

## 2021-04-10 ENCOUNTER — Other Ambulatory Visit: Payer: Self-pay

## 2021-04-10 DIAGNOSIS — L400 Psoriasis vulgaris: Secondary | ICD-10-CM

## 2021-04-10 NOTE — Progress Notes (Signed)
Total Surface Area: 48cm2 Total Energy: 64.99J

## 2021-04-12 ENCOUNTER — Other Ambulatory Visit: Payer: Self-pay

## 2021-04-12 ENCOUNTER — Ambulatory Visit (INDEPENDENT_AMBULATORY_CARE_PROVIDER_SITE_OTHER): Payer: Medicare Other

## 2021-04-12 DIAGNOSIS — L400 Psoriasis vulgaris: Secondary | ICD-10-CM | POA: Diagnosis not present

## 2021-04-12 NOTE — Progress Notes (Signed)
Total Surface Area: 48cm2 Total Energy: 64.99J

## 2021-04-19 ENCOUNTER — Ambulatory Visit (INDEPENDENT_AMBULATORY_CARE_PROVIDER_SITE_OTHER): Payer: Medicare Other

## 2021-04-19 ENCOUNTER — Other Ambulatory Visit: Payer: Self-pay

## 2021-04-19 DIAGNOSIS — L400 Psoriasis vulgaris: Secondary | ICD-10-CM | POA: Diagnosis not present

## 2021-04-19 NOTE — Progress Notes (Signed)
Total Surface Area: 48cm2 Total Energy: 64.99J

## 2021-04-24 ENCOUNTER — Ambulatory Visit (INDEPENDENT_AMBULATORY_CARE_PROVIDER_SITE_OTHER): Payer: Medicare Other

## 2021-04-24 ENCOUNTER — Other Ambulatory Visit: Payer: Self-pay

## 2021-04-24 DIAGNOSIS — L400 Psoriasis vulgaris: Secondary | ICD-10-CM

## 2021-04-24 NOTE — Progress Notes (Signed)
Total Surface Area: 56cm2 Total Energy: 64.46J

## 2021-04-26 ENCOUNTER — Other Ambulatory Visit: Payer: Self-pay

## 2021-04-26 ENCOUNTER — Ambulatory Visit (INDEPENDENT_AMBULATORY_CARE_PROVIDER_SITE_OTHER): Payer: Medicare Other

## 2021-04-26 DIAGNOSIS — L400 Psoriasis vulgaris: Secondary | ICD-10-CM

## 2021-04-26 NOTE — Progress Notes (Signed)
Total Surface Area: 44cm2 Total Energy: 53.20J

## 2021-05-01 ENCOUNTER — Ambulatory Visit (INDEPENDENT_AMBULATORY_CARE_PROVIDER_SITE_OTHER): Payer: Medicare Other

## 2021-05-01 ENCOUNTER — Other Ambulatory Visit: Payer: Self-pay

## 2021-05-01 DIAGNOSIS — L400 Psoriasis vulgaris: Secondary | ICD-10-CM

## 2021-05-01 NOTE — Progress Notes (Signed)
Total Surface Area: 48cm2 Total Energy: 58.03J

## 2021-05-03 ENCOUNTER — Ambulatory Visit: Payer: Medicare Other

## 2021-05-08 ENCOUNTER — Other Ambulatory Visit: Payer: Self-pay

## 2021-05-08 ENCOUNTER — Ambulatory Visit (INDEPENDENT_AMBULATORY_CARE_PROVIDER_SITE_OTHER): Payer: Medicare Other

## 2021-05-08 DIAGNOSIS — L400 Psoriasis vulgaris: Secondary | ICD-10-CM

## 2021-05-08 NOTE — Progress Notes (Signed)
Total Surface Area: 72cm2 Total Energy: 82.73J

## 2021-05-10 ENCOUNTER — Ambulatory Visit: Payer: Medicare Other

## 2021-05-15 ENCOUNTER — Ambulatory Visit: Payer: Medicare Other

## 2021-05-22 ENCOUNTER — Ambulatory Visit (INDEPENDENT_AMBULATORY_CARE_PROVIDER_SITE_OTHER): Payer: Medicare Other

## 2021-05-22 ENCOUNTER — Other Ambulatory Visit: Payer: Self-pay

## 2021-05-22 DIAGNOSIS — L400 Psoriasis vulgaris: Secondary | ICD-10-CM | POA: Diagnosis not present

## 2021-05-22 NOTE — Progress Notes (Signed)
Total Surface Area: 52cm2 Total Energy: 50.75J

## 2021-05-24 ENCOUNTER — Ambulatory Visit (INDEPENDENT_AMBULATORY_CARE_PROVIDER_SITE_OTHER): Payer: Medicare Other

## 2021-05-24 ENCOUNTER — Other Ambulatory Visit: Payer: Self-pay

## 2021-05-24 DIAGNOSIS — L400 Psoriasis vulgaris: Secondary | ICD-10-CM | POA: Diagnosis not present

## 2021-05-24 NOTE — Progress Notes (Signed)
Total Surface Area: 56cm2 Total Energy: 54.66J

## 2021-05-29 ENCOUNTER — Ambulatory Visit (INDEPENDENT_AMBULATORY_CARE_PROVIDER_SITE_OTHER): Payer: Medicare Other

## 2021-05-29 ENCOUNTER — Other Ambulatory Visit: Payer: Self-pay

## 2021-05-29 DIAGNOSIS — L400 Psoriasis vulgaris: Secondary | ICD-10-CM

## 2021-05-29 NOTE — Progress Notes (Signed)
Total Surface Area: 68cm2 Total Energy: 66.37J

## 2021-05-31 ENCOUNTER — Ambulatory Visit (INDEPENDENT_AMBULATORY_CARE_PROVIDER_SITE_OTHER): Payer: Medicare Other

## 2021-05-31 ENCOUNTER — Other Ambulatory Visit: Payer: Self-pay

## 2021-05-31 DIAGNOSIS — L400 Psoriasis vulgaris: Secondary | ICD-10-CM | POA: Diagnosis not present

## 2021-05-31 NOTE — Progress Notes (Signed)
Total Surface Area: 64cm2 Total Energy: 62.46J

## 2021-06-05 ENCOUNTER — Ambulatory Visit: Payer: Medicare Other

## 2021-06-07 ENCOUNTER — Ambulatory Visit (INDEPENDENT_AMBULATORY_CARE_PROVIDER_SITE_OTHER): Payer: Medicare Other

## 2021-06-07 ENCOUNTER — Other Ambulatory Visit: Payer: Self-pay

## 2021-06-07 DIAGNOSIS — L400 Psoriasis vulgaris: Secondary | ICD-10-CM

## 2021-06-07 NOTE — Progress Notes (Signed)
Total Surface Area: 48cm2 Total Energy: 51.55J

## 2021-06-12 ENCOUNTER — Ambulatory Visit: Payer: Medicare Other

## 2021-06-27 ENCOUNTER — Other Ambulatory Visit: Payer: Self-pay | Admitting: Internal Medicine

## 2021-06-27 DIAGNOSIS — R1084 Generalized abdominal pain: Secondary | ICD-10-CM

## 2021-06-27 DIAGNOSIS — R10814 Left lower quadrant abdominal tenderness: Secondary | ICD-10-CM

## 2021-07-04 ENCOUNTER — Other Ambulatory Visit: Payer: Self-pay

## 2021-07-04 ENCOUNTER — Ambulatory Visit (INDEPENDENT_AMBULATORY_CARE_PROVIDER_SITE_OTHER): Payer: Medicare Other | Admitting: Dermatology

## 2021-07-04 DIAGNOSIS — Z79899 Other long term (current) drug therapy: Secondary | ICD-10-CM | POA: Diagnosis not present

## 2021-07-04 DIAGNOSIS — L578 Other skin changes due to chronic exposure to nonionizing radiation: Secondary | ICD-10-CM

## 2021-07-04 DIAGNOSIS — L820 Inflamed seborrheic keratosis: Secondary | ICD-10-CM | POA: Diagnosis not present

## 2021-07-04 DIAGNOSIS — L409 Psoriasis, unspecified: Secondary | ICD-10-CM

## 2021-07-04 DIAGNOSIS — Z85828 Personal history of other malignant neoplasm of skin: Secondary | ICD-10-CM | POA: Diagnosis not present

## 2021-07-04 MED ORDER — CLOBETASOL PROPIONATE 0.05 % EX CREA: 1.0000 "application " | TOPICAL_CREAM | CUTANEOUS | 3 refills | Status: DC

## 2021-07-04 MED ORDER — OTEZLA 30 MG PO TABS: 30.0000 mg | ORAL_TABLET | Freq: Two times a day (BID) | ORAL | 6 refills | Status: DC

## 2021-07-04 MED ORDER — CALCIPOTRIENE 0.005 % EX CREA: TOPICAL_CREAM | CUTANEOUS | 4 refills | Status: DC

## 2021-07-04 MED ORDER — VTAMA 1 % EX CREA: 1.0000 "application " | TOPICAL_CREAM | Freq: Every day | CUTANEOUS | 6 refills | Status: DC

## 2021-07-04 NOTE — Progress Notes (Signed)
Follow-Up Visit   Subjective  Andrew Obrien is a 87 y.o. male who presents for the following: Psoriasis (Hands, 25m f/u, Otezla 30mg  1 po bid, no s/e from [place] no headaches, no weight loss, no depression Clobetasol cr 3x/wk, Calcipotriene cr 4x/wk, Xtrac in past). The patient has spots, moles and lesions to be evaluated, some may be new or changing and the patient has concerns that these could be cancer.  The following portions of the chart were reviewed this encounter and updated as appropriate:   Tobacco   Allergies   Meds   Problems   Med Hx   Surg Hx   Fam Hx      Review of Systems:  No other skin or systemic complaints except as noted in HPI or Assessment and Plan.  Objective  Well appearing patient in no apparent distress; mood and affect are within normal limits.  A focused examination was performed including hands. Relevant physical exam findings are noted in the Assessment and Plan.  bil hands L hand almost clear some peeling thumb, R hand more involvement fingers and palms  face, scalp, neck x 8 (8) Stuck on waxy paps with erythema    Assessment & Plan  Psoriasis bil hands   Psoriasis -severe, involving mostly the hands but improved on oral systemic Otezla and Clobetasol and calcipotriene, but persistent and not to goal.  Psoriasis is a chronic non-curable, but treatable genetic/hereditary disease that may have other systemic features affecting other organ systems such as joints (Psoriatic Arthritis). It is associated with an increased risk of inflammatory bowel disease, heart disease, non-alcoholic fatty liver disease, and depression.     Cont Otezla 30mg  1 po bid Cont Clobetasol cr 3x/wk aa hands prn flares, avoid f/g/a Cont Calcipotriene cr qhs Start Vtama cr qd to hands, samples x 2 given Lot [id number] 11/23  Side effects of Otezla (apremilast) include diarrhea, nausea, headache, upper respiratory infection, depression, and weight decrease (5-10%). It should only  be taken by pregnant women after a discussion regarding risks and benefits with their doctor. Goal is control of skin condition, not cure.  The use of R[place] Nail requires long term medication management, including periodic office visits.   Topical steroids (such as triamcinolone, fluocinolone, fluocinonide, mometasone, clobetasol, halobetasol, betamethasone, hydrocortisone) can cause thinning and lightening of the skin if they are used for too long in the same area. Your physician has selected the right strength medicine for your problem and area affected on the body. Please use your medication only as directed by your physician to prevent side effects.    Tapinarof (VTAMA) 1 % CREA - bil hands Apply 1 application topically daily. Qd to hands  Related Medications clobetasol cream (TEMOVATE) 0.93 % Apply 1 application topically 3 (three) times a week. Apply to hands at night on Mondays, Wednesdays and Fridays, avoid face, groin, axilla  calcipotriene (DOVONOX) 0.005 % cream Apply to hands a bedtime  Apremilast (OTEZLA) 30 MG TABS Take 1 tablet (30 mg total) by mouth 2 (two) times daily.  Inflamed seborrheic keratosis (8) face, scalp, neck x 8  Destruction of lesion - face, scalp, neck x 8 Complexity: simple   Destruction method: cryotherapy   Informed consent: discussed and consent obtained   Timeout:  patient name, date of birth, surgical site, and procedure verified Lesion destroyed using liquid nitrogen: Yes   Region frozen until ice ball extended beyond lesion: Yes   Outcome: patient tolerated procedure well with no complications   Post-procedure details:  wound care instructions given    Actinic Damage - chronic, secondary to cumulative UV radiation exposure/sun exposure over time - diffuse scaly erythematous macules with underlying dyspigmentation - Recommend daily broad spectrum sunscreen SPF 30+ to sun-exposed areas, reapply every 2 hours as needed.  - Recommend staying in the  shade or wearing long sleeves, sun glasses (UVA+UVB protection) and wide brim hats (4-inch brim around the entire circumference of the hat). - Call for new or changing lesions.  History of Basal Cell Carcinoma of the Skin - No evidence of recurrence today - Recommend regular full body skin exams - Recommend daily broad spectrum sunscreen SPF 30+ to sun-exposed areas, reapply every 2 hours as needed.  - Call if any new or changing lesions are noted between office visits   Return in about 6 months (around 01/01/2022) for Psoriasis f/u.  I, Othelia Pulling, RMA, am acting as scribe for Sarina Ser, MD .  Documentation: I have reviewed the above documentation for accuracy and completeness, and I agree with the above.  Sarina Ser, MD

## 2021-07-04 NOTE — Patient Instructions (Signed)

## 2021-07-08 ENCOUNTER — Encounter: Payer: Self-pay | Admitting: Dermatology

## 2021-07-17 ENCOUNTER — Ambulatory Visit: Admission: RE | Admit: 2021-07-17 | Payer: Medicare Other | Source: Ambulatory Visit

## 2021-07-25 ENCOUNTER — Ambulatory Visit: Payer: Medicare Other | Admitting: Dermatology

## 2021-07-26 ENCOUNTER — Other Ambulatory Visit: Payer: Self-pay

## 2021-07-26 ENCOUNTER — Ambulatory Visit (INDEPENDENT_AMBULATORY_CARE_PROVIDER_SITE_OTHER): Payer: Medicare Other | Admitting: Dermatology

## 2021-07-26 DIAGNOSIS — D489 Neoplasm of uncertain behavior, unspecified: Secondary | ICD-10-CM

## 2021-07-26 DIAGNOSIS — L578 Other skin changes due to chronic exposure to nonionizing radiation: Secondary | ICD-10-CM

## 2021-07-26 DIAGNOSIS — C44212 Basal cell carcinoma of skin of right ear and external auricular canal: Secondary | ICD-10-CM | POA: Diagnosis not present

## 2021-07-26 NOTE — Patient Instructions (Addendum)
Biopsy Wound Care Instructions  Leave the original bandage on for 24 hours if possible.  If the bandage becomes soaked or soiled before that time, it is OK to remove it and examine the wound.  A small amount of post-operative bleeding is normal.  If excessive bleeding occurs, remove the bandage, place gauze over the site and apply continuous pressure (no peeking) over the area for 30 minutes. If this does not work, please call our clinic as soon as possible or page your doctor if it is after hours.   Once a day, cleanse the wound with soap and water. It is fine to shower. If a thick crust develops you may use a Q-tip dipped into dilute hydrogen peroxide (mix 1:1 with water) to dissolve it.  Hydrogen peroxide can slow the healing process, so use it only as needed.    After washing, apply petroleum jelly (Vaseline) or an antibiotic ointment if your doctor prescribed one for you, followed by a bandage.    For best healing, the wound should be covered with a layer of ointment at all times. If you are not able to keep the area covered with a bandage to hold the ointment in place, this may mean re-applying the ointment several times a day.  Continue this wound care until the wound has healed and is no longer open.   Itching and mild discomfort is normal during the healing process. However, if you develop pain or severe itching, please call our office.   If you have any discomfort, you can take Tylenol (acetaminophen) or ibuprofen as directed on the bottle. (Please do not take these if you have an allergy to them or cannot take them for another reason).  Some redness, tenderness and white or yellow material in the wound is normal healing.  If the area becomes very sore and red, or develops a thick yellow-green material (pus), it may be infected; please notify us.    If you have stitches, return to clinic as directed to have the stitches removed. You will continue wound care for 2-3 days after the stitches  are removed.   Wound healing continues for up to one year following surgery. It is not unusual to experience pain in the scar from time to time during the interval.  If the pain becomes severe or the scar thickens, you should notify the office.    A slight amount of redness in a scar is expected for the first six months.  After six months, the redness will fade and the scar will soften and fade.  The color difference becomes less noticeable with time.  If there are any problems, return for a post-op surgery check at your earliest convenience.  To improve the appearance of the scar, you can use silicone scar gel, cream, or sheets (such as Mederma or Serica) every night for up to one year. These are available over the counter (without a prescription).  Please call our office at [phone] for any questions or concerns.    Electrodesiccation and Curettage (Scrape and Burn) Wound Care Instructions  Leave the original bandage on for 24 hours if possible.  If the bandage becomes soaked or soiled before that time, it is OK to remove it and examine the wound.  A small amount of post-operative bleeding is normal.  If excessive bleeding occurs, remove the bandage, place gauze over the site and apply continuous pressure (no peeking) over the area for 30 minutes. If this does not work, please call  our clinic as soon as possible or page your doctor if it is after hours.   Once a day, cleanse the wound with soap and water. It is fine to shower. If a thick crust develops you may use a Q-tip dipped into dilute hydrogen peroxide (mix 1:1 with water) to dissolve it.  Hydrogen peroxide can slow the healing process, so use it only as needed.    After washing, apply petroleum jelly (Vaseline) or an antibiotic ointment if your doctor prescribed one for you, followed by a bandage.    For best healing, the wound should be covered with a layer of ointment at all times. If you are not able to keep the area covered  with a bandage to hold the ointment in place, this may mean re-applying the ointment several times a day.  Continue this wound care until the wound has healed and is no longer open. It may take several weeks for the wound to heal and close.  Itching and mild discomfort is normal during the healing process.  If you have any discomfort, you can take Tylenol (acetaminophen) or ibuprofen as directed on the bottle. (Please do not take these if you have an allergy to them or cannot take them for another reason).  Some redness, tenderness and white or yellow material in the wound is normal healing.  If the area becomes very sore and red, or develops a thick yellow-green material (pus), it may be infected; please notify us.    Wound healing continues for up to one year following surgery. It is not unusual to experience pain in the scar from time to time during the interval.  If the pain becomes severe or the scar thickens, you should notify the office.    A slight amount of redness in a scar is expected for the first six months.  After six months, the redness will fade and the scar will soften and fade.  The color difference becomes less noticeable with time.  If there are any problems, return for a post-op surgery check at your earliest convenience.  To improve the appearance of the scar, you can use silicone scar gel, cream, or sheets (such as Mederma or Serica) every night for up to one year. These are available over the counter (without a prescription).  Please call our office at [phone] for any questions or concerns.       if You Need Anything After Your Visit  If you have any questions or concerns for your doctor, please call our main line at [phone] and press option 4 to reach your doctor's medical assistant. If no one answers, please leave a voicemail as directed and we will return your call as soon as possible. Messages left after 4 pm will be answered the following business day.    You may also send [place] a message via [place]. We typically respond to MyChart messages within 1-2 business days.  For prescription refills, please ask your pharmacy to contact our office. Our fax number is [phone].  If you have an urgent issue when the clinic is closed that cannot wait until the next business day, you can page your doctor at the number below.    Please note that while we do our best to be available for urgent issues outside of office hours, we are not available 24/7.   If you have an urgent issue and are unable to reach [place], you may choose to seek medical care at your doctor's office, retail  clinic, urgent care center, or emergency room.  If you have a medical emergency, please immediately call 911 or go to the emergency department.  Pager Numbers  - Dr. Nehemiah Massed: [phone]  - Dr. Laurence Ferrari: [phone]  - Dr. Nicole Kindred: [phone]  In the event of inclement weather, please call our main line at [phone] for an update on the status of any delays or closures.  Dermatology Medication Tips: Please keep the boxes that topical medications come in in order to help keep track of the instructions about where and how to use these. Pharmacies typically print the medication instructions only on the boxes and not directly on the medication tubes.   If your medication is too expensive, please contact our office at [phone] option 4 or send [place] a message through [place].   We are unable to tell what your co-pay for medications will be in advance as this is different depending on your insurance coverage. However, we may be able to find a substitute medication at lower cost or fill out paperwork to get insurance to cover a needed medication.   If a prior authorization is required to get your medication covered by your insurance company, please allow [place] 1-2 business days to complete this process.  Drug prices often vary depending on where the prescription is filled and some  pharmacies may offer cheaper prices.  The website www.goodrx.com contains coupons for medications through different pharmacies. The prices here do not account for what the cost may be with help from insurance (it may be cheaper with your insurance), but the website can give you the price if you did not use any insurance.  - You can print the associated coupon and take it with your prescription to the pharmacy.  - You may also stop by our office during regular business hours and pick up a GoodRx coupon card.  - If you need your prescription sent electronically to a different pharmacy, notify our office through [facility] or by phone at [phone] option 4.     Si Usted Necesita Algo Despus de Su Visita  Tambin puede enviarnos un mensaje a travs de Pharmacist, community. Por lo general respondemos a los mensajes de MyChart en el transcurso de 1 a 2 das hbiles.  Para renovar recetas, por favor pida a su farmacia que se ponga en contacto con nuestra oficina. Harland Dingwall de fax es [place] [phone].  Si tiene un asunto urgente cuando la clnica est cerrada y que no puede esperar hasta el siguiente da hbil, puede llamar/localizar a su doctor(a) al nmero que aparece a continuacin.   Por favor, tenga en cuenta que aunque hacemos todo lo posible para estar disponibles para asuntos urgentes fuera del horario de [place], no estamos disponibles las 24 horas del da, los 7 das de la [place].   Si tiene un problema urgente y no puede comunicarse con nosotros, puede optar por buscar atencin mdica  en el consultorio de su doctor(a), en una clnica privada, en un centro de atencin urgente o en una sala de emergencias.  Si tiene Engineering geologist, por favor llame inmediatamente al 911 o vaya a la sala de emergencias.  Nmeros de bper  - Dr. Nehemiah Massed: [phone]  - Dra. Moye: [phone]  - Dra. Nicole Kindred: [phone]  En caso de inclemencias del [place], por favor llame a Johnsie Kindred  principal al [phone] para una actualizacin sobre el [place] de cualquier retraso o cierre.  Consejos para la medicacin en dermatologa: Por favor, guarde las  cajas en las que vienen los medicamentos de uso tpico para ayudarle a seguir las instrucciones sobre dnde y cmo usarlos. Las farmacias generalmente imprimen las instrucciones del medicamento slo en las cajas y no directamente en los tubos del [place].   Si su medicamento es muy caro, por favor, pngase en contacto con Zigmund Daniel llamando al [phone] y presione la opcin 4 o envenos un mensaje a travs de Pharmacist, community.   No podemos decirle cul ser su copago por los medicamentos por adelantado ya que esto es diferente dependiendo de la cobertura de su seguro. Sin embargo, es posible que podamos encontrar un medicamento sustituto a Electrical engineer un formulario para que el seguro cubra el medicamento que se considera necesario.   Si se requiere una autorizacin previa para que su compaa de seguros [place] su medicamento, por favor permtanos de 1 a 2 das hbiles para completar este proceso.  Los precios de los medicamentos varan con frecuencia dependiendo del Environmental consultant de dnde se surte la receta y alguna farmacias pueden ofrecer precios ms baratos.  El sitio web www.goodrx.com tiene cupones para medicamentos de Airline pilot. Los precios aqu no tienen en cuenta lo que podra costar con la ayuda del seguro (puede ser ms barato con su seguro), pero el sitio web puede darle el precio si no utiliz Research scientist (physical sciences).  - Puede imprimir el cupn correspondiente y llevarlo con su receta a la farmacia.  - Tambin puede pasar por nuestra oficina durante el horario de atencin regular y Charity fundraiser una tarjeta de cupones de GoodRx.  - Si necesita que su receta se enve electrnicamente a una farmacia diferente, informe a nuestra oficina a travs de MyChart de  o por telfono llamando al [phone] y presione la opcin  4.

## 2021-07-26 NOTE — Progress Notes (Signed)
° °  Follow-Up Visit   Subjective  Andrew Obrien is a 87 y.o. male who presents for the following: Follow-up (Patient here today concerning a spot about right ear. Patient reports that sore is from his cpap machine. ). The patient has spots, moles and lesions to be evaluated, some may be new or changing and the patient has concerns that these could be cancer.  The following portions of the chart were reviewed this encounter and updated as appropriate:  Tobacco   Allergies   Meds   Problems   Med Hx   Surg Hx   Fam Hx      Review of Systems: No other skin or systemic complaints except as noted in HPI or Assessment and Plan.  Objective  Well appearing patient in no apparent distress; mood and affect are within normal limits.  A focused examination was performed including right posterior ear. Relevant physical exam findings are noted in the Assessment and Plan.  right ear posterior inferior aspect 1 cm crusted papule    Assessment & Plan  Neoplasm of uncertain behavior right ear posterior inferior aspect  Epidermal / dermal shaving  Lesion diameter (cm):  1 Informed consent: discussed and consent obtained   Timeout: patient name, date of birth, surgical site, and procedure verified   Procedure prep:  Patient was prepped and draped in usual sterile fashion Prep type:  Isopropyl alcohol Anesthesia: the lesion was anesthetized in a standard fashion   Anesthetic:  1% lidocaine w/ epinephrine 1-100,000 buffered w/ 8.4% NaHCO3 Instrument used: flexible razor blade   Hemostasis achieved with: pressure, aluminum chloride and electrodesiccation   Outcome: patient tolerated procedure well   Post-procedure details: sterile dressing applied and wound care instructions given   Dressing type: bandage and petrolatum    Destruction of lesion Complexity: extensive   Destruction method: electrodesiccation and curettage   Informed consent: discussed and consent obtained   Timeout:  patient name,  date of birth, surgical site, and procedure verified Procedure prep:  Patient was prepped and draped in usual sterile fashion Prep type:  Isopropyl alcohol Anesthesia: the lesion was anesthetized in a standard fashion   Anesthetic:  1% lidocaine w/ epinephrine 1-100,000 buffered w/ 8.4% NaHCO3 Curettage performed in three different directions: Yes   Electrodesiccation performed over the curetted area: Yes   Lesion length (cm):  1 Lesion width (cm):  1 Margin per side (cm):  0.2 Final wound size (cm):  1.4 Hemostasis achieved with:  pressure, aluminum chloride and electrodesiccation Outcome: patient tolerated procedure well with no complications   Post-procedure details: sterile dressing applied and wound care instructions given   Dressing type: bandage and petrolatum    Specimen 1 - Surgical pathology Differential Diagnosis: r/o bcc  Check Margins: No R/o bcc  Actinic Damage - chronic, secondary to cumulative UV radiation exposure/sun exposure over time - diffuse scaly erythematous macules with underlying dyspigmentation - Recommend daily broad spectrum sunscreen SPF 30+ to sun-exposed areas, reapply every 2 hours as needed.  - Recommend staying in the shade or wearing long sleeves, sun glasses (UVA+UVB protection) and wide brim hats (4-inch brim around the entire circumference of the hat). - Call for new or changing lesions.  Return for keep follow up as scheduled . IRuthell Rummage, CMA, am acting as scribe for Sarina Ser, MD. Documentation: I have reviewed the above documentation for accuracy and completeness, and I agree with the above.  Sarina Ser, MD

## 2021-07-28 ENCOUNTER — Encounter: Payer: Self-pay | Admitting: Dermatology

## 2021-07-30 ENCOUNTER — Telehealth: Payer: Self-pay

## 2021-07-30 NOTE — Telephone Encounter (Signed)
-----   Message from Ralene Bathe, MD sent at 07/28/2021  3:25 PM EST ----- Diagnosis Skin (M), right ear posterior inferior aspect BASAL CELL CARCINOMA, NODULAR PATTERN, CLOSE TO MARGIN  Cancer - BCC Already treated Recheck next visit

## 2021-07-30 NOTE — Telephone Encounter (Signed)
Left message for patient to call office for results/hd 

## 2021-07-30 NOTE — Telephone Encounter (Signed)
Spoke with pt and informed him of results. He had no concerns.  

## 2021-09-12 ENCOUNTER — Other Ambulatory Visit: Payer: Self-pay | Admitting: Physician Assistant

## 2021-09-12 ENCOUNTER — Ambulatory Visit
Admission: RE | Admit: 2021-09-12 | Discharge: 2021-09-12 | Disposition: A | Discharge status: Home / Self Care | Payer: Medicare Other | Source: Ambulatory Visit | Attending: Physician Assistant | Admitting: Physician Assistant

## 2021-09-12 DIAGNOSIS — R1032 Left lower quadrant pain: Secondary | ICD-10-CM

## 2021-09-12 LAB — POCT I-STAT CREATININE: Creatinine, Ser: 2.2 mg/dL — ABNORMAL HIGH (ref 0.61–1.24)

## 2021-09-12 MED ORDER — IOHEXOL 300 MG/ML  SOLN: 100.0000 mL | Freq: Once | INTRAMUSCULAR | Status: DC | PRN

## 2021-09-27 ENCOUNTER — Telehealth: Payer: Self-pay

## 2021-09-27 NOTE — Telephone Encounter (Signed)
Otezla RFs OKed on the phone with Iantha Fallen. aw ?

## 2021-11-09 ENCOUNTER — Other Ambulatory Visit: Payer: Self-pay | Admitting: Gastroenterology

## 2021-11-09 DIAGNOSIS — R1084 Generalized abdominal pain: Secondary | ICD-10-CM

## 2021-11-09 DIAGNOSIS — R634 Abnormal weight loss: Secondary | ICD-10-CM

## 2021-11-09 DIAGNOSIS — R194 Change in bowel habit: Secondary | ICD-10-CM

## 2021-11-09 DIAGNOSIS — K529 Noninfective gastroenteritis and colitis, unspecified: Secondary | ICD-10-CM

## 2021-11-09 DIAGNOSIS — R1314 Dysphagia, pharyngoesophageal phase: Secondary | ICD-10-CM

## 2021-11-09 DIAGNOSIS — R1013 Epigastric pain: Secondary | ICD-10-CM

## 2021-11-13 ENCOUNTER — Other Ambulatory Visit: Payer: Self-pay | Admitting: Specialist

## 2021-11-13 DIAGNOSIS — R053 Chronic cough: Secondary | ICD-10-CM

## 2021-11-13 DIAGNOSIS — J849 Interstitial pulmonary disease, unspecified: Secondary | ICD-10-CM

## 2021-11-13 DIAGNOSIS — R1310 Dysphagia, unspecified: Secondary | ICD-10-CM

## 2021-11-21 ENCOUNTER — Ambulatory Visit
Admission: RE | Admit: 2021-11-21 | Discharge: 2021-11-21 | Disposition: A | Discharge status: Home / Self Care | Payer: Medicare Other | Source: Ambulatory Visit | Attending: Specialist | Admitting: Specialist

## 2021-11-21 DIAGNOSIS — J849 Interstitial pulmonary disease, unspecified: Secondary | ICD-10-CM

## 2021-11-29 ENCOUNTER — Ambulatory Visit
Admission: RE | Admit: 2021-11-29 | Discharge: 2021-11-29 | Disposition: A | Discharge status: Home / Self Care | Payer: Medicare Other | Source: Ambulatory Visit | Attending: Specialist | Admitting: Specialist

## 2021-11-29 DIAGNOSIS — R1310 Dysphagia, unspecified: Secondary | ICD-10-CM

## 2021-11-29 DIAGNOSIS — R053 Chronic cough: Secondary | ICD-10-CM | POA: Diagnosis present

## 2021-11-29 NOTE — Therapy (Signed)
[place], [place], [address] Phone: [phone]   Fax:     Modified Barium Swallow  Patient Details  Name: Andrew Obrien MRN: [id number] Date of Birth: August 13, 1934 No data recorded  Encounter Date: 11/29/2021   End of Session - 11/29/21 1414     Visit Number 1    Number of Visits 1    Date for SLP Re-Evaluation 11/29/21    SLP Start Time 1245    SLP Stop Time  0626    SLP Time Calculation (min) 30 min             Past Medical History:  Diagnosis Date   Arthritis    Basal cell carcinoma 08/21/2020   right ear mid helix, [facility] 10/17/20   Basal cell carcinoma 07/28/2021   right ear posterior inferior aspect, [facility]   Depression    Diabetes mellitus without complication ([place])    Difficult intubation    GERD (gastroesophageal reflux disease)    Heart murmur    Hx of basal cell carcinoma 2001, 2002   R cheek txted in [place]   Hx of basal cell carcinoma 2004   L cheek txted in [place]   Hx of basal cell carcinoma 2005   Forehead txted in [place]   Hx of basal cell carcinoma 2006   Back, txted in [place]   Hx of basal cell carcinoma 2008   R post shoulder txted in [place]   Hx of basal cell carcinoma 2009   Face and Back, L neck txted in [place]   Hx of basal cell carcinoma 2015   L temple, R ear x 2 txted by Dr. Blane Ohara [place]   Hx of basal cell carcinoma 2016   Back txted by Dr. Rayburn Felt [place]   Hx of basal cell carcinoma 2017   L face x 2 txted by Dr. Blane Ohara [place]   Hx of basal cell carcinoma 2020   Multiple face, back, shoulders txted by Dr. Evorn Gong   Hypertension    Macular degeneration    Right   Melanoma ([place]) 1973   L cheek txted in [place]   Neuropathy    OSA on CPAP    Prostate cancer ([place])    Psoriasis    Squamous cell carcinoma of skin 2006   SCCIS back txted in [place]    Past Surgical History:  Procedure Laterality Date   CATARACT EXTRACTION W/ INTRAOCULAR LENS  IMPLANT,  BILATERAL     LUMBAR SPINE [place]     prostate cancer     REPLACEMENT TOTAL KNEE Right    x 2   ROTATOR CUFF REPAIR Left    SKIN CANCER EXCISION     TOTAL HIP ARTHROPLASTY Right 04/14/2017   Procedure: TOTAL HIP ARTHROPLASTY;  Surgeon: Dereck Leep, MD;  Location: [facility] ORS;  Service: Orthopedics;  Laterality: Right;   TOTAL SHOULDER REPLACEMENT Left     There were no vitals filed for this visit.   Subjective Assessment - 11/29/21 1333     Subjective Patient reports foreign body sensation with solids and pills.    Currently in Pain? No/denies               11/29/21 1300  SLP Visit Information  SLP Received On 11/29/21  Pain Assessment  Pain Assessment No/denies pain  General Information  Date of Onset 11/14/21  HPI Patient is an 87 year old male referred for MBS by his pulmonologist, Dr. Raul Del. Follows with GI; upper GI has been ordered but not yet scheduled per chart review; pt complained of difficulty with solids and pills. 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 CT Chest 11/21/21: "Moderate pulmonary fibrosis in a pattern with apical to basal gradient featuring irregular peripheral interstitial opacity, septal thickening, small areas of traction bronchiectasis and subpleural bronchiolectasis at the lung bases without clear evidence of honeycombing."  Type of Study MBS-Modified Barium Swallow Study  Previous Swallow Assessment none on file  Diet Prior to this Study Regular;Thin liquids  Temperature Spikes Noted No  Respiratory Status Room air  History of Recent Intubation No  Behavior/Cognition Alert;Cooperative;Pleasant mood  Oral Cavity Assessment WFL  Oral Care Completed by SLP No  Oral Cavity - Dentition Other (Comment) (partials, top and bottom)  Vision Functional for self feeding  Self-Feeding Abilities Able to feed self  Patient Positioning  Upright in chair  Baseline Vocal Quality Normal  Volitional Cough Strong  Volitional Swallow Able to elicit  Anatomy [facility]  Pharyngeal Secretions Not observed secondary MBS  Oral Motor/Sensory Function  Overall Oral Motor/Sensory Function Mild impairment  Facial ROM WFL  Facial Symmetry WFL  Facial Strength WFL  Facial Sensation WFL  Lingual ROM WFL  Lingual Symmetry WFL  Lingual Strength Reduced (mildly reduced bilaterally)  Lingual Sensation WFL  Velum WFL  Mandible WFL  Oral Preparation/Oral Phase  Oral Phase WFL  Oral - Pudding  Oral - Pudding Teaspoon Lingual/palatal residue (mild, clears with second swallow)  Pharyngeal Phase  Pharyngeal Phase WFL  Pharyngeal - Pudding  Pharyngeal- Pudding Teaspoon Reduced tongue base retraction;Pharyngeal residue - valleculae;Pharyngeal residue - pyriform  Pharyngeal Material does not enter airway  Pharyngeal - Honey  Pharyngeal- Honey Teaspoon Reduced tongue base retraction;Pharyngeal residue - valleculae;Pharyngeal residue - pyriform  Pharyngeal Material does not enter airway  Pharyngeal - Nectar  Pharyngeal- Nectar Teaspoon Reduced tongue base retraction;Pharyngeal residue - valleculae;Pharyngeal residue - pyriform  Pharyngeal Material does not enter airway  Pharyngeal- Nectar Cup Reduced tongue base retraction;Pharyngeal residue - valleculae;Pharyngeal residue - pyriform  Pharyngeal Material does not enter airway  Pharyngeal - Thin  Pharyngeal- Thin Teaspoon Reduced tongue base retraction;Pharyngeal residue - valleculae  Pharyngeal Material does not enter airway  Pharyngeal- Thin Cup Reduced tongue base retraction;Pharyngeal residue - valleculae  Pharyngeal Material does not enter airway  Pharyngeal - Solids  Pharyngeal- Regular Reduced tongue base retraction;Pharyngeal residue - valleculae;Reduced pharyngeal peristalsis  Pharyngeal Material does not enter airway  Cervical Esophageal Phase  Cervical Esophageal Phase [facility]   Clinical Impression  Clinical Impression Patient presents with grossly functional oropharyngeal swallowing for age. Oral stage is characterized by adequate lip closure, bolus preparation and containment, and anterior to posterior transit. Swallow initiation occurs at the level of the valleculae. Pharyngeal stage is noted for mildly reduced tongue base retraction (likely age-related), adequate hyolaryngeal excursion, and adequate pharyngeal constriction. Epiglottic deflection is complete; there is no penetration or aspiration. There is mild valleculae and intermittently pyriform sinus residue, greater with thicker and solid consistencies, which is cleared by dry swallow. Pharyngeal stripping wave is complete. Amplitude/duration of cricopharyngeus opening is WFL. There is adequate/complete clearance through the cervical esophagus. An esophageal sweep was performed in the upright lateral position which was unremarkable. 13 mm barium tablet was retained in the valleculae but pt cleared independently with subsequent swallow. Consistencies tested  were thin liquids x2 tsps, 1 cup sip, 3 sequential sips, nectar x1 tsp, 1 cup sip, 3 sequential cup sips, honey x1 tsp, pudding x1 tsp, regular solid (1/2 graham cracker with pudding), and 13 mm barium tablet with cup sips of nectar. Pt reported globus sensation at level of thyroid notch even when no contrast/residue remained in the pharynx. Delayed throat clearing ~3-5 minutes after presentation of POs; patient has esophageal w/u pending. Recommend patient continue regular diet with thin liquids; educated pt verbally and in handout form re: strategies including moistening dry meats, alternating solids and liquids, swallowing twice. No further ST indicated.  SLP Visit Diagnosis Dysphagia, oropharyngeal phase (R13.12)  Impact on safety and function No limitations  Swallow Evaluation Recommendations  Recommended Consults Consider esophageal assessment  SLP Diet  Recommendations Regular solids;Thin liquid  Liquid Administration via Cup  Medication Administration Whole meds with liquid  Compensations Slow rate;Small sips/bites;Multiple dry swallows after each bite/sip;Follow solids with liquid  Postural Changes Remain semi-upright after after feeds/meals (Comment);Seated upright at 90 degrees  Treatment Plan  Oral Care Recommendations Oral care BID  Treatment Recommendations No treatment recommended at this time  Individuals Consulted  Consulted and Agree with Results and Recommendations Patient  Report Sent to  Referring physician  Progression Toward Goals  Progression toward goals Goals met, education completed, patient discharged from SLP  SLP Time Calculation  SLP Start Time (ACUTE ONLY) 1245  SLP Stop Time (ACUTE ONLY) 1315  SLP Time Calculation (min) (ACUTE ONLY) 30 min  SLP Evaluations  $ SLP Speech Visit 1 Visit  SLP Evaluations  $Outpatient MBS Swallow 1 Procedure     Dysphagia, unspecified type - Plan: DG SWALLOW FUNC OP MEDICARE SPEECH PATH, DG SWALLOW FUNC OP MEDICARE SPEECH PATH  Chronic cough - Plan: DG SWALLOW FUNC OP MEDICARE SPEECH PATH, DG SWALLOW FUNC OP MEDICARE SPEECH PATH        Problem List Patient Active Problem List   Diagnosis Date Noted   Hypoglycemia 05/23/2020   Diabetic hypoglycemia ([place]) 05/22/2020   AKI (acute kidney injury) ([place]) 05/22/2020   Pain due to onychomycosis of toenails of both feet 10/18/2019   Diabetic neuropathy ([place]) 03/50/0938   Systolic murmur 18/29/9371   Anemia of chronic disease 04/30/2019   CKD (chronic kidney disease) stage 3, GFR 30-59 ml/min ([place]) 12/28/2018   Psoriasis 08/25/2018   Personal history of gout 04/13/2018   History of total right knee replacement (x2) 01/06/2018   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   Bilateral primary osteoarthritis of knee 01/06/2018   Neuropathic pain 01/06/2018   Spinal cord stimulator status 01/06/2018   Chronic pain syndrome  01/06/2018   Right leg numbness 12/10/2017   Right leg weakness 12/10/2017   DNR (do not resuscitate)/DNI(Do Not Intubate) 11/28/2017   Encounter for general adult medical examination without abnormal findings 11/28/2017   Acquired trigger finger of left ring finger 10/28/2017   Status post total replacement of hip 04/14/2017   Mixed hyperlipidemia 03/03/2017   Deneise Lever, MS, CCC-SLP Speech-Language Pathologist [phone]  Aliene Altes, [place] 11/29/2021, 2:16 PM  [facility] [place], [place], [address] Phone: [phone]   Fax:     Name: Andrew Obrien MRN: [id number] Date of Birth: 1935/02/25

## 2021-12-27 ENCOUNTER — Ambulatory Visit
Admission: RE | Admit: 2021-12-27 | Discharge: 2021-12-27 | Disposition: A | Discharge status: Home / Self Care | Payer: Medicare Other | Source: Ambulatory Visit | Attending: Gastroenterology | Admitting: Gastroenterology

## 2021-12-27 DIAGNOSIS — K529 Noninfective gastroenteritis and colitis, unspecified: Secondary | ICD-10-CM | POA: Diagnosis present

## 2021-12-27 DIAGNOSIS — R1084 Generalized abdominal pain: Secondary | ICD-10-CM | POA: Diagnosis present

## 2021-12-27 DIAGNOSIS — R1314 Dysphagia, pharyngoesophageal phase: Secondary | ICD-10-CM | POA: Diagnosis present

## 2021-12-27 DIAGNOSIS — R634 Abnormal weight loss: Secondary | ICD-10-CM | POA: Diagnosis present

## 2021-12-27 DIAGNOSIS — R1013 Epigastric pain: Secondary | ICD-10-CM | POA: Diagnosis present

## 2021-12-27 DIAGNOSIS — R194 Change in bowel habit: Secondary | ICD-10-CM | POA: Insufficient documentation

## 2022-01-03 ENCOUNTER — Ambulatory Visit (INDEPENDENT_AMBULATORY_CARE_PROVIDER_SITE_OTHER): Payer: Medicare Other | Admitting: Dermatology

## 2022-01-03 DIAGNOSIS — Z85828 Personal history of other malignant neoplasm of skin: Secondary | ICD-10-CM | POA: Diagnosis not present

## 2022-01-03 DIAGNOSIS — L409 Psoriasis, unspecified: Secondary | ICD-10-CM

## 2022-01-03 DIAGNOSIS — L570 Actinic keratosis: Secondary | ICD-10-CM

## 2022-01-03 DIAGNOSIS — D485 Neoplasm of uncertain behavior of skin: Secondary | ICD-10-CM

## 2022-01-03 DIAGNOSIS — C44319 Basal cell carcinoma of skin of other parts of face: Secondary | ICD-10-CM | POA: Diagnosis not present

## 2022-01-03 DIAGNOSIS — C44310 Basal cell carcinoma of skin of unspecified parts of face: Secondary | ICD-10-CM

## 2022-01-03 DIAGNOSIS — L820 Inflamed seborrheic keratosis: Secondary | ICD-10-CM | POA: Diagnosis not present

## 2022-01-03 DIAGNOSIS — L578 Other skin changes due to chronic exposure to nonionizing radiation: Secondary | ICD-10-CM | POA: Diagnosis not present

## 2022-01-03 NOTE — Patient Instructions (Signed)
Cryotherapy Aftercare  Wash gently with soap and water everyday.   Apply Vaseline and Band-Aid daily until healed.    Wound Care Instructions  Cleanse wound gently with soap and water once a day then pat dry with clean gauze. Apply a thing coat of Petrolatum (petroleum jelly, "Vaseline") over the wound (unless you have an allergy to this). We recommend that you use a new, sterile tube of Vaseline. Do not pick or remove scabs. Do not remove the yellow or white "healing tissue" from the base of the wound.  Cover the wound with fresh, clean, nonstick gauze and secure with paper tape. You may use Band-Aids in place of gauze and tape if the would is small enough, but would recommend trimming much of the tape off as there is often too much. Sometimes Band-Aids can irritate the skin.  You should call the office for your biopsy report after 1 week if you have not already been contacted.  If you experience any problems, such as abnormal amounts of bleeding, swelling, significant bruising, significant pain, or evidence of infection, please call the office immediately.  FOR ADULT SURGERY PATIENTS: If you need something for pain relief you may take 1 extra strength Tylenol (acetaminophen) AND 2 Ibuprofen (200mg each) together every 4 hours as needed for pain. (do not take these if you are allergic to them or if you have a reason you should not take them.) Typically, you may only need pain medication for 1 to 3 days.       Due to recent changes in healthcare laws, you may see results of your pathology and/or laboratory studies on MyChart before the doctors have had a chance to review them. We understand that in some cases there may be results that are confusing or concerning to you. Please understand that not all results are received at the same time and often the doctors may need to interpret multiple results in order to provide you with the best plan of care or course of treatment. Therefore, we ask  that you please give us 2 business days to thoroughly review all your results before contacting the office for clarification. Should we see a critical lab result, you will be contacted sooner.   If You Need Anything After Your Visit  If you have any questions or concerns for your doctor, please call our main line at [phone] and press option 4 to reach your doctor's medical assistant. If no one answers, please leave a voicemail as directed and we will return your call as soon as possible. Messages left after 4 pm will be answered the following business day.   You may also send us a message via MyChart. We typically respond to MyChart messages within 1-2 business days.  For prescription refills, please ask your pharmacy to contact our office. Our fax number is [phone].  If you have an urgent issue when the clinic is closed that cannot wait until the next business day, you can page your doctor at the number below.    Please note that while we do our best to be available for urgent issues outside of office hours, we are not available 24/7.   If you have an urgent issue and are unable to reach us, you may choose to seek medical care at your doctor's office, retail clinic, urgent care center, or emergency room.  If you have a medical emergency, please immediately call 911 or go to the emergency department.  Pager Numbers  - Dr. Kowalski:   [phone]  - Dr. Moye: [phone]  - Dr. Stewart: [phone]  In the event of inclement weather, please call our main line at [phone] for an update on the status of any delays or closures.  Dermatology Medication Tips: Please keep the boxes that topical medications come in in order to help keep track of the instructions about where and how to use these. Pharmacies typically print the medication instructions only on the boxes and not directly on the medication tubes.   If your medication is too expensive, please contact our office at  [phone] option 4 or send us a message through MyChart.   We are unable to tell what your co-pay for medications will be in advance as this is different depending on your insurance coverage. However, we may be able to find a substitute medication at lower cost or fill out paperwork to get insurance to cover a needed medication.   If a prior authorization is required to get your medication covered by your insurance company, please allow us 1-2 business days to complete this process.  Drug prices often vary depending on where the prescription is filled and some pharmacies may offer cheaper prices.  The website www.goodrx.com contains coupons for medications through different pharmacies. The prices here do not account for what the cost may be with help from insurance (it may be cheaper with your insurance), but the website can give you the price if you did not use any insurance.  - You can print the associated coupon and take it with your prescription to the pharmacy.  - You may also stop by our office during regular business hours and pick up a GoodRx coupon card.  - If you need your prescription sent electronically to a different pharmacy, notify our office through [facility] MyChart or by phone at [phone] option 4.     Si Usted Necesita Algo Despus de Su Visita  Tambin puede enviarnos un mensaje a travs de MyChart. Por lo general respondemos a los mensajes de MyChart en el transcurso de 1 a 2 das hbiles.  Para renovar recetas, por favor pida a su farmacia que se ponga en contacto con nuestra oficina. Nuestro nmero de fax es el [phone].  Si tiene un asunto urgente cuando la clnica est cerrada y que no puede esperar hasta el siguiente da hbil, puede llamar/localizar a su doctor(a) al nmero que aparece a continuacin.   Por favor, tenga en cuenta que aunque hacemos todo lo posible para estar disponibles para asuntos urgentes fuera del horario de oficina, no estamos  disponibles las 24 horas del da, los 7 das de la semana.   Si tiene un problema urgente y no puede comunicarse con nosotros, puede optar por buscar atencin mdica  en el consultorio de su doctor(a), en una clnica privada, en un centro de atencin urgente o en una sala de emergencias.  Si tiene una emergencia mdica, por favor llame inmediatamente al 911 o vaya a la sala de emergencias.  Nmeros de bper  - Dr. Kowalski: [phone]  - Dra. Moye: [phone]  - Dra. Stewart: [phone]  En caso de inclemencias del tiempo, por favor llame a nuestra lnea principal al [phone] para una actualizacin sobre el estado de cualquier retraso o cierre.  Consejos para la medicacin en dermatologa: Por favor, guarde las cajas en las que vienen los medicamentos de uso tpico para ayudarle a seguir las instrucciones sobre dnde y cmo usarlos. Las farmacias generalmente imprimen las instrucciones del medicamento slo en   las cajas y no directamente en los tubos del medicamento.   Si su medicamento es muy caro, por favor, pngase en contacto con nuestra oficina llamando al [phone] y presione la opcin 4 o envenos un mensaje a travs de MyChart.   No podemos decirle cul ser su copago por los medicamentos por adelantado ya que esto es diferente dependiendo de la cobertura de su seguro. Sin embargo, es posible que podamos encontrar un medicamento sustituto a menor costo o llenar un formulario para que el seguro cubra el medicamento que se considera necesario.   Si se requiere una autorizacin previa para que su compaa de seguros cubra su medicamento, por favor permtanos de 1 a 2 das hbiles para completar este proceso.  Los precios de los medicamentos varan con frecuencia dependiendo del lugar de dnde se surte la receta y alguna farmacias pueden ofrecer precios ms baratos.  El sitio web www.goodrx.com tiene cupones para medicamentos de diferentes farmacias. Los precios aqu no  tienen en cuenta lo que podra costar con la ayuda del seguro (puede ser ms barato con su seguro), pero el sitio web puede darle el precio si no utiliz ningn seguro.  - Puede imprimir el cupn correspondiente y llevarlo con su receta a la farmacia.  - Tambin puede pasar por nuestra oficina durante el horario de atencin regular y recoger una tarjeta de cupones de GoodRx.  - Si necesita que su receta se enve electrnicamente a una farmacia diferente, informe a nuestra oficina a travs de MyChart de [facility] o por telfono llamando al [phone] y presione la opcin 4.  

## 2022-01-03 NOTE — Progress Notes (Signed)
Follow-Up Visit   Subjective  Andrew Obrien is a 87 y.o. male who presents for the following: Psoriasis (6 month follow up - Otezla 30 mg 1 po bid- No headaches, diarrhea, weight loss or depression/), Follow-up (Biopsy follow up of right ear post inf - BCC treated with EDC), and Other (Spots of face that bleed).  The following portions of the chart were reviewed this encounter and updated as appropriate:   Tobacco  Allergies  Meds  Problems  Med Hx  Surg Hx  Fam Hx     Review of Systems:  No other skin or systemic complaints except as noted in HPI or Assessment and Plan.  Objective  Well appearing patient in no apparent distress; mood and affect are within normal limits.  A focused examination was performed including face, hands. Relevant physical exam findings are noted in the Assessment and Plan.  Right infra auricular Erythematous stuck-on, waxy papule or plaque     Right ear post inf Well healed EDC site  Left nose Erythematous thin papules/macules with gritty scale.      Left mandible 1.0 cm hyperkeratotic papule     Hands          Assessment & Plan  Inflamed seborrheic keratosis Right infra auricular Symptomatic, irritating, patient would like treated. Destruction of lesion - Right infra auricular Complexity: simple   Destruction method: cryotherapy   Informed consent: discussed and consent obtained   Timeout:  patient name, date of birth, surgical site, and procedure verified Lesion destroyed using liquid nitrogen: Yes   Region frozen until ice ball extended beyond lesion: Yes   Outcome: patient tolerated procedure well with no complications   Post-procedure details: wound care instructions given    History of basal cell carcinoma (BCC) Right ear post inf Clear. Observe for recurrence. Call clinic for new or changing lesions.  Recommend regular skin exams, daily broad-spectrum spf 30+ sunscreen use, and photoprotection.    AK  (actinic keratosis) Left nose Destruction of lesion - Left nose Complexity: simple   Destruction method: cryotherapy   Informed consent: discussed and consent obtained   Timeout:  patient name, date of birth, surgical site, and procedure verified Lesion destroyed using liquid nitrogen: Yes   Region frozen until ice ball extended beyond lesion: Yes   Outcome: patient tolerated procedure well with no complications   Post-procedure details: wound care instructions given    Neoplasm of uncertain behavior of skin Left mandible Epidermal / dermal shaving Lesion diameter (cm):  1 Informed consent: discussed and consent obtained   Timeout: patient name, date of birth, surgical site, and procedure verified   Procedure prep:  Patient was prepped and draped in usual sterile fashion Prep type:  Isopropyl alcohol Anesthesia: the lesion was anesthetized in a standard fashion   Anesthetic:  1% lidocaine w/ epinephrine 1-100,000 buffered w/ 8.4% NaHCO3 Instrument used: flexible razor blade   Hemostasis achieved with: pressure, aluminum chloride and electrodesiccation   Outcome: patient tolerated procedure well   Post-procedure details: sterile dressing applied and wound care instructions given   Dressing type: bandage and petrolatum    Destruction of lesion Complexity: extensive  - defect 1.4 cm Destruction method: electrodesiccation and curettage   Informed consent: discussed and consent obtained   Timeout:  patient name, date of birth, surgical site, and procedure verified Procedure prep:  Patient was prepped and draped in usual sterile fashion Prep type:  Isopropyl alcohol Anesthesia: the lesion was anesthetized in a standard fashion  Anesthetic:  1% lidocaine w/ epinephrine 1-100,000 buffered w/ 8.4% NaHCO3 Curettage performed in three different directions: Yes   Electrodesiccation performed over the curetted area: Yes   Hemostasis achieved with:  pressure and aluminum chloride Outcome:  patient tolerated procedure well with no complications   Post-procedure details: sterile dressing applied and wound care instructions given   Dressing type: bandage and petrolatum    Specimen 1 - Surgical pathology Differential Diagnosis: SCC vs other  Check Margins: No EDC today  Psoriasis Hands Psoriasis - severe on systemic "biologic" treatment injections.  Psoriasis is a chronic non-curable, but treatable genetic/hereditary disease that may have other systemic features affecting other organ systems such as joints (Psoriatic Arthritis).  It is linked with heart disease, inflammatory bowel disease, non-alcoholic fatty liver disease, and depression. Significant skin psoriasis and/or psoriatic arthritis may have significant symptoms and affects activities of daily activity and often benefits from systemic "biologic" injection treatments.  These "biologic" treatments have some potential side effects including immunosuppression and require pre-treatment laboratory screening and periodic laboratory monitoring and periodic in person evaluation and monitoring by the attending dermatologist physician (long term medication management). Chronic and persistent condition with duration or expected duration over one year. Condition is symptomatic / bothersome to patient. Not to goal, but improved.  Continue Otezla 30 mg 1 po bid  Related Medications Apremilast (OTEZLA) 30 MG TABS Take 1 tablet (30 mg total) by mouth 2 (two) times daily.  Actinic Damage - chronic, secondary to cumulative UV radiation exposure/sun exposure over time - diffuse scaly erythematous macules with underlying dyspigmentation - Recommend daily broad spectrum sunscreen SPF 30+ to sun-exposed areas, reapply every 2 hours as needed.  - Recommend staying in the shade or wearing long sleeves, sun glasses (UVA+UVB protection) and wide brim hats (4-inch brim around the entire circumference of the hat). - Call for new or changing  lesions.  Return in about 9 weeks (around 03/07/2022) for Follow up AK, ISK, biopsy follow up.  I, Ashok Cordia, CMA, am acting as scribe for Sarina Ser, MD . Documentation: I have reviewed the above documentation for accuracy and completeness, and I agree with the above.  Sarina Ser, MD

## 2022-01-08 ENCOUNTER — Telehealth: Payer: Self-pay

## 2022-01-08 NOTE — Telephone Encounter (Signed)
-----   Message from Ralene Bathe, MD sent at 01/07/2022  6:14 PM EDT ----- Diagnosis Skin , left mandible BASAL CELL CARCINOMA, NODULAR PATTERN, BASE INVOLVED  Cancer - BCC Already treated Recheck next visit

## 2022-01-08 NOTE — Telephone Encounter (Signed)
Left message on voicemail to return my call.  

## 2022-01-09 ENCOUNTER — Telehealth: Payer: Self-pay

## 2022-01-09 NOTE — Telephone Encounter (Signed)
Advised patient of biopsy results/hd

## 2022-01-09 NOTE — Telephone Encounter (Signed)
-----   Message from Ralene Bathe, MD sent at 01/07/2022  6:14 PM EDT ----- Diagnosis Skin , left mandible BASAL CELL CARCINOMA, NODULAR PATTERN, BASE INVOLVED  Cancer - BCC Already treated Recheck next visit

## 2022-01-15 ENCOUNTER — Encounter: Payer: Self-pay | Admitting: Dermatology

## 2022-01-16 ENCOUNTER — Other Ambulatory Visit: Payer: Self-pay

## 2022-01-16 DIAGNOSIS — L409 Psoriasis, unspecified: Secondary | ICD-10-CM

## 2022-01-16 MED ORDER — OTEZLA 30 MG PO TABS: 30.0000 mg | ORAL_TABLET | Freq: Two times a day (BID) | ORAL | 3 refills | Status: DC

## 2022-01-16 NOTE — Progress Notes (Signed)
Refill request faxed from [facility] in

## 2022-03-07 ENCOUNTER — Ambulatory Visit: Payer: Medicare Other | Admitting: Dermatology

## 2022-04-29 ENCOUNTER — Ambulatory Visit
Payer: Medicare Other | Attending: Student in an Organized Health Care Education/Training Program | Admitting: Student in an Organized Health Care Education/Training Program

## 2022-04-29 ENCOUNTER — Encounter: Payer: Self-pay | Admitting: Student in an Organized Health Care Education/Training Program

## 2022-04-29 VITALS — BP 123/65 | HR 86 | Temp 97.8°F | Resp 18 | Ht 72.0 in | Wt 219.0 lb

## 2022-04-29 DIAGNOSIS — M12811 Other specific arthropathies, not elsewhere classified, right shoulder: Secondary | ICD-10-CM

## 2022-04-29 DIAGNOSIS — G8929 Other chronic pain: Secondary | ICD-10-CM | POA: Insufficient documentation

## 2022-04-29 DIAGNOSIS — G5681 Other specified mononeuropathies of right upper limb: Secondary | ICD-10-CM

## 2022-04-29 DIAGNOSIS — M25511 Pain in right shoulder: Secondary | ICD-10-CM | POA: Diagnosis present

## 2022-04-29 DIAGNOSIS — M75101 Unspecified rotator cuff tear or rupture of right shoulder, not specified as traumatic: Secondary | ICD-10-CM | POA: Diagnosis present

## 2022-04-29 NOTE — Progress Notes (Signed)
PROVIDER NOTE: Information contained herein reflects review and annotations entered in association with encounter. Interpretation of such information and data should be left to medically-trained personnel. Information provided to patient can be located elsewhere in the medical record under "Patient Instructions". Document created using STT-dictation technology, any transcriptional errors that may result from process are unintentional.    Patient: Andrew Obrien  Service Category: E/M  Provider: Gillis Santa, MD  DOB: March 30, 1935  DOS: 04/29/2022  Referring Provider: Gladstone Lighter, MD  MRN: [id number]  Specialty: Interventional Pain Management  PCP: Gladstone Lighter, MD  Type: Established Patient  Setting: Ambulatory outpatient    Location: Office  Delivery: Face-to-face     HPI  Andrew Obrien, a 87 y.o. 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 Andrew Obrien primary complain today is Shoulder Pain (Right ) Last encounter: My last encounter with him was on 09/19/20 Pertinent problems: Andrew Obrien has Status post total replacement of hip; History of total right knee replacement (x2); Failed back surgical syndrome; Bilateral primary osteoarthritis of knee; Neuropathic pain; Spinal cord stimulator status; Chronic pain syndrome; CKD (chronic kidney disease) stage 3, GFR 30-59 ml/min (HCC); and Diabetic neuropathy (HCC) on their pertinent problem list. Pain Assessment: Severity of Chronic pain is reported as a 9 /10. Location: Shoulder Right/Denies. Onset: More than a month ago (1 year). Quality: Constant, Hervey Ard ("feels like there is a needle in my shoulder"). Timing: Constant. Modifying factor(s): Denies. Vitals:  height is 6' (1.829 m) and weight is 219 lb (99.3 kg). His temporal temperature is 97.8 F (36.6 C). His blood pressure is 123/65 and his pulse is 86. His respiration is 18 and oxygen saturation is 97%.   Reason for encounter: new  problems.  Patient presents today with right shoulder pain that is worse with shoulder abduction.  He has a history of right shoulder arthropathy and rotator cuff dysfunction.  He has symptoms consistent with suprascapular nerve irritation.  He says his pain is most prominent along his posterior shoulder region.  No inciting or traumatic event.  Pain has been going on for the last 6 months but has been getting worse.  He feels like there is a needle in his shoulder.  We discussed a right suprascapular nerve block.  Risk and benefits reviewed and patient like to proceed.  He tries to incorporate stretching and strengthening exercises for his right shoulder but he states it has not been helpful in managing his pain.  ROS  Constitutional: Denies any fever or chills Gastrointestinal: No reported hemesis, hematochezia, vomiting, or acute GI distress Musculoskeletal: Right shoulder pain Neurological: No reported episodes of acute onset apraxia, aphasia, dysarthria, agnosia, amnesia, paralysis, loss of coordination, or loss of consciousness  Medication Review  Apremilast, Azelastine-Fluticasone, DULoxetine, Glucosamine-Chondroit-Vit C-Mn, NON FORMULARY, allopurinol, ascorbic acid, calcium carbonate, cholecalciferol, fenofibrate, ketoconazole, melatonin, metFORMIN, mirabegron ER, multivitamin with minerals, mupirocin ointment, olmesartan-hydrochlorothiazide, omeprazole, pregabalin, rOPINIRole, rosuvastatin, tamsulosin, temazepam, and traZODone  History Review  Allergy: Andrew Obrien has No Known Allergies. Drug: Andrew Obrien  reports no history of drug use. Alcohol:  reports no history of alcohol use. Tobacco:  reports that he has never smoked. He has never used smokeless tobacco. Social: Andrew Obrien  reports that he has never smoked. He has never used smokeless tobacco. He reports that he does not drink alcohol and does not use drugs. Medical:  has a past medical history of Arthritis, Basal cell  carcinoma (08/21/2020), Basal cell carcinoma (07/28/2021), Basal cell carcinoma (01/03/2022),  Depression, Diabetes mellitus without complication ([place]), Difficult intubation, GERD (gastroesophageal reflux disease), Heart murmur, basal cell carcinoma (2001, 2002), basal cell carcinoma (2004), basal cell carcinoma (2005), basal cell carcinoma (2006), basal cell carcinoma (2008), basal cell carcinoma (2009), basal cell carcinoma (2015), basal cell carcinoma (2016), basal cell carcinoma (2017), basal cell carcinoma (2020), Hypertension, Macular degeneration, Melanoma ([place]) (1973), Neuropathy, OSA on CPAP, Prostate cancer ([place]), Psoriasis, and Squamous cell carcinoma of skin (2006). Surgical: Mr. Catena  has a past surgical history that includes Pain pump implantation; Cataract extraction w/ intraocular lens  implant, bilateral; Total shoulder replacement (Left); Rotator cuff repair (Left); Lumbar spine surgery; Mouth surgery; prostate cancer; Skin cancer excision; Replacement total knee (Right); and Total hip arthroplasty (Right, 04/14/2017). Family: family history is not on file.  Laboratory Chemistry Profile   Renal Lab Results  Component Value Date   BUN 23 05/24/2020   CREATININE 2.20 (H) 09/12/2021   GFRAA >60 04/16/2017   GFRNONAA 45 (L) 05/24/2020    Hepatic Lab Results  Component Value Date   AST 24 05/23/2020   ALT 18 05/23/2020   ALBUMIN 3.5 05/23/2020   ALKPHOS 34 (L) 05/23/2020    Electrolytes Lab Results  Component Value Date   NA 138 05/24/2020   K 4.9 05/24/2020   CL 105 05/24/2020   CALCIUM 9.1 05/24/2020    Bone No results found for: "VD25OH", "VD125OH2TOT", "UT6546TK3", "TW6568LE7", "25OHVITD1", "25OHVITD2", "51ZGYFVC9", "TESTOFREE", "TESTOSTERONE"  Inflammation (CRP: Acute Phase) (ESR: Chronic Phase) Lab Results  Component Value Date   CRP 1.1 (H) 04/02/2017   ESRSEDRATE 25 (H) 04/02/2017         Note: Above Lab results reviewed.  Recent Imaging Review  DG UGI  W SMALL BOWEL CLINICAL DATA:  Provided history: Chronic diarrhea. Pharyngoesophageal dysphagia. Generalized abdominal pain. Dyspepsia. Change in bowel habits. Weight loss. Evaluate for esophageal stricture, Zenker's diverticulum, mass lesion, dysmotility, small-bowel abnormality.  EXAM: UPPER GI SERIES WITH SMALL BOWEL FOLLOW-THROUGH  FLUOROSCOPY: Fluoroscopy time: 4 minutes, 18 seconds (134.70 mGy).  TECHNIQUE: A combined double contrast and single contrast upper GI series using effervescent crystals, thick barium, and thin barium was performed. Subsequently, serial images of the small bowel were obtained including spot views of the terminal ileum.  COMPARISON:  Chest CT 11/21/2021. CT abdomen/pelvis 09/12/2021. Modified barium swallow 11/29/2021.  FINDINGS: A scout radiograph of the abdomen was acquired. Nonobstructive bowel gas pattern. Spinal stimulator device with leads extending to the thoracic spine. Prior right total hip arthroplasty. Lumbar spondylosis and dextrocurvature.  Fluoroscopic evaluation demonstrates normal caliber and smooth contour of the esophagus. No evidence of fixed stricture, mass or mucosal abnormality. Mild-to-moderate intermittent esophageal dysmotility with tertiary contractions. Small sliding hiatal hernia. No gastroesophageal reflux observed. A swallowed 13 mm barium tablet freely passed into the stomach.  Thickened appearance of the gastric folds, suggesting chronic gastritis. 1.6 cm diverticulum arising at the junction of the second and third portions of the duodenum. Otherwise unremarkable appearance of the duodenal bulb and duodenal sweep.  Apart from underdistention of the terminal ileum, unremarkable appearance of the remaining small bowel. No small bowel mass or stricture identified.  The examination was performed by Pasty Spillers, PA-C, and was supervised and interpreted by Dr. Kellie Simmering.  IMPRESSION: 1. Small sliding hiatal  hernia. 2. Mild-to-moderate intermittent esophageal dysmotility with tertiary contractions. 3. Prominence of the gastric folds, suggesting chronic gastritis. 4. 1.6 cm diverticulum arising at the junction of the second and third portions of the duodenum. 5. Apart from underdistension of the  terminal ileum, unremarkable appearance of the remaining small bowel. No small bowel mass or stricture identified.  Electronically Signed   By: Kellie Simmering D.O.   On: 12/27/2021 15:38 Note: Reviewed        Physical Exam  General appearance: Well nourished, well developed, and well hydrated. In no apparent acute distress Mental status: Alert, oriented x 3 (person, place, & time)       Respiratory: No evidence of acute respiratory distress Eyes: PERLA Vitals: BP 123/65   Pulse 86   Temp 97.8 F (36.6 C) (Temporal)   Resp 18   Ht 6' (1.829 m)   Wt 219 lb (99.3 kg)   SpO2 97%   BMI 29.70 kg/m  BMI: Estimated body mass index is 29.7 kg/m as calculated from the following:   Height as of this encounter: 6' (1.829 m).   Weight as of this encounter: 219 lb (99.3 kg). Ideal: Ideal body weight: 77.6 kg (171 lb 1.2 oz) Adjusted ideal body weight: 86.3 kg (190 lb 3.9 oz)  Upper Extremity (UE) Exam    Side: Right upper extremity  Side: Left upper extremity  Skin & Extremity Inspection: Skin color, temperature, and hair growth are WNL. No peripheral edema or cyanosis. No masses, redness, swelling, asymmetry, or associated skin lesions. No contractures.  Skin & Extremity Inspection: Skin color, temperature, and hair growth are WNL. No peripheral edema or cyanosis. No masses, redness, swelling, asymmetry, or associated skin lesions. No contractures.  Functional ROM: Pain restricted ROM for shoulder and elbow  Functional ROM: Unrestricted ROM          Muscle Tone/Strength: Functionally intact. No obvious neuro-muscular anomalies detected.  Muscle Tone/Strength: Functionally intact. No obvious  neuro-muscular anomalies detected.  Sensory (Neurological): Neurogenic pain pattern          Sensory (Neurological): Unimpaired          Palpation: No palpable anomalies              Palpation: No palpable anomalies              Provocative Test(s):  Phalen's test: deferred Tinel's test: deferred Apley's scratch test (touch opposite shoulder):  Action 1 (Across chest): Decreased ROM Action 2 (Overhead): Decreased ROM Action 3 (LB reach): Decreased ROM   Provocative Test(s):  Phalen's test: deferred Tinel's test: deferred Apley's scratch test (touch opposite shoulder):  Action 1 (Across chest): deferred Action 2 (Overhead): deferred Action 3 (LB reach): deferred     Assessment   Diagnosis Status  1. Suprascapular neuropathy, right   2. Chronic right shoulder pain   3. Right rotator cuff tear arthropathy    Worsening Worsening Worsening   Updated Problems: Problem  Suprascapular Neuropathy, Right  Right Rotator Cuff Tear Arthropathy  Chronic Right Shoulder Pain     Plan of Care   Orders:  Orders Placed This Encounter  Procedures   SUPRASCAPULAR NERVE BLOCK    For shoulder pain.    Standing Status:   Future    Standing Expiration Date:   07/30/2022    Scheduling Instructions:     Purpose: Diagnostic     Laterality: RIGHT     Level(s): Suprascapular notch     Sedation: without     Scheduling Timeframe: As permitted by the schedule    Order Specific Question:   Where will this procedure be performed?    Answer:   [facility]   Follow-up plan:   Return in about 2 weeks (around 05/13/2022)  for Right SSNB , in clinic NS.    Recent Visits No visits were found meeting these conditions. Showing recent visits within past 90 days and meeting all other requirements Today's Visits Date Type Provider Dept  04/29/22 Office Visit Gillis Santa, MD [facility]  Showing today's visits and meeting all other requirements Future Appointments Date Type  Provider Dept  05/20/22 Appointment Gillis Santa, MD [facility]  Showing future appointments within next 90 days and meeting all other requirements  I discussed the assessment and treatment plan with the patient. The patient was provided an opportunity to ask questions and all were answered. The patient agreed with the plan and demonstrated an understanding of the instructions.  Patient advised to call back or seek an in-person evaluation if the symptoms or condition worsens.  Duration of encounter: 59mnutes.  Total time on encounter, as per AMA guidelines included both the face-to-face and non-face-to-face time personally spent by the physician and/or other qualified health care professional(s) on the day of the encounter (includes time in activities that require the physician or other qualified health care professional and does not include time in activities normally performed by clinical staff). Physician's time may include the following activities when performed: preparing to see the patient (eg, review of tests, pre-charting review of records) obtaining and/or reviewing separately obtained history performing a medically appropriate examination and/or evaluation counseling and educating the patient/family/caregiver ordering medications, tests, or procedures referring and communicating with other health care professionals (when not separately reported) documenting clinical information in the electronic or other health record independently interpreting results (not separately reported) and communicating results to the patient/ family/caregiver care coordination (not separately reported)  Note by: BGillis Santa MD Date: 04/29/2022; Time: 2:20 PM

## 2022-05-15 ENCOUNTER — Other Ambulatory Visit: Payer: Self-pay

## 2022-05-15 DIAGNOSIS — L409 Psoriasis, unspecified: Secondary | ICD-10-CM

## 2022-05-15 MED ORDER — OTEZLA 30 MG PO TABS: 30.0000 mg | ORAL_TABLET | Freq: Two times a day (BID) | ORAL | 0 refills | Status: AC

## 2022-05-15 NOTE — Progress Notes (Signed)
Refill request faxed from [facility]

## 2022-05-20 ENCOUNTER — Ambulatory Visit
Admission: RE | Admit: 2022-05-20 | Discharge: 2022-05-20 | Disposition: A | Discharge status: Home / Self Care | Payer: Medicare Other | Source: Ambulatory Visit | Attending: Student in an Organized Health Care Education/Training Program | Admitting: Student in an Organized Health Care Education/Training Program

## 2022-05-20 ENCOUNTER — Ambulatory Visit
Payer: Medicare Other | Attending: Student in an Organized Health Care Education/Training Program | Admitting: Student in an Organized Health Care Education/Training Program

## 2022-05-20 ENCOUNTER — Encounter: Payer: Self-pay | Admitting: Student in an Organized Health Care Education/Training Program

## 2022-05-20 VITALS — BP 151/81 | HR 77 | Temp 98.1°F | Resp 18 | Ht 72.0 in | Wt 220.0 lb

## 2022-05-20 DIAGNOSIS — G8929 Other chronic pain: Secondary | ICD-10-CM | POA: Insufficient documentation

## 2022-05-20 DIAGNOSIS — M75101 Unspecified rotator cuff tear or rupture of right shoulder, not specified as traumatic: Secondary | ICD-10-CM | POA: Diagnosis present

## 2022-05-20 DIAGNOSIS — G5681 Other specified mononeuropathies of right upper limb: Secondary | ICD-10-CM | POA: Diagnosis present

## 2022-05-20 DIAGNOSIS — M25511 Pain in right shoulder: Secondary | ICD-10-CM | POA: Insufficient documentation

## 2022-05-20 DIAGNOSIS — M12811 Other specific arthropathies, not elsewhere classified, right shoulder: Secondary | ICD-10-CM | POA: Diagnosis present

## 2022-05-20 MED ORDER — DEXAMETHASONE SODIUM PHOSPHATE 10 MG/ML IJ SOLN
10.0000 mg | Freq: Once | INTRAMUSCULAR | Status: AC
Start: 2022-05-20 — End: 2022-05-20
  Administered 2022-05-20: 10 mg
  Filled 2022-05-20: qty 1

## 2022-05-20 MED ORDER — ROPIVACAINE HCL 2 MG/ML IJ SOLN
4.0000 mL | Freq: Once | INTRAMUSCULAR | Status: AC
  Administered 2022-05-20: 4 mL via INTRA_ARTICULAR
  Filled 2022-05-20: qty 20

## 2022-05-20 MED ORDER — LIDOCAINE HCL 2 % IJ SOLN
20.0000 mL | Freq: Once | INTRAMUSCULAR | Status: AC
Start: 2022-05-20 — End: 2022-05-20
  Administered 2022-05-20: 400 mg
  Filled 2022-05-20: qty 40

## 2022-05-20 MED ORDER — IOHEXOL 180 MG/ML  SOLN
10.0000 mL | Freq: Once | INTRAMUSCULAR | Status: AC
Start: 2022-05-20 — End: 2022-05-20
  Administered 2022-05-20: 10 mL via INTRA_ARTICULAR
  Filled 2022-05-20: qty 20

## 2022-05-20 NOTE — Patient Instructions (Signed)

## 2022-05-20 NOTE — Progress Notes (Signed)
PROVIDER NOTE: Interpretation of information contained herein should be left to medically-trained personnel. Specific patient instructions are provided elsewhere under "Patient Instructions" section of medical record. This document was created in part using STT-dictation technology, any transcriptional errors that may result from this process are unintentional.  Patient: Andrew Obrien Type: Established DOB: [birth year] MRN: [id number] PCP: Andrew Lighter, MD  Service: Procedure DOS: 05/20/2022 Setting: Ambulatory Location: Ambulatory outpatient facility Delivery: Face-to-face Provider: Gillis Santa, MD Specialty: Interventional Pain Management Specialty designation: 09 Location: Outpatient facility Ref. Prov.: Andrew Lighter, MD    Primary Reason for Visit: Interventional Pain Management Treatment. CC: Back Pain (Right shoulder) and Shoulder Pain  Procedure:           Type: Suprascapular nerve block (SSNB) #1  Laterality:  Right Level: Superior to scapular spine, lateral to supraspinatus fossa (Suprascapular notch).  Imaging: Fluoroscopic guidance         Anesthesia: Local anesthesia (1-2% Lidocaine) DOS: 05/20/2022  Performed by: Gillis Santa, MD  Purpose: Diagnostic/Therapeutic Indications: Shoulder pain, severe enough to impact quality of life and/or function. 1. Suprascapular neuropathy, right   2. Chronic right shoulder pain   3. Right rotator cuff tear arthropathy    NAS-11 score:   Pre-procedure: 7 /10   Post-procedure: 7 /10     Target: Suprascapular nerve Location: midway between the medial border of the scapula and the acromion as it runs through the suprascapular notch. Region: Suprascapular, posterior shoulder  Approach: Percutaneous  Neuroanatomy: The suprascapular nerve is the lateral branch of the superior trunk of the brachial plexus. It receives nerve fibers that originate in the nerve roots C5 and C6 (and sometimes C4). It is a mixed nerve, meaning  that it provides both sensory and motor supply for the suprascapular region. Function: The main function of this nerve is to provide motor innervation for two muscles, the supraspinatus and infraspinatus muscles. They are part of the rotator cuff muscles. In addition, the suprascapular nerve provides a sensory supply to the joints of the scapula (glenohumeral and acromioclavicular joints). Rationale (medical necessity): procedure needed and proper for the diagnosis and/or treatment of the patient's medical symptoms and needs.  Position / Prep / Materials:  Position: Prone Materials:  Tray: Block Needle(s):  Type: Spinal  Gauge (G): 22  Length: 3.5 in.  Qty: 1 Prep solution: DuraPrep (Iodine Povacrylex [0.7% available iodine] and Isopropyl Alcohol, 74% w/w) Prep Area: Entire posterior shoulder area. From upper spine to shoulder proper (upper arm), and from lateral neck to lower tip of shoulder blade.   Pre-op H&P Assessment:  Andrew Obrien is a 87 y.o. (year old), male patient, seen today for interventional treatment. 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 Andrew Obrien has a current medication list which includes the following prescription(s): allopurinol, otezla, azelastine-fluticasone, calcium carbonate, cholecalciferol, duloxetine, fenofibrate, glucosamine-chondroit-vit c-mn, ketoconazole, melatonin, metformin, mirabegron er, multivitamin with minerals, mupirocin ointment, NON FORMULARY, olmesartan-hydrochlorothiazide, omeprazole, ropinirole, rosuvastatin, tamsulosin, temazepam, trazodone, ascorbic acid, and pregabalin. His primarily concern today is the Back Pain (Right shoulder) and Shoulder  Pain  Initial Vital Signs:  Pulse/HCG Rate: 77ECG Heart Rate: 72 Temp: 98.1 F (36.7 C) Resp: 18 BP: (!) 136/57 (Dr Holley Raring notified) SpO2: 98 %  BMI: Estimated body mass index is 29.84 kg/m as calculated  from the following:   Height as of this encounter: 6' (1.829 m).   Weight as of this encounter: 220 lb (99.8 kg).  Risk Assessment: Allergies: Reviewed. He has No Known Allergies.  Allergy Precautions: None required Coagulopathies: Reviewed. None identified.  Blood-thinner therapy: None at this time Active Infection(s): Reviewed. None identified. Andrew Obrien is afebrile  Site Confirmation: Andrew Obrien was asked to confirm the procedure and laterality before marking the site Procedure checklist: Completed Consent: Before the procedure and under the influence of no sedative(s), amnesic(s), or anxiolytics, the patient was informed of the treatment options, risks and possible complications. To fulfill our ethical and legal obligations, as recommended by the American Medical Association's Code of Ethics, I have informed the patient of my clinical impression; the nature and purpose of the treatment or procedure; the risks, benefits, and possible complications of the intervention; the alternatives, including doing nothing; the risk(s) and benefit(s) of the alternative treatment(s) or procedure(s); and the risk(s) and benefit(s) of doing nothing. The patient was provided information about the general risks and possible complications associated with the procedure. These may include, but are not limited to: failure to achieve desired goals, infection, bleeding, organ or nerve damage, allergic reactions, paralysis, and death. In addition, the patient was informed of those risks and complications associated to the procedure, such as failure to decrease pain; infection; bleeding; organ or nerve damage with subsequent damage to sensory, motor, and/or autonomic systems, resulting in permanent pain,  numbness, and/or weakness of one or several areas of the body; allergic reactions; (i.e.: anaphylactic reaction); and/or death. Furthermore, the patient was informed of those risks and complications associated with the medications. These include, but are not limited to: allergic reactions (i.e.: anaphylactic or anaphylactoid reaction(s)); adrenal axis suppression; blood sugar elevation that in diabetics may result in ketoacidosis or comma; water retention that in patients with history of congestive heart failure may result in shortness of breath, pulmonary edema, and decompensation with resultant heart failure; weight gain; swelling or edema; medication-induced neural toxicity; particulate matter embolism and blood vessel occlusion with resultant organ, and/or nervous system infarction; and/or aseptic necrosis of one or more joints. Finally, the patient was informed that Medicine is not an exact science; therefore, there is also the possibility of unforeseen or unpredictable risks and/or possible complications that may result in a catastrophic outcome. The patient indicated having understood very clearly. We have given the patient no guarantees and we have made no promises. Enough time was given to the patient to ask questions, all of which were answered to the patient's satisfaction. Mr. Slyter has indicated that he wanted to continue with the procedure. Attestation: I, the ordering provider, attest that I have discussed with the patient the benefits, risks, side-effects, alternatives, likelihood of achieving goals, and potential problems during recovery for the procedure that I have provided informed consent. Date  Time: 05/20/2022  9:42 AM  Pre-Procedure Preparation:  Monitoring: As per clinic protocol. Respiration, ETCO2, SpO2, BP, heart rate and rhythm monitor placed and checked for adequate function Safety Precautions: Patient was assessed for positional comfort and pressure points before starting  the procedure. Time-out: I initiated and conducted the "Time-out" before starting the procedure, as per protocol. The patient was asked to participate by confirming the accuracy of the "Time Out" information. Verification of the correct person, site, and procedure were performed and confirmed by me, the nursing staff, and the patient. "Time-out" conducted as per Joint Commission's Universal Protocol (UP.01.01.01). Time: 1030  Description of Procedure:  Procedural Technique Safety Precautions: Aspiration looking for blood return was conducted prior to all injections. At no point did we inject any substances, as a needle was being advanced. No attempts were made at seeking any paresthesias. Safe injection practices and needle disposal techniques used. Medications properly checked for expiration dates. SDV (single dose vial) medications used. Description of the Procedure: Protocol guidelines were followed. The patient was placed in position over the procedure table. The target area was identified and the area prepped in the usual manner. Skin & deeper tissues infiltrated with local anesthetic. Appropriate amount of time allowed to pass for local anesthetics to take effect. The procedure needles were then advanced to the target area. Proper needle placement secured. Negative aspiration confirmed. Solution injected in intermittent fashion, asking for systemic symptoms every 0.5cc of injectate. The needles were then removed and the area cleansed, making sure to leave some of the prepping solution back to take advantage of its long term bactericidal properties.   5 cc solution made of 4 cc of 0.2% ropivacaine, 1 cc of Decadron 10 mg/cc.  Injected along the right suprascapular nerve after contrast confirmation.    Vitals:   05/20/22 0948 05/20/22 1024  BP: (!) 136/57 (!) 151/81  Pulse: 77   Resp:  18  Temp: 98.1 F (36.7 C)   TempSrc: Temporal   SpO2: 98% 95%  Weight: 220 lb (99.8 kg)   Height:  6' (1.829 m)      Start Time: 1030 hrs. End Time: 1033 hrs.  Imaging Guidance (Spinal):          Type of Imaging Technique: Fluoroscopy Guidance (non Spinal) Indication(s): Assistance in needle guidance and placement for procedures requiring needle placement in or near specific anatomical locations not easily accessible without such assistance. Exposure Time: Please see nurses notes. Contrast: Before injecting any contrast, we confirmed that the patient did not have an allergy to iodine, shellfish, or radiological contrast. Once satisfactory needle placement was completed at the desired level, radiological contrast was injected. Contrast injected under live fluoroscopy. No contrast complications. See chart for type and volume of contrast used. Fluoroscopic Guidance: I was personally present during the use of fluoroscopy. "Tunnel Vision Technique" used to obtain the best possible view of the target area. Parallax error corrected before commencing the procedure. "Direction-depth-direction" technique used to introduce the needle under continuous pulsed fluoroscopy. Once target was reached, antero-posterior, oblique, and lateral fluoroscopic projection used confirm needle placement in all planes. Images permanently stored in EMR. Interpretation: I personally interpreted the imaging intraoperatively. Adequate needle placement confirmed in multiple planes. Appropriate spread of contrast into desired area was observed. No evidence of afferent or efferent intravascular uptake. No intrathecal or subarachnoid spread observed. Permanent images saved into the patient's record.  Antibiotic Prophylaxis:   Anti-infectives (From admission, onward)    None      Indication(s): None identified  Post-operative Assessment:  Post-procedure Vital Signs:  Pulse/HCG Rate: 7772 Temp: 98.1 F (36.7 C) Resp: 18 BP: (!) 151/81 SpO2: 95 %  EBL: None  Complications: No immediate post-treatment complications  observed by team, or reported by patient.  Note: The patient tolerated the entire procedure well. A repeat set of vitals were taken after the procedure and the patient was kept under observation following institutional policy, for this type of procedure. Post-procedural neurological assessment was performed, showing return to baseline, prior to discharge. The patient was provided with post-procedure discharge instructions, including a section on how to identify potential problems. Should any problems  arise concerning this procedure, the patient was given instructions to immediately contact us, at any time, without hesitation. In any case, we plan to contact the patient by telephone for a follow-up status report regarding this interventional procedure.  Comments:  No additional relevant information.  Plan of Care  Orders:  Orders Placed This Encounter  Procedures   DG [facility] C-ARM 1-60 MIN NO REPORT    Intraoperative interpretation by procedural physician at [facility].    Standing Status:   Standing    Number of Occurrences:   1    Order Specific Question:   Reason for exam:    Answer:   Assistance in needle guidance and placement for procedures requiring needle placement in or near specific anatomical locations not easily accessible without such assistance.     Medications ordered for procedure: Meds ordered this encounter  Medications   iohexol (OMNIPAQUE) 180 MG/ML injection 10 mL    Must be Myelogram-compatible. If not available, you may substitute with a water-soluble, non-ionic, hypoallergenic, myelogram-compatible radiological contrast medium.   lidocaine (XYLOCAINE) 2 % (with pres) injection 400 mg   dexamethasone (DECADRON) injection 10 mg   ropivacaine (PF) 2 mg/mL (0.2%) (NAROPIN) injection 4 mL   Medications administered: We administered iohexol, lidocaine, dexamethasone, and ropivacaine (PF) 2 mg/mL (0.2%).  See the medical record for exact dosing, route,  and time of administration.  Follow-up plan:   Return in about 5 weeks (around 06/24/2022) for PPE.      Recent Visits Date Type Provider Dept  04/29/22 Office Visit Gillis Santa, MD [facility]  Showing recent visits within past 90 days and meeting all other requirements Today's Visits Date Type Provider Dept  05/20/22 Procedure visit Gillis Santa, MD [facility]  Showing today's visits and meeting all other requirements Future Appointments Date Type Provider Dept  07/01/22 Appointment Gillis Santa, MD [facility]  Showing future appointments within next 90 days and meeting all other requirements  Disposition: Discharge home  Discharge (Date  Time): 05/20/2022; 1045 hrs.   Primary Care Physician: Andrew Lighter, MD Location: [facility] Note by: Gillis Santa, MD Date: 05/20/2022; Time: 11:00 AM  Disclaimer:  Medicine is not an exact science. The only guarantee in medicine is that nothing is guaranteed. It is important to note that the decision to proceed with this intervention was based on the information collected from the patient. The Data and conclusions were drawn from the patient's questionnaire, the interview, and the physical examination. Because the information was provided in large part by the patient, it cannot be guaranteed that it has not been purposely or unconsciously manipulated. Every effort has been made to obtain as much relevant data as possible for this evaluation. It is important to note that the conclusions that lead to this procedure are derived in large part from the available data. Always take into account that the treatment will also be dependent on availability of resources and existing treatment guidelines, considered by other Pain Management Practitioners as being common knowledge and practice, at the time of the intervention. For Medico-Legal purposes, it is also important to point out that  variation in procedural techniques and pharmacological choices are the acceptable norm. The indications, contraindications, technique, and results of the above procedure should only be interpreted and judged by a Board-Certified Interventional Pain Specialist with extensive familiarity and expertise in the same exact procedure and technique.

## 2022-05-20 NOTE — Progress Notes (Signed)
Safety precautions to be maintained throughout the outpatient stay will include: orient to surroundings, keep bed in low position, maintain call bell within reach at all times, provide assistance with transfer out of bed and ambulation.  

## 2022-05-21 ENCOUNTER — Telehealth: Payer: Self-pay

## 2022-05-21 NOTE — Telephone Encounter (Signed)
Post procedure phone call.  Patient states he is doing good.  

## 2022-07-01 ENCOUNTER — Ambulatory Visit: Payer: Medicare Other | Admitting: Student in an Organized Health Care Education/Training Program

## 2022-08-05 ENCOUNTER — Ambulatory Visit: Payer: Medicare Other | Admitting: Dermatology

## 2022-08-19 ENCOUNTER — Ambulatory Visit: Payer: Medicare Other | Admitting: Dermatology
